# Patient Record
Sex: Female | Born: 1975 | Race: Black or African American | Hispanic: No | Smoking: Never smoker
Health system: Southern US, Community
[De-identification: ages and names within clinical notes are randomized; demographics above are authoritative.]

## PROBLEM LIST (undated history)

## (undated) DIAGNOSIS — F32A Depression, unspecified: Secondary | ICD-10-CM

## (undated) DIAGNOSIS — G43909 Migraine, unspecified, not intractable, without status migrainosus: Secondary | ICD-10-CM

## (undated) DIAGNOSIS — I639 Cerebral infarction, unspecified: Secondary | ICD-10-CM

## (undated) DIAGNOSIS — M722 Plantar fascial fibromatosis: Secondary | ICD-10-CM

## (undated) DIAGNOSIS — O009 Unspecified ectopic pregnancy without intrauterine pregnancy: Secondary | ICD-10-CM

## (undated) DIAGNOSIS — F419 Anxiety disorder, unspecified: Secondary | ICD-10-CM

## (undated) DIAGNOSIS — F4310 Post-traumatic stress disorder, unspecified: Secondary | ICD-10-CM

## (undated) DIAGNOSIS — F329 Major depressive disorder, single episode, unspecified: Secondary | ICD-10-CM

## (undated) DIAGNOSIS — G08 Intracranial and intraspinal phlebitis and thrombophlebitis: Secondary | ICD-10-CM

## (undated) DIAGNOSIS — M1990 Unspecified osteoarthritis, unspecified site: Secondary | ICD-10-CM

## (undated) HISTORY — DX: Cerebral infarction, unspecified: I63.9

---

## 1999-12-16 HISTORY — PX: ECTOPIC PREGNANCY SURGERY: SHX613

## 2000-10-15 HISTORY — PX: ECTOPIC PREGNANCY SURGERY: SHX613

## 2002-12-15 HISTORY — PX: ECTOPIC PREGNANCY SURGERY: SHX613

## 2004-01-16 HISTORY — PX: ECTOPIC PREGNANCY SURGERY: SHX613

## 2013-12-15 HISTORY — PX: COLONOSCOPY: SHX174

## 2014-03-27 ENCOUNTER — Encounter (HOSPITAL_COMMUNITY): Payer: Self-pay | Admitting: Emergency Medicine

## 2014-03-27 ENCOUNTER — Emergency Department (HOSPITAL_COMMUNITY)
Admission: EM | Admit: 2014-03-27 | Discharge: 2014-03-28 | Disposition: A | Discharge status: Other Acute Inpatient Hospital | Attending: Emergency Medicine | Admitting: Emergency Medicine

## 2014-03-27 DIAGNOSIS — R45851 Suicidal ideations: Secondary | ICD-10-CM

## 2014-03-27 DIAGNOSIS — F419 Anxiety disorder, unspecified: Secondary | ICD-10-CM

## 2014-03-27 DIAGNOSIS — F329 Major depressive disorder, single episode, unspecified: Secondary | ICD-10-CM

## 2014-03-27 DIAGNOSIS — F411 Generalized anxiety disorder: Secondary | ICD-10-CM | POA: Insufficient documentation

## 2014-03-27 DIAGNOSIS — Z3202 Encounter for pregnancy test, result negative: Secondary | ICD-10-CM | POA: Insufficient documentation

## 2014-03-27 DIAGNOSIS — Z8679 Personal history of other diseases of the circulatory system: Secondary | ICD-10-CM | POA: Insufficient documentation

## 2014-03-27 DIAGNOSIS — F32A Depression, unspecified: Secondary | ICD-10-CM

## 2014-03-27 DIAGNOSIS — A5903 Trichomonal cystitis and urethritis: Secondary | ICD-10-CM | POA: Insufficient documentation

## 2014-03-27 DIAGNOSIS — Z8739 Personal history of other diseases of the musculoskeletal system and connective tissue: Secondary | ICD-10-CM | POA: Insufficient documentation

## 2014-03-27 DIAGNOSIS — F8089 Other developmental disorders of speech and language: Secondary | ICD-10-CM | POA: Insufficient documentation

## 2014-03-27 DIAGNOSIS — F3289 Other specified depressive episodes: Secondary | ICD-10-CM | POA: Insufficient documentation

## 2014-03-27 HISTORY — DX: Major depressive disorder, single episode, unspecified: F32.9

## 2014-03-27 HISTORY — DX: Unspecified osteoarthritis, unspecified site: M19.90

## 2014-03-27 HISTORY — DX: Depression, unspecified: F32.A

## 2014-03-27 HISTORY — DX: Anxiety disorder, unspecified: F41.9

## 2014-03-27 HISTORY — DX: Unspecified ectopic pregnancy without intrauterine pregnancy: O00.90

## 2014-03-27 HISTORY — DX: Migraine, unspecified, not intractable, without status migrainosus: G43.909

## 2014-03-27 HISTORY — DX: Post-traumatic stress disorder, unspecified: F43.10

## 2014-03-27 MED ORDER — ONDANSETRON HCL 4 MG PO TABS: 4.0000 mg | ORAL_TABLET | Freq: Three times a day (TID) | ORAL | Status: DC | PRN

## 2014-03-27 MED ORDER — ALUM & MAG HYDROXIDE-SIMETH 200-200-20 MG/5ML PO SUSP: 30.0000 mL | ORAL | Status: DC | PRN

## 2014-03-27 MED ORDER — LORAZEPAM 1 MG PO TABS
1.0000 mg | ORAL_TABLET | Freq: Once | ORAL | Status: AC
  Administered 2014-03-28: 1 mg via ORAL
  Filled 2014-03-27: qty 1

## 2014-03-27 MED ORDER — ZOLPIDEM TARTRATE 5 MG PO TABS: 5.0000 mg | ORAL_TABLET | Freq: Every evening | ORAL | Status: DC | PRN

## 2014-03-27 MED ORDER — IBUPROFEN 200 MG PO TABS: 600.0000 mg | ORAL_TABLET | Freq: Three times a day (TID) | ORAL | Status: DC | PRN

## 2014-03-27 MED ORDER — NICOTINE 21 MG/24HR TD PT24: 21.0000 mg | MEDICATED_PATCH | Freq: Every day | TRANSDERMAL | Status: DC

## 2014-03-27 NOTE — ED Notes (Signed)
Patient states that she lost her licensee earlier today and then paniced - she repeatedly states that she is done and she is tired. The patient report SI denies any HI

## 2014-03-27 NOTE — ED Provider Notes (Signed)
CSN: [id number]     Arrival date & time 03/27/14  2258 History   First MD Initiated Contact with Patient 03/27/14 2305     This chart was scribed for non-physician practitioner, Antony MaduraKelly Enrico Eaddy, PA-C  working with Shon Batonourtney F Horton, MD by Arlan OrganAshley Leger, ED Scribe. This patient was seen in room WTR4/WLPT4 and the patient's care was started at 11:29 PM.   Chief Complaint  Patient presents with  . Panic Attack   HPI  HPI Comments: Jasmine [name] is a 38 y.o. Female with a PMHx of PTSD, depression, and mental issues who presents to the Emergency Department complaining of a panic attack that started a few hours prior to arrival.  She states this episode was brought on by a situation that occurred earlier today after getting her licensed taken away from her. Pt states she is "tired" and feels "confused". She currently lives with her Mother. Mother states she retired from [employer] about 3 weeks ago and was diagnosed with severe depression while active. Pt is not legally separated from her husband, but Mother states they are no longer together. She has been having some difficulty finding a job and looking for a new home. Pt states she stopped taking her medication in February. States her husband told her she was on an "emotional roller coaster". She has not been seen recently by a mental health provider; [place] follow up was in [place].[facility] but denies any HI at this time. Denies any recent alcohol or drug use. She admits she attempted suicide several years ago by consuming a large amount of prescribed medication. No other pertinent past medical history. No other concerns this visit.   Past Medical History  Diagnosis Date  . Ectopic pregnancy   . Migraine   . Osteoarthritis   . Anxiety   . PTSD (post-traumatic stress disorder)   . Depression    History reviewed. No pertinent past surgical history. No family history on file. History  Substance Use Topics  . Smoking status: Never Smoker   .  Smokeless tobacco: Not on file  . Alcohol Use: No   OB History   Grav Para Term Preterm Abortions TAB SAB Ect Mult Living                  Review of Systems  Constitutional: Negative for fever and chills.  HENT: Negative for congestion.   Eyes: Negative for redness.  Respiratory: Negative for cough.   Skin: Negative for rash.  Psychiatric/Behavioral: Positive for suicidal ideas. The patient is nervous/anxious.      Allergies  Review of patient's allergies indicates no known allergies.  Home Medications   Current Outpatient Rx  Name  Route  Sig  Dispense  Refill  . ibuprofen (ADVIL,MOTRIN) 200 MG tablet   Oral   Take 400 mg by mouth every 6 (six) hours as needed (pain).           Triage Vitals: BP 126/99  Pulse 88  Temp(Src) 98 F (36.7 C) (Oral)  Resp 18    Physical Exam  Nursing note and vitals reviewed. Constitutional: She is oriented to person, place, and time. She appears well-developed and well-nourished. No distress.  HENT:  Head: Normocephalic and atraumatic.  Eyes: Conjunctivae and EOM are normal. No scleral icterus.  Neck: Normal range of motion.  Cardiovascular: Normal rate, regular rhythm and normal heart sounds.   Pulmonary/Chest: Effort normal and breath sounds normal. No respiratory distress. She has no wheezes. She  has no rales.  Abdominal: Soft. She exhibits no distension. There is no tenderness. There is no rebound.  Musculoskeletal: Normal range of motion.  Neurological: She is alert and oriented to person, place, and time.  Skin: Skin is warm and dry. No rash noted. She is not diaphoretic. No erythema. No pallor.  Psychiatric: Her speech is delayed. She is withdrawn. She exhibits a depressed mood. She expresses suicidal ideation. She expresses no homicidal ideation. She expresses no suicidal plans and no homicidal plans.  Patient quiet, reserved, and tearful.    ED Course  Procedures (including critical care time)  COORDINATION OF  CARE: 11:37 PM-Discussed treatment plan with pt at bedside and pt agreed to plan.     Labs Review Labs Reviewed  COMPREHENSIVE METABOLIC PANEL - Abnormal; Notable for the following:    Potassium 3.4 (*)    GFR calc non Af Amer 90 (*)    All other components within normal limits  URINALYSIS, ROUTINE W REFLEX MICROSCOPIC - Abnormal; Notable for the following:    APPearance CLOUDY (*)    Hgb urine dipstick LARGE (*)    Leukocytes, UA MODERATE (*)    All other components within normal limits  SALICYLATE LEVEL - Abnormal; Notable for the following:    Salicylate Lvl <2.0 (*)    All other components within normal limits  URINE MICROSCOPIC-ADD ON - Abnormal; Notable for the following:    Squamous Epithelial / LPF FEW (*)    Bacteria, UA FEW (*)    All other components within normal limits  CBC WITH DIFFERENTIAL  PREGNANCY, URINE  ETHANOL  URINE RAPID DRUG SCREEN (HOSP PERFORMED)  ACETAMINOPHEN LEVEL   Imaging Review No results found.   EKG Interpretation None      MDM   Final diagnoses:  Depression  Suicidal ideations  Anxiety  Trichomonal urethritis    38 year old female presents for anxiety, depression, and suicidal ideations. Patient tearful and withdrawn on initial presentation. Workup today significant for trichomonal urethritis. Patient treated for this in the ED. She is otherwise medically cleared. Patient has been accepted at behavioral health for inpatient psychiatric care by Dr. Elsie SaasJonnalagadda. EMTALA completed and patient stable for transfer.  I personally performed the services described in this documentation, which was scribed in my presence. The recorded information has been reviewed and is accurate.    Antony MaduraKelly Sareena Odeh, PA-C 03/28/14 [phone]

## 2014-03-28 ENCOUNTER — Encounter (HOSPITAL_COMMUNITY): Payer: Self-pay | Admitting: *Deleted

## 2014-03-28 ENCOUNTER — Inpatient Hospital Stay (HOSPITAL_COMMUNITY)
Admission: EM | Admit: 2014-03-28 | Discharge: 2014-04-03 | DRG: 885 | Disposition: A | Discharge status: Home / Self Care | Source: Intra-hospital | Attending: Psychiatry | Admitting: Psychiatry

## 2014-03-28 DIAGNOSIS — G4700 Insomnia, unspecified: Secondary | ICD-10-CM | POA: Diagnosis present

## 2014-03-28 DIAGNOSIS — F411 Generalized anxiety disorder: Secondary | ICD-10-CM | POA: Diagnosis present

## 2014-03-28 DIAGNOSIS — F332 Major depressive disorder, recurrent severe without psychotic features: Principal | ICD-10-CM

## 2014-03-28 DIAGNOSIS — F329 Major depressive disorder, single episode, unspecified: Secondary | ICD-10-CM

## 2014-03-28 DIAGNOSIS — F4310 Post-traumatic stress disorder, unspecified: Secondary | ICD-10-CM

## 2014-03-28 DIAGNOSIS — F410 Panic disorder [episodic paroxysmal anxiety] without agoraphobia: Secondary | ICD-10-CM | POA: Diagnosis present

## 2014-03-28 DIAGNOSIS — G4710 Hypersomnia, unspecified: Secondary | ICD-10-CM | POA: Diagnosis present

## 2014-03-28 DIAGNOSIS — R45851 Suicidal ideations: Secondary | ICD-10-CM

## 2014-03-28 DIAGNOSIS — M1990 Unspecified osteoarthritis, unspecified site: Secondary | ICD-10-CM | POA: Diagnosis present

## 2014-03-28 LAB — URINALYSIS, ROUTINE W REFLEX MICROSCOPIC
Bilirubin Urine: NEGATIVE
Glucose, UA: NEGATIVE mg/dL
Ketones, ur: NEGATIVE mg/dL
NITRITE: NEGATIVE
PROTEIN: NEGATIVE mg/dL
Specific Gravity, Urine: 1.015 (ref 1.005–1.030)
Urobilinogen, UA: 0.2 mg/dL (ref 0.0–1.0)
pH: 6 (ref 5.0–8.0)

## 2014-03-28 LAB — COMPREHENSIVE METABOLIC PANEL
ALT: 18 U/L (ref 0–35)
AST: 18 U/L (ref 0–37)
Albumin: 4.1 g/dL (ref 3.5–5.2)
Alkaline Phosphatase: 70 U/L (ref 39–117)
BILIRUBIN TOTAL: 0.3 mg/dL (ref 0.3–1.2)
BUN: 12 mg/dL (ref 6–23)
CHLORIDE: 101 meq/L (ref 96–112)
CO2: 22 mEq/L (ref 19–32)
Calcium: 9.7 mg/dL (ref 8.4–10.5)
Creatinine, Ser: 0.81 mg/dL (ref 0.50–1.10)
GFR calc non Af Amer: 90 mL/min — ABNORMAL LOW (ref >90)
GLUCOSE: 95 mg/dL (ref 70–99)
POTASSIUM: 3.4 meq/L — AB (ref 3.7–5.3)
Sodium: 138 mEq/L (ref 137–147)
Total Protein: 8.2 g/dL (ref 6.0–8.3)

## 2014-03-28 LAB — CBC WITH DIFFERENTIAL/PLATELET
BASOS PCT: 0 % (ref 0–1)
Basophils Absolute: 0 10*3/uL (ref 0.0–0.1)
Eosinophils Absolute: 0.2 10*3/uL (ref 0.0–0.7)
Eosinophils Relative: 3 % (ref 0–5)
HCT: 39.3 % (ref 36.0–46.0)
HEMOGLOBIN: 13.6 g/dL (ref 12.0–15.0)
LYMPHS ABS: 3.5 10*3/uL (ref 0.7–4.0)
LYMPHS PCT: 45 % (ref 12–46)
MCH: 30.6 pg (ref 26.0–34.0)
MCHC: 34.6 g/dL (ref 30.0–36.0)
MCV: 88.5 fL (ref 78.0–100.0)
MONOS PCT: 4 % (ref 3–12)
Monocytes Absolute: 0.3 10*3/uL (ref 0.1–1.0)
NEUTROS ABS: 3.7 10*3/uL (ref 1.7–7.7)
Neutrophils Relative %: 48 % (ref 43–77)
Platelets: 321 10*3/uL (ref 150–400)
RBC: 4.44 MIL/uL (ref 3.87–5.11)
RDW: 13.5 % (ref 11.5–15.5)
WBC: 7.7 10*3/uL (ref 4.0–10.5)

## 2014-03-28 LAB — URINE MICROSCOPIC-ADD ON

## 2014-03-28 LAB — SALICYLATE LEVEL

## 2014-03-28 LAB — PREGNANCY, URINE: Preg Test, Ur: NEGATIVE

## 2014-03-28 LAB — RAPID URINE DRUG SCREEN, HOSP PERFORMED
Amphetamines: NOT DETECTED
BARBITURATES: NOT DETECTED
Benzodiazepines: NOT DETECTED
COCAINE: NOT DETECTED
Opiates: NOT DETECTED
TETRAHYDROCANNABINOL: NOT DETECTED

## 2014-03-28 LAB — ACETAMINOPHEN LEVEL

## 2014-03-28 LAB — ETHANOL: Alcohol, Ethyl (B): <11 mg/dL (ref 0–11)

## 2014-03-28 MED ORDER — METRONIDAZOLE 500 MG PO TABS
2000.0000 mg | ORAL_TABLET | Freq: Once | ORAL | Status: AC
  Administered 2014-03-28: 2000 mg via ORAL
  Filled 2014-03-28: qty 4

## 2014-03-28 MED ORDER — ALUM & MAG HYDROXIDE-SIMETH 200-200-20 MG/5ML PO SUSP: 30.0000 mL | ORAL | Status: DC | PRN

## 2014-03-28 MED ORDER — CELECOXIB 100 MG PO CAPS
200.0000 mg | ORAL_CAPSULE | Freq: Every day | ORAL | Status: DC
  Administered 2014-03-28 – 2014-04-03 (×7): 200 mg via ORAL
  Filled 2014-03-28: qty 1
  Filled 2014-03-28: qty 28
  Filled 2014-03-28 (×2): qty 1
  Filled 2014-03-28: qty 2
  Filled 2014-03-28 (×5): qty 1

## 2014-03-28 MED ORDER — MAGNESIUM HYDROXIDE 400 MG/5ML PO SUSP: 30.0000 mL | Freq: Every day | ORAL | Status: DC | PRN

## 2014-03-28 MED ORDER — TRAZODONE HCL 50 MG PO TABS
50.0000 mg | ORAL_TABLET | Freq: Every evening | ORAL | Status: DC | PRN
  Administered 2014-03-28 – 2014-03-31 (×5): 50 mg via ORAL
  Filled 2014-03-28 (×17): qty 1

## 2014-03-28 MED ORDER — ESCITALOPRAM OXALATE 10 MG PO TABS
10.0000 mg | ORAL_TABLET | Freq: Every day | ORAL | Status: DC
  Administered 2014-03-28 – 2014-04-03 (×7): 10 mg via ORAL
  Filled 2014-03-28 (×8): qty 1
  Filled 2014-03-28: qty 14
  Filled 2014-03-28 (×2): qty 1

## 2014-03-28 MED ORDER — HYDROXYZINE HCL 25 MG PO TABS
25.0000 mg | ORAL_TABLET | Freq: Four times a day (QID) | ORAL | Status: DC | PRN
  Administered 2014-04-01 – 2014-04-02 (×2): 25 mg via ORAL
  Filled 2014-03-28 (×2): qty 1

## 2014-03-28 MED ORDER — ACETAMINOPHEN 325 MG PO TABS: 650.0000 mg | ORAL_TABLET | Freq: Four times a day (QID) | ORAL | Status: DC | PRN

## 2014-03-28 NOTE — Progress Notes (Signed)
The focus of this group is to educate the patient on the purpose and policies of crisis stabilization and provide a format to answer questions about their admission.  The group details unit policies and expectations of patients while admitted. Patient did not attend. 

## 2014-03-28 NOTE — Tx Team (Signed)
Initial Interdisciplinary Treatment Plan  PATIENT STRENGTHS: (choose at least two) Average or above average intelligence Capable of independent living General fund of knowledge Supportive family/friends  PATIENT STRESSORS: Financial difficulties Marital or family conflict Medication change or noncompliance Occupational concerns   PROBLEM LIST: Problem List/Patient Goals Date to be addressed Date deferred Reason deferred Estimated date of resolution  Depression-separation from husband x 1 month      Feeling overwhelmed, not able to find a job-"I'm on an emotional roller coaster"      Risk for self harm                                           DISCHARGE CRITERIA:  Improved stabilization in mood, thinking, and/or behavior Motivation to continue treatment in a less acute level of care Need for constant or close observation no longer present Reduction of life-threatening or endangering symptoms to within safe limits Verbal commitment to aftercare and medication compliance  PRELIMINARY DISCHARGE PLAN: Attend aftercare/continuing care group Attend PHP/IOP Outpatient therapy Return to previous living arrangement  PATIENT/FAMIILY INVOLVEMENT: This treatment plan has been presented to and reviewed with the patient, Jasmine [name], and/or family member.  The patient and family have been given the opportunity to ask questions and make suggestions.  [name] [name] 03/28/2014, 6:00 AM

## 2014-03-28 NOTE — [facility] Suicide Risk Assessment (Signed)
   Nursing information obtained from:  Patient;Review of record Demographic factors:  Low socioeconomic status;Unemployed (separated from husband) Current Mental Status:  Self-harm thoughts Loss Factors:  Decrease in vocational status;Loss of significant relationship;Financial problems / change in socioeconomic status Historical Factors:  Prior suicide attempts Risk Reduction Factors:  Living with another person, especially a relative;Positive social support Total Time spent with patient: 45 minutes  CLINICAL FACTORS:   Severe Anxiety and/or Agitation Panic Attacks Depression:   Anhedonia Hopelessness Impulsivity Insomnia Recent sense of peace/wellbeing Severe Unstable or Poor Therapeutic Relationship Previous Psychiatric Diagnoses and Treatments Medical Diagnoses and Treatments/Surgeries  Psychiatric Specialty Exam: Physical Exam  ROS  Blood pressure 107/71, pulse 82, temperature 98.2 F (36.8 C), temperature source Oral, resp. rate 18, height 5\' 1"  (1.549 m), weight 79.606 kg (175 lb 8 oz), last menstrual period 03/23/2014.Body mass index is 33.18 kg/(m^2).  General Appearance: Disheveled and Guarded  Eye Contact::  Minimal  Speech:  Slow  Volume:  Decreased  Mood:  Anxious, Depressed, Hopeless and Worthless  Affect:  Depressed and Restricted  Thought Process:  Intact  Orientation:  Full (Time, Place, and Person)  Thought Content:  Rumination  Suicidal Thoughts:  Yes.  without intent/plan  Homicidal Thoughts:  No  Memory:  Immediate;   Good  Judgement:  Impaired  Insight:  Lacking  Psychomotor Activity:  Psychomotor Retardation  Concentration:  Fair  Recall:  Fair  Fund of Knowledge:Fair  Language: Good  Akathisia:  NA  Handed:  Right  AIMS (if indicated):     Assets:  Communication Skills Desire for Improvement Financial Resources/Insurance Housing Leisure Time Physical Health Resilience Social Support Talents/Skills Transportation  Sleep:       Musculoskeletal: Strength & Muscle Tone: within normal limits Gait & Station: normal Patient leans: N/A  COGNITIVE FEATURES THAT CONTRIBUTE TO RISK:  Closed-mindedness Loss of executive function Polarized thinking    SUICIDE RISK:   Moderate:  Frequent suicidal ideation with limited intensity, and duration, some specificity in terms of plans, no associated intent, good self-control, limited dysphoria/symptomatology, some risk factors present, and identifiable protective factors, including available and accessible social support.  PLAN OF CARE: Admit for crisis stabilization, safety monitoring and medication management of depression and posttraumatic stress disorder.   I certify that inpatient services furnished can reasonably be expected to improve the patient's condition.  Jasmine [name] 03/28/2014, 1:22 PM

## 2014-03-28 NOTE — ED Notes (Signed)
Mother: Velma SwazilandJordan    [phone] Sister Debbe OdeaLatisha stacker [phone] Password: Maurice Marchallahboy

## 2014-03-28 NOTE — Clinical Social Work Note (Signed)
Writer met with patient's sister in lobby to gather collateral information and to provide SPE.  Patient husband was waiting in lobby but sister advised CSW was not given consent to talk with husband. 

## 2014-03-28 NOTE — ED Notes (Signed)
Report called to RN Erskine SquibbJane, [facility].  Pending [facility] transport.
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
## 2014-03-28 NOTE — Progress Notes (Signed)
Recreation Therapy Notes  Animal-Assisted Activity/Therapy (AAA/T) Program Checklist/Progress Notes Patient Eligibility Criteria Checklist & Daily Group note for Rec Tx Intervention  Date: 04.14.2015 Time: 2:45am Location: 500 Hall Dayroom    AAA/T Program Assumption of Risk Form signed by Patient/ or Parent Legal Guardian yes  Patient is free of allergies or sever asthma yes  Patient reports no fear of animals yes  Patient reports no history of cruelty to animals yes   Patient understands his/her participation is voluntary yes  Behavioral Response: Did not attend.   Dao Memmott L Audriana Aldama, LRT/CTRS  Dash Cardarelli L Hadlei Stitt 03/28/2014 4:55 PM 

## 2014-03-28 NOTE — [facility] Group Notes (Signed)
[facility] LCSW Group Therapy      Feelings About Diagnosis 1:15 - 2:30 PM         03/28/2014  3:34 PM    Type of Therapy:  Group Therapy  Participation Level: did not attend group.  Wynn BankerHodnett, Taneshia Lorence Hairston 03/28/2014  3:34 PM

## 2014-03-28 NOTE — Progress Notes (Signed)
D. Pt visible and in room for much of the the afternoon, did have visit from family members this evening. Pt appears depressed and sad. Pt did receive medication without incident and pt did not complain of any pain this evening. A. Support and encouragement provided, medication education provided. R. Pt verbalized understanding, safety maintained.

## 2014-03-28 NOTE — [facility] Suicide Risk Assessment (Signed)
[facility] INPATIENT:  Family/Significant Other Suicide Prevention Education  Suicide Prevention Education:  Education Completed; Jasmine [name], Sister, [facility] Lobby of [facility] [phone] has been identified by the patient as the family member/significant other with whom the patient will be residing, and identified as the person(s) who will aid the patient in the event of a mental health crisis (suicidal ideations/suicide attempt).  With written consent from the patient, the family member/significant other has been provided the following suicide prevention education, prior to the and/or following the discharge of the patient.  The suicide prevention education provided includes the following:  Suicide risk factors  Suicide prevention and interventions  National Suicide Hotline telephone number  [facility] assessment telephone number  [facility] Emergency Assistance 911  [facility] and/or Residential Mobile Crisis Unit telephone number  Request made of family/significant other to:  Remove weapons (e.g., guns, rifles, knives), all items previously/currently identified as safety concern.  Sister advised patient does not have access to weapons.   Remove drugs/medications (over-the-counter, prescriptions, illicit drugs), all items previously/currently identified as a safety concern.  The family member/significant other verbalizes understanding of the suicide prevention education information provided.  The family member/significant other agrees to remove the items of safety concern listed above.  Jasmine [name] 03/28/2014, 1:16 PM

## 2014-03-28 NOTE — Progress Notes (Signed)
Vol admit to the 500 hall after presenting to the ED with depression and expressing SI to overdose.  Pt says she has attempted suicide in the past by overdose when she was stationed in [place] while in the [employer].  Pt says she has been feeling tired and confused.  Pt says she has been separated from her husband about a month.  Pt is living with her mother.  Pt retired from the [employer] about 3 weeks ago after serving in [employer] for 20 yrs.  Pt says she has been unable to find a job.  She feels like she is on an emotional roller coaster.  Her depression and frustration had been building until she could find her license and she "just lost it".  Pt says she still feels suicidal, but can contract for safety to come to staff if she has thoughts to hurt herself.  Pt was cooperative with the admission and paperwork was signed.  Pt was quiet/withdrawn,but answered questions appropriately.  Pt denies substance abuse issues.  No major medical issues.  Pt reports a hx of three ectopic pregnancies in the early 2000s.  Pt was oriented to the unit.  Safety checks q15 minutes initiated.

## 2014-03-28 NOTE — ED Notes (Signed)
[facility] transport requested. 
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
## 2014-03-29 LAB — LIPID PANEL
CHOL/HDL RATIO: 2.8 ratio
Cholesterol: 160 mg/dL (ref 0–200)
HDL: 57 mg/dL (ref >39)
LDL CALC: 93 mg/dL (ref 0–99)
TRIGLYCERIDES: 51 mg/dL (ref <150)
VLDL: 10 mg/dL (ref 0–40)

## 2014-03-29 LAB — T4, FREE: Free T4: 1.14 ng/dL (ref 0.80–1.80)

## 2014-03-29 LAB — TSH: TSH: 2.05 u[IU]/mL (ref 0.350–4.500)

## 2014-03-29 NOTE — ED Provider Notes (Signed)
Medical screening examination/treatment/procedure(s) were performed by non-physician practitioner and as supervising physician I was immediately available for consultation/collaboration.   EKG Interpretation None        Courtney F Horton, MD 03/29/14 0442 
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
## 2014-03-29 NOTE — Progress Notes (Signed)
D: Patient denies SI/HI and auditory and visual hallucinations. The patient has a depressed mood and affect. The patient forwards little on the unit but is attending groups and interacting with peers appropriately.  A: Patient given emotional support from RN. Patient encouraged to come to staff with concerns and/or questions. Patient's medication routine continued. Patient's orders and plan of care reviewed.  R: Patient remains appropriate and cooperative. Will continue to monitor patient q15 minutes for safety.

## 2014-03-29 NOTE — Progress Notes (Signed)
Adult Psychoeducational Group Note  Date:  03/28/2014 Time:  11:40 PM  Group Topic/Focus:  Wrap-Up Group:   The focus of this group is to help patients review their daily goal of treatment and discuss progress on daily workbooks.  Participation Level:  Active  Participation Quality:  Appropriate  Affect:  Appropriate  Cognitive:  Appropriate  Insight: Appropriate  Engagement in Group:  Engaged  Modes of Intervention:  Discussion  Additional Comments:  Pt was present for wrap up group. Patient stated that she had not learned anything yet, she had just gotten here. She said that she saw her family today- her mother and step father. She said that they are positive supports for her.  Avanish Cerullo A Charlestine Rookstool 03/28/2014, 11:40 PM

## 2014-03-29 NOTE — [facility] Group Notes (Signed)
[facility] LCSW Group Therapy  Emotional Regulation 1:15 - 2: 30 PM        03/29/2014     Type of Therapy:  Group Therapy  Participation Level:  Appropriate  Participation Quality:  Appropriate  Affect:  Appropriate  Cognitive:  Attentive Appropriate  Insight:  Developing/Improving Engaged  Engagement in Therapy:  Developing/Improving Engaged  Modes of Intervention:  Discussion Exploration Problem-Solving Supportive  Summary of Progress/Problems:  Group topic was emotional regulations.  Patient participated in the discussion and was able to identify an emotion that needed to regulated.  She shared she has to learn how to control frustration due to setting high expectation from others and them not meeting that expectation.  Patient was able to identify approprite coping skills.  Wynn BankerHodnett, Leyana Whidden Hairston 03/29/2014

## 2014-03-29 NOTE — Progress Notes (Signed)
[facility] MD Progress Note  03/29/2014 1:43 PM Jasmine [name]  MRN:  [id number] Subjective:  Met with the patient to discuss her plan of care and response to treatment. The patient is up and active on the unit and attending groups. She reports she is better, but is not "where she needs to be in terms of symptom reduction."  Objective: Errica is alert and oriented on the unit, iterracting with her peers appropriately. She is making better eye contact, is less guarded, her voice is a little stronger, and she does actually smile today. She appears much more at ease in her surroundings and is asking about going home. 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 rate 16, height _0  (1.549 m), weight 79.606 kg (175 lb 8 oz), last menstrual period 03/23/2014.Body mass index is 33.18 kg/(m^2).  General Appearance: Fairly Groomed  Engineer, water::  Fair  Speech:  Clear and Coherent  Volume:  Normal  Mood:  Depressed  Affect:  Congruent  Thought Process:  Goal Directed  Orientation:  Full (Time, Place, and Person)  Thought Content:  WDL  Suicidal Thoughts:  Yes.  without intent/plan  Homicidal Thoughts:  No  Memory:  NA  Judgement:  Poor  Insight:  Present  Psychomotor Activity:  Decreased  Concentration:  Poor  Recall:  Poor  Fund of Knowledge:Fair  Language: Good  Akathisia:  No  Handed:  Right  AIMS (if indicated):     Assets:   Communication Skills Desire for Improvement Financial Resources/Insurance Physical Health Resilience Social Support  Sleep:      Musculoskeletal: Strength & Muscle Tone: within normal limits Gait & Station: normal Patient leans: N/A  Current Medications: Current Facility-Administered Medications  Medication Dose Route Frequency Provider Last Rate Last Dose  . acetaminophen (TYLENOL) tablet 650 mg  650 mg Oral Q6H PRN Laverle Hobby, PA-C      . alum & mag hydroxide-simeth (MAALOX/MYLANTA) 200-200-20 MG/5ML suspension 30 mL  30 mL Oral Q4H PRN Laverle Hobby, PA-C      . celecoxib (CELEBREX) capsule 200 mg  200 mg Oral Daily Nena Polio, PA-C   200 mg at 03/29/14 0831  . escitalopram (LEXAPRO) tablet 10 mg  10 mg Oral Daily Nena Polio, PA-C   10 mg at 03/29/14 5465  . hydrOXYzine (ATARAX/VISTARIL) tablet 25 mg  25 mg Oral Q6H PRN Laverle Hobby, PA-C      . magnesium hydroxide (MILK OF MAGNESIA) suspension 30 mL  30 mL Oral Daily PRN Laverle Hobby, PA-C      . traZODone (DESYREL) tablet 50 mg  50 mg Oral QHS,MR X 1 Laverle Hobby, PA-C   50 mg at 03/28/14 2129    Lab Results:  Results for orders placed during the hospital encounter of 03/28/14 (from the past 48 hour(s))  TSH     Status: None   Collection Time    03/29/14  6:35 AM      Result Value  Ref Range   TSH 2.050  0.350 - 4.500 uIU/mL   Comment: Please note change in reference range.     Performed at [facility]  LIPID PANEL     Status: None   Collection Time    03/29/14  6:35 AM      Result Value Ref Range   Cholesterol 160  0 - 200 mg/dL   Triglycerides 51  <150 mg/dL   HDL 57  >39 mg/dL   Total CHOL/HDL Ratio 2.8     VLDL 10  0 - 40 mg/dL   LDL Cholesterol 93  0 - 99 mg/dL   Comment:            Total Cholesterol/HDL:CHD Risk     Coronary Heart Disease Risk Table                         Men   Women      1/2 Average Risk   3.4   3.3      Average Risk       5.0   4.4      2 X Average Risk    9.6   7.1      3 X Average Risk  23.4   11.0                Use the calculated Patient Ratio     above and the CHD Risk Table     to determine the patient's CHD Risk.                ATP III CLASSIFICATION (LDL):      <100     mg/dL   Optimal      100-129  mg/dL   Near or Above                        Optimal      130-159  mg/dL   Borderline      160-189  mg/dL   High      >190     mg/dL   Very High     Performed at [facility]    Physical Findings: AIMS: Facial and Oral Movements Muscles of Facial Expression: None, normal Lips and Perioral Area: None, normal Jaw: None, normal Tongue: None, normal,Extremity Movements Upper (arms, wrists, hands, fingers): None, normal Lower (legs, knees, ankles, toes): None, normal, Trunk Movements Neck, shoulders, hips: None, normal, Overall Severity Severity of abnormal movements (highest score from questions above): None, normal Incapacitation due to abnormal movements: None, normal Patient's awareness of abnormal movements (rate only patient's report): No Awareness, Dental Status Current problems with teeth and/or dentures?: No Does patient usually wear dentures?: No  CIWA:    COWS:     Treatment Plan Summary: Daily contact with patient to assess and evaluate symptoms and progress in treatment Medication management  Plan:1. Continue crisis management and stabilization. 2. Medication management to reduce current symptoms to base line and improve the patient's overall level of functioning. 3. Treat health problems as indicated. 4. Develop treatment plan to decrease risk of relapse upon discharge and to reduce the need for readmission. 5. Psycho-social education regarding relapse prevention and self care. 6. Health care follow up as needed for medical problems. 7. Restart home medications where appropriate.   Medical Decision Making Problem Points:  Established problem, stable/improving (1) Data Points:  Review or order medicine  tests (1)  I certify that inpatient services furnished can reasonably be expected to improve the patient's condition.   Nena Polio 03/29/2014, 1:43 PM  Reviewed the information documented and agree with the treatment plan.  Parke Simmers Annalicia Renfrew 03/30/2014 3:41 PM

## 2014-03-29 NOTE — Progress Notes (Signed)
Adult Psychoeducational Group Note  Date:  03/29/2014 Time:  10:14 PM  Group Topic/Focus:  Wrap-Up Group:   The focus of this group is to help patients review their daily goal of treatment and discuss progress on daily workbooks.  Participation Level:  Minimal  Participation Quality:  Appropriate  Affect:  Appropriate  Cognitive:  Alert  Insight: Appropriate  Engagement in Group:  Lacking  Modes of Intervention:  Discussion  Additional Comments:  Pt stated that she had a better day. She has been able to process some things and talk with family. Her goal for tomorrow is to just take life one day at a time and enjoy the process.  Flonnie HailstoneShalonda R Afifa Truax 03/29/2014, 10:14 PM

## 2014-03-29 NOTE — Progress Notes (Signed)
Adult Psychoeducational Group Note  Date:  03/29/2014 Time:  10:00am Group Topic/Focus:  Personal Choices and Values:   The focus of this group is to help patients assess and explore the importance of values in their lives, how their values affect their decisions, how they express their values and what opposes their expression.  Participation Level:  Active  Participation Quality:  Appropriate and Attentive  Affect:  Appropriate  Cognitive:  Alert and Appropriate  Insight: Appropriate  Engagement in Group:  Engaged  Modes of Intervention:  Discussion and Education  Additional Comments:  Pt attended and participated in group. Discussion was on personal development. Pt was asked what did personal development to you? Pt stated it means cope with life and setting boundaries.  Pryor Curiaanya D Garner 03/29/2014, 2:11 PM

## 2014-03-30 NOTE — [facility] Group Notes (Signed)
[facility] LCSW Group Therapy  [facility] 1:15 - 2:30 PM  03/30/2014   Type of Therapy:  Group Therapy  Participation Level: Active  Participation Quality:  Attentive  Affect:  Appropriate  Cognitive:  Appropriate  Insight:  Developing/Improving and Engaged  Engagement in Therapy:  Developing/Improving Engaged  Modes of Intervention:  Discussion, Education, Exploration, Problem-Solving, Rapport Building, Support   Summary of Progress/Problems:  Patient listened attentively to speaker from [employer]. She asked questions about speaker's story and shared she is interested in participating with [facility].  Wynn BankerHodnett, Nat Lowenthal Hairston 03/30/2014 3:45 PM

## 2014-03-30 NOTE — Progress Notes (Signed)
D: Patient's affect and mood is depressed. She reported on the self inventory sheet that she's sleeping well, appetite is improving, energy level is low and ability to pay attention is good. Patient rates depression "6" and feelings of hopelessness "0". She's actively participating in groups throughout the day. Patient is compliant with medications.  A: Support and encouragement provided to patient. Administered scheduled medications per ordering MD. Monitor Q15 minute checks for safety.  R: Patient receptive. Denies SI/HI and auditory/visual hallucinations. Patient remains safe on the unit.

## 2014-03-30 NOTE — Progress Notes (Signed)
Recreation Therapy Notes  Animal-Assisted Activity/Therapy (AAA/T) Program Checklist/Progress Notes Patient Eligibility Criteria Checklist & Daily Group note for Rec Tx Intervention  Date: 04.16.2015 Time: 2:45pm Location: [address] Dayroom   AAA/T Program Assumption of Risk Form signed by Patient/ or Parent Legal Guardian yes  Patient is free of allergies or sever asthma yes  Patient reports no fear of animals yes  Patient reports no history of cruelty to animals yes   Patient understands his/her participation is voluntary yes  Patient washes hands before animal contact yes  Patient washes hands after animal contact yes  Behavioral Response: Appropriate   Education: Hand Washing, Appropriate Animal Interaction   Education Outcome: Acknowledges understanding  Clinical Observations/Feedback: Patient interacted appropriately with therapeutic dog team, peers and LRT.   Jayan Raymundo L Tay Whitwell, LRT/CTRS  Linard Daft L Yasamin Karel 03/30/2014 4:13 PM 

## 2014-03-30 NOTE — Progress Notes (Signed)
The patient attended group and was appropriate.  

## 2014-03-30 NOTE — Progress Notes (Signed)
Adult Psychoeducational Group Note  Date:  03/30/2014 Time:  9:00 AM  Group Topic/Focus:  Morning Wellness Group  Participation Level:  Active  Participation Quality:  Appropriate and Attentive  Affect:  Appropriate  Cognitive:  Alert and Oriented  Insight: Good  Engagement in Group:  Engaged  Modes of Intervention:  Discussion  Additional Comments: Patient participated in the warm-up exercises.  Melanee SpryRonecia E Byrd 03/30/2014, 11:46 AM

## 2014-03-30 NOTE — Progress Notes (Signed)
Adult Psychoeducational Group Note  Date:  03/30/2014 Time:  10:00am Group Topic/Focus:  Making Healthy Choices:   The focus of this group is to help patients identify negative/unhealthy choices they were using prior to admission and identify positive/healthier coping strategies to replace them upon discharge.  Participation Level:  Active  Participation Quality:  Appropriate and Attentive  Affect:  Appropriate  Cognitive:  Alert and Appropriate  Insight: Appropriate  Engagement in Group:  Engaged  Modes of Intervention:  Discussion and Education  Additional Comments:   Pt. Attended and participated in group. Group discussion was on Leisure and lifestyle changes. The question was asked what is one change you could make to improve your lifestyle? Pt stated to rethink things and come up with a new strategy.   Pryor Curiaanya D Garner 03/30/2014, 11:53 AM

## 2014-03-30 NOTE — Progress Notes (Signed)
D - Pt alert and oriented x 4, active on the unit interacting with peers and staff appropriately. Pt denies SI/HI or any auditory or visual hallucinations. Pt attending groups and actively participating.  A - Therapeutic conversation and emotional support offered. Encouraged pt to speak with staff about any questions or concerns. Medications given as ordered.  R - Pt safety maintained with Q 15 minute checks. Pt remains safe on the unit.

## 2014-03-30 NOTE — Progress Notes (Signed)
Patient at the window at the beginning of this shift. She denied SI/HI and denied Hallucinations. Patient's mood and affect seemed appropriate. She reported that she is feeling better, attending group and denied any problem.  Writer encouraged and supported patient. Patient receptive to encouragement and support. Q 15 minute check continues as ordered to maintain safety.

## 2014-03-30 NOTE — Progress Notes (Signed)
Patient ID: Jasmine [name], female   DOB: [birth year], 38 y.o.   MRN: [id number] [facility]  03/30/2014 1:51 PM Jasmine [name]  MRN:  [id number]  Subjective:  Patient was seen today face-to-face. Patient was admitted with diagnosis of major depressive disorder. Patient has been compliant with medication management and also unit activities including therapeutic groups. Patient reported she has no adverse effect of the medication. Patient reported she has a disturbance of sleep and appetite. Patient slowly improving her appetite and her sleep was better with medication. Patient minimizes her anxiety but continued to have a depression rated 3-4/10 today. Patient interacting with staff and her peers appropriately. She is making better eye contact, is less guarded, her voice is a little stronger, and has a bright affect. She appears much more at ease in her surroundings and is asking about going home.  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 rate 16, height 5\' 1"  (1.549 m), weight 79.606 kg (175 lb 8 oz), last menstrual period 03/23/2014.Body mass index is 33.18 kg/(m^2).  General Appearance: Fairly Groomed  Patent attorneyye Contact::  Fair  Speech:  Clear and Coherent  Volume:  Normal  Mood:  Depressed  Affect:  Congruent  Thought Process:  Goal Directed  Orientation:  Full (Time, Place, and Person)  Thought Content:  WDL   Suicidal Thoughts:  Yes.  without intent/plan  Homicidal Thoughts:  No  Memory:  NA  Judgement:  Poor  Insight:  Present  Psychomotor Activity:  Decreased  Concentration:  Poor  Recall:  Poor  Fund of Knowledge:Fair  Language: Good  Akathisia:  No  Handed:  Right  AIMS (if indicated):     Assets:  Communication Skills Desire for Improvement Financial Resources/Insurance Physical Health Resilience Social Support  Sleep:  Number of Hours: 6.75   Musculoskeletal: Strength & Muscle Tone: within normal limits Gait & Station: normal Patient leans: N/A  Current Medications: Current Facility-Administered Medications  Medication Dose Route Frequency Provider Last Rate Last Dose  . acetaminophen (TYLENOL) tablet 650 mg  650 mg Oral Q6H PRN Kerry HoughSpencer E Simon, PA-C      . alum & mag hydroxide-simeth (MAALOX/MYLANTA) 200-200-20 MG/5ML suspension 30 mL  30 mL Oral Q4H PRN Kerry HoughSpencer E Simon, PA-C      . celecoxib (CELEBREX) capsule 200 mg  200 mg Oral Daily Verne SpurrNeil Mashburn, PA-C   200 mg at 03/30/14 [id number]  . escitalopram (LEXAPRO) tablet 10 mg  10 mg Oral Daily Verne SpurrNeil Mashburn, PA-C   10 mg at 03/30/14 [id number]  . hydrOXYzine (ATARAX/VISTARIL) tablet 25 mg  25 mg Oral Q6H PRN Kerry HoughSpencer E Simon, PA-C      . magnesium hydroxide (MILK OF MAGNESIA) suspension 30 mL  30 mL Oral Daily PRN Kerry HoughSpencer E Simon, PA-C      . traZODone (DESYREL) tablet 50 mg  50 mg Oral QHS,MR X 1 Kerry HoughSpencer E Simon, PA-C   50 mg at 03/29/14 2119    Lab Results:  Results for orders placed during the hospital encounter of 03/28/14 (from the past 48 hour(s))  TSH     Status: None   Collection Time    03/29/14  6:35 AM      Result Value Ref Range   TSH 2.050  0.350 - 4.500 uIU/mL   Comment: Please note change in reference range.     [facility]  T4, FREE     Status: None   Collection Time    03/29/14  6:35 AM      Result Value Ref Range   Free T4 1.14  0.80 - 1.80 ng/dL   Comment: [facility]formed at [facility]  LIPID PANEL     Status: None   Collection Time    03/29/14  6:35 AM      Result Value Ref Range   Cholesterol 160  0 - 200 mg/dL   Triglycerides 51  <161<150 mg/dL   HDL 57  >09>39 mg/dL   Total CHOL/HDL Ratio 2.8     VLDL 10  0 - 40 mg/dL   LDL Cholesterol 93  0 - 99 mg/dL   Comment:            Total Cholesterol/HDL:CHD Risk     Coronary Heart Disease Risk Table                         Men   Women      1/2 Average Risk   3.4   3.3      Average Risk       5.0   4.4      2 X Average Risk   9.6   7.1      3 X Average Risk  23.4   11.0                Use the calculated Patient Ratio     above and the CHD Risk Table     to determine the patient's CHD Risk.                ATP III CLASSIFICATION (LDL):      <100     mg/dL   Optimal      [id number]  mg/dL   Near or Above                        Optimal      130-159  mg/dL   Borderline      [id number]  mg/dL   High      >478>190     mg/dL   Very High     [facility]    Physical Findings: AIMS: Facial and Oral Movements Muscles of Facial Expression: None, normal Lips and Perioral Area: None, normal Jaw: None, normal Tongue: None, normal,Extremity Movements Upper (arms, wrists, hands, fingers): None, normal Lower (legs, knees, ankles, toes): None, normal, Trunk Movements Neck, shoulders, hips: None, normal, Overall Severity Severity of abnormal movements (highest score from questions above): None, normal Incapacitation due to abnormal movements: None, normal Patient's awareness of abnormal movements (rate only patient's report): No Awareness, Dental Status Current problems with teeth and/or dentures?: No Does patient usually wear dentures?: No  CIWA:    COWS:     Treatment Plan Summary: Daily contact with patient to assess and evaluate symptoms and progress in treatment Medication management  Plan:1. Continue crisis management and stabilization. 2. Medication  management to reduce current symptoms to base  line and improve the patient's overall level of functioning. Patient will continue her current medication - Lexapro 10 mg daily and trazodone 50 mg at bedtime which can be repeated as needed for insomnia 3. Treat health problems as indicated. 4. Develop treatment plan to decrease risk of relapse upon discharge and to reduce the need for readmission. 5. Psycho-social education regarding relapse prevention and self care. 6. Health care follow up as needed for medical problems. 7. Disposition plans are in progress case will be discussed in treatment team tomorrow morning.   Medical Decision Making Problem Points:  Established problem, stable/improving (1) Data Points:  Review or order medicine tests (1)  I certify that inpatient services furnished can reasonably be expected to improve the patient's condition.   Randal Buba Harol Shabazz 03/30/2014, 1:51 PM

## 2014-03-31 MED ORDER — BUPROPION HCL 100 MG PO TABS
100.0000 mg | ORAL_TABLET | ORAL | Status: DC
  Administered 2014-04-01 – 2014-04-03 (×3): 100 mg via ORAL
  Filled 2014-03-31 (×4): qty 1
  Filled 2014-03-31: qty 14
  Filled 2014-03-31: qty 1

## 2014-03-31 NOTE — [facility] Group Notes (Signed)
[facility] LCSW Group Therapy  Feelings Around Relapse 1:15 -2:30        03/31/2014   Type of Therapy:  Group Therapy  Participation Level:  Appropriate  Participation Quality:  Appropriate  Affect:  Appropriate  Cognitive:  Attentive Appropriate  Insight:  Developing/Improving  Engagement in Therapy: Developing/Improving  Modes of Intervention:  Discussion Exploration Problem-Solving Supportive  Summary of Progress/Problems:  The topic for today was feelings around relapse.    Patient processed feelings toward relapse and was able to relate to peers. Patient shared she does not bath and stays in bed all days.  She shared on day third of not bathing, she would force herself into the bathroom to clean herself up.  Patient identified coping skills that can be used to prevent a relapse.   Wynn BankerHodnett, Esias Mory Hairston 03/31/2014

## 2014-03-31 NOTE — Progress Notes (Signed)
Patient ID: Jasmine [name], female   DOB: [birth year], 38 y.o.   MR[facility]: [id number] [facility]  03/31/2014 1:28 PM Jasmine [name]  MRN:  [id number]  Subjective:  Patient notes that she is feeling better, has bathed today, and is able to joke with other patients.  She denies SI/HI. She rates her depression as a 2/10 and her anxiety is a 0/10. She reports no side effects to the medications.  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 rate 18, height 5\' 1"  (1.549 m), weight 79.606 kg (175 lb 8 oz), last menstrual period 03/23/2014.Body mass index is 33.18 kg/(m^2).  General Appearance: Fairly Groomed  Patent attorneyye Contact::  Fair  Speech:  Clear and Coherent  Volume:  Normal  Mood:  Depressed  Affect:  Congruent  Thought Process:  Goal Directed  Orientation:  Full (Time, Place, and Person)  Thought Content:  WDL  Suicidal Thoughts:  Yes.  without intent/plan  Homicidal Thoughts:  No  Memory:  NA  Judgement:  Poor  Insight:  Present  Psychomotor Activity:  Decreased  Concentration:  Poor  Recall:  Poor  Fund of Knowledge:Fair  Language: Good  Akathisia:  No  Handed:  Right  AIMS (if indicated):     Assets:  Communication Skills Desire for Improvement Financial Resources/Insurance Physical Health Resilience Social Support  Sleep:  Number of Hours: 6.75   Musculoskeletal: Strength & Muscle Tone: within  normal limits Gait & Station: normal Patient leans: N/A  Current Medications: Current Facility-Administered Medications  Medication Dose Route Frequency Provider Last Rate Last Dose  . acetaminophen (TYLENOL) tablet 650 mg  650 mg Oral Q6H PRN Kerry HoughSpencer E Simon, PA-C      . alum & mag hydroxide-simeth (MAALOX/MYLANTA) 200-200-20 MG/5ML suspension 30 mL  30 mL Oral Q4H PRN Kerry HoughSpencer E Simon, PA-C      . celecoxib (CELEBREX) capsule 200 mg  200 mg Oral Daily Verne SpurrNeil Mashburn, PA-C   200 mg at 03/31/14 0818  . escitalopram (LEXAPRO) tablet 10 mg  10 mg Oral Daily Verne SpurrNeil Mashburn, PA-C   10 mg at 03/31/14 [id number]  . hydrOXYzine (ATARAX/VISTARIL) tablet 25 mg  25 mg Oral Q6H PRN Kerry HoughSpencer E Simon, PA-C      . magnesium hydroxide (MILK OF MAGNESIA) suspension 30 mL  30 mL Oral Daily PRN Kerry HoughSpencer E Simon, PA-C      . traZODone (DESYREL) tablet 50 mg  50 mg Oral QHS,MR X 1 Kerry HoughSpencer E Simon, PA-C   50 mg at 03/30/14 2126    Lab Results:  No results found for this or any previous visit (from the past 48 hour(s)).  Physical Findings: AIMS: Facial and Oral Movements Muscles of Facial Expression: None, normal Lips and Perioral Area: None, normal Jaw: None, normal Tongue: None, normal,Extremity Movements Upper (arms, wrists, hands, fingers): None, normal Lower (legs, knees, ankles, toes): None, normal, Trunk Movements Neck, shoulders, hips: None, normal, Overall Severity Severity of abnormal movements (highest score from questions  above): None, normal Incapacitation due to abnormal movements: None, normal Patient's awareness of abnormal movements (rate only patient's report): No Awareness, Dental Status Current problems with teeth and/or dentures?: No Does patient usually wear dentures?: No  CIWA:  CIWA-Ar Total: 1 COWS:  COWS Total Score: 3  Treatment Plan Summary: Daily contact with patient to assess and evaluate symptoms and progress in treatment Medication management  Plan: 1. Continue crisis  management and stabilization. 2. Medication management to reduce current symptoms to base line and improve the patient's overall level of functioning. Patient will continue her current medication - Lexapro 10 mg daily and trazodone 50 mg at bedtime which can be repeated as needed for insomnia 3. Treat health problems as indicated. 4. Develop treatment plan to decrease risk of relapse upon discharge and to reduce the need for readmission. 5. Psycho-social education regarding relapse prevention and self care. 6. Health care follow up as needed for medical problems. 7. Disposition plans are in progress case will be discussed in treatment team tomorrow morning. 8. Patient wants to avoid the Abilify. She is interested in trying Welbutrin .  Will add wellbutrin 100mg  po each AM to start in the AM.  Medical Decision Making Problem Points:  Established problem, stable/improving (1) Data Points:  Review or order medicine tests (1)  I certify that inpatient services furnished can reasonably be expected to improve the patient's condition.   Verne Spurreil Mashburn 03/31/2014, 1:28 PM  Reviewed the information documented and agree with the treatment plan.  Randal BubaJanardhaha R Myesha Stillion 03/31/2014 4:43 PM

## 2014-03-31 NOTE — Progress Notes (Signed)
D:  Patient's self inventory sheet, patient sleeps well, improving appetite, normal energy level, good attention span.  Rated depression 2, denied hopeless and anxiety.  Denied withdrawals.  Denied SI.  Denied physical problems.  Worst pain #1.  Plans to communicate her feelings more and increase pleasurable activities.  Plans to live with her mother after discharge.  No problems with medications after discharge. A:  Medications administered per MD orders.  Emotional support and encouragement given patient. R:  Denied SI and HI, contracts for safety.  Denied A/V hallucinations.  Emotional support and encouragement given patient.

## 2014-03-31 NOTE — Progress Notes (Signed)
[facility] Group Notes:  (Nursing/MHT/Case Management/Adjunct)  Date:  03/31/2014  Time:  9:49 PM  Type of Therapy:  Group Therapy  Participation Level:  Active  Participation Quality:  Appropriate  Affect:  Appropriate  Cognitive:  Appropriate  Insight:  Appropriate  Engagement in Group:  Engaged  Modes of Intervention:  Socialization and Support  Summary of Progress/Problems: Pt. Participated in group discussion.  Pt. Stated being overwhelmed and unable to focus as early warning signs of relapse.  Sondra ComeRyan J Vidhi Delellis 03/31/2014, 9:49 PM

## 2014-04-01 ENCOUNTER — Encounter (HOSPITAL_COMMUNITY): Payer: Self-pay | Admitting: Registered Nurse

## 2014-04-01 NOTE — Progress Notes (Signed)
D Hanako is OOB UAL on the unit, tolerated well. She is bright. SHe makes good eye contact. SHe smiles pleasantly when this nurse greets her first thing this morning and administers her morning medication.    A She is engaged in her recovery as evidenced by her attendance and participation in her groups . She demonstrates insight into her problems  When she shares her feelings with this Clinical research associatewriter, ie " i know I have a choice now". She completes her morning self inventory and on it she writes  She denies SI, she rates her depression and hopelessness "2/0", respectively and says her DC plan is to ' go for long walks".   R Safety is in place and poc cont.

## 2014-04-01 NOTE — Progress Notes (Signed)
The focus of this group is to help patients review their daily goal of treatment and discuss progress on daily workbooks. Pt attended the evening group session and responded to all discussion prompts from the Writer. Pt shared that today was a good day on the unit, the highlight of which was a visit from her family, whom she considers enormously supportive of her getting treatment. Pt reported having no additional needs from Nursing Staff this evening. Pt's affect was bright.

## 2014-04-01 NOTE — Progress Notes (Signed)
Psychoeducational Group Note  Date: 04/01/2014 Time:  1015  Group Topic/Focus:  Identifying Needs:   The focus of this group is to help patients identify their personal needs that have been historically problematic and identify healthy behaviors to address their needs.  Participation Level:  Active  Participation Quality:  Attentive  Affect:  Flat  Cognitive:  Oriented  Insight:  Improving  Engagement in Group:  Engaged  Additional Comments:  Pt attended the group and partisipated  Eleno Weimar A 

## 2014-04-01 NOTE — Progress Notes (Signed)
NP note reviewed. Agree with above assessment and plan.  Tiondra Fang P Irianna Gilday, MD 

## 2014-04-01 NOTE — Progress Notes (Signed)
Adult Psychoeducational Group Note  Date:  04/01/2014 Time:  5:19 PM  Group Topic/Focus:  Healthy Communication:   The focus of this group is to discuss communication, barriers to communication, as well as healthy ways to communicate with others.  Participation Level:  Minimal  Participation Quality:  Attentive  Affect:  Appropriate  Cognitive:  Oriented  Insight: Appropriate  Engagement in Group:  Developing/Improving  Modes of Intervention:  Discussion, Socialization and Support  Elijio MilesBrittany N Kynadee Dam 04/01/2014, 5:19 PM

## 2014-04-01 NOTE — [facility] Group Notes (Signed)
[facility] LCSW Group Therapy  04/01/2014 4:18 PM  Type of Therapy:  Group Therapy  Participation Level:  None  Participation Quality:  Attentive  Affect:  Depressed  Cognitive:  Alert and Oriented  Insight:  UTA  Engagement in Therapy:  None  Modes of Intervention:  Discussion, Exploration, Problem-solving, Rapport Building, Socialization and Support  Summary of Progress/Problems: The main focus of today's process group was to identify the patient's current support system and decide on other supports that can be put in place. An emphasis was placed on using counselor, doctor, therapy groups, 12-step groups, and problem-specific support groups to expand supports, as well as doing something different than has been done before.   Jasmine [name] provided no therapeutic contribution to today's group discussion. She was observed to be attentive to her peers as she actively listened to the discussion and occasionally nodded towards statements in which she agreed with. Patient left group to speak to NP and then returned without any desire towards discussing her perspective in regard to her own current support system.  Jasmine KhanGregory C Pickett Jr. 04/01/2014, 4:18 PM

## 2014-04-01 NOTE — Progress Notes (Signed)
Patient ID: Jasmine [name], female   DOB: 02-Jan-1976, 38 y.o.   MRN: [id number] D: Patient stated she is doing well and plans for discharge is to workout and take care of herself. Pt reports decreased anxiety and depressive symptoms. Pt rate depression as 2 and anxiety as 0 on a 0-10 scale. Pt denies SI/HI/AVH and pain. Pt attended evening wrap up group and engaged in discussion.  Cooperative with assessment. No acute distressed noted at this time.   A: Met with pt 1:1. Medications administered as prescribed. Writer encouraged pt to discuss feelings. Pt encouraged to come to staff with any questions or concerns.   R: Patient is safe on the unit. She is complaint with medications and denies any adverse reaction. Continue current POC.

## 2014-04-01 NOTE — Progress Notes (Addendum)
Patient ID: Jasmine [name] [place], female   DOB: 12-Oct-1976, 38 y.o.   [id number] [facility]  04/01/2014 12:57 PM Jasmine [name]  MRN:  [id number]  Subjective:  Patient states that she is here int the hospital because of depression, anxiety, suicidal thoughts, and panic attack.  Patient states that she is feeling better.  Patient denies suicidal/homicidal ideation, psychosis, and paranoia.  Patient states that being in the hospital has helped a lot.   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 Participating in group sessions  Psychiatric Specialty Exam: Physical Exam  Constitutional: She is oriented to person, place, and time.  HENT:  Head: Normocephalic.  Neck: Normal range of motion.  Respiratory: Effort normal.  Musculoskeletal: Normal range of motion.  Neurological: She is alert and oriented to person, place, and time.  Skin: Skin is warm and dry.    Review of Systems  Gastrointestinal: Negative for nausea, vomiting, abdominal pain and diarrhea.  Musculoskeletal: Negative for back pain, joint pain, myalgias and neck pain.  Neurological: Negative for headaches.  Psychiatric/Behavioral: Positive for depression. Negative for hallucinations and substance abuse. Suicidal ideas: Patient is denying suicidal ideation at this time. The patient is nervous/anxious. The patient does not have insomnia.     Blood pressure 119/86, pulse 77, temperature 98 [name] (36.7 C), temperature source Oral, resp. rate 18,  height 5\' 1"  (1.549 m), weight 79.606 kg (175 lb 8 oz), last menstrual period 03/23/2014.Body mass index is 33.18 kg/(m^2).  General Appearance: Fairly Groomed  Patent attorneyye Contact::  Fair  Speech:  Clear and Coherent  Volume:  Normal  Mood:  Depressed  Affect:  Congruent  Thought Process:  Goal Directed  Orientation:  Full (Time, Place, and Person)  Thought Content:  WDL  Suicidal Thoughts:  No  Patient denies at this time  Homicidal Thoughts:  No  Memory:  NA  Judgement:  Poor  Insight:  Present  Psychomotor Activity:  Decreased  Concentration:  Poor  Recall:  Poor  Fund of Knowledge:Fair  Language: Good  Akathisia:  No  Handed:  Right  AIMS (if indicated):     Assets:  Communication Skills Desire for Improvement Financial Resources/Insurance Physical Health Resilience Social Support  Sleep:  Number of Hours: 6.75   Musculoskeletal: Strength & Muscle Tone: within normal limits Gait & Station: normal Patient leans: N/A  Current Medications: Current Facility-Administered Medications  Medication Dose Route Frequency Provider Last Rate Last Dose  . acetaminophen (TYLENOL) tablet 650 mg  650 mg Oral Q6H PRN Kerry HoughSpencer E Simon, PA-C      . alum & mag hydroxide-simeth (MAALOX/MYLANTA) 200-200-20 MG/5ML suspension 30 mL  30 mL Oral Q4H PRN Kerry HoughSpencer E Simon, PA-C      . celecoxib (CELEBREX) capsule 200 mg  200 mg Oral Daily Verne SpurrNeil Mashburn, PA-C   200 mg at 03/31/14 0818  . escitalopram (LEXAPRO) tablet 10 mg  10 mg Oral Daily Verne SpurrNeil Mashburn, PA-C   10 mg at 03/31/14 0818  . hydrOXYzine (ATARAX/VISTARIL) tablet 25 mg  25 mg Oral Q6H PRN Kerry HoughSpencer E Simon,  PA-C      . magnesium hydroxide (MILK OF MAGNESIA) suspension 30 mL  30 mL Oral Daily PRN Kerry HoughSpencer E Simon, PA-C      . traZODone (DESYREL) tablet 50 mg  50 mg Oral QHS,MR X 1 Kerry HoughSpencer E Simon, PA-C   50 mg at 03/30/14 2126    Lab Results:  No results found for this or any previous visit (from the past 48 hour(s)).  Physical  Findings: AIMS: Facial and Oral Movements Muscles of Facial Expression: None, normal Lips and Perioral Area: None, normal Jaw: None, normal Tongue: None, normal,Extremity Movements Upper (arms, wrists, hands, fingers): None, normal Lower (legs, knees, ankles, toes): None, normal, Trunk Movements Neck, shoulders, hips: None, normal, Overall Severity Severity of abnormal movements (highest score from questions above): None, normal Incapacitation due to abnormal movements: None, normal Patient's awareness of abnormal movements (rate only patient's report): No Awareness, Dental Status Current problems with teeth and/or dentures?: No Does patient usually wear dentures?: No  CIWA:  CIWA-Ar Total: 1 COWS:  COWS Total Score: 3  Treatment Plan Summary: Daily contact with patient to assess and evaluate symptoms and progress in treatment Medication management  Plan:  Continue with current plan 1. Continue crisis management and stabilization. 2. Medication management to reduce current symptoms to base line and improve the patient's overall level of functioning. Patient will continue her current medication - Lexapro 10 mg daily and trazodone 50 mg at bedtime which can be repeated as needed for insomnia 3. Treat health problems as indicated. 4. Develop treatment plan to decrease risk of relapse upon discharge and to reduce the need for readmission. 5. Psycho-social education regarding relapse prevention and self care. 6. Health care follow up as needed for medical problems. 7. Disposition plans are in progress case will be discussed in treatment team tomorrow morning. 8. Patient wants to avoid the Abilify. She is interested in trying Welbutrin .  Will add wellbutrin 100mg  po each AM to start in the AM.  Medical Decision Making Problem Points:  Established problem, stable/improving (1), Review of last therapy session (1) and Review of psycho-social stressors (1) Data Points:  Review or order  clinical lab tests (1) Review or order medicine tests (1) Review of medication regiment & side effects (2)  I certify that inpatient services furnished can reasonably be expected to improve the patient's condition.   Neita Landrigan, FNP-BC 04/01/2014, 12:57 PM

## 2014-04-01 NOTE — Progress Notes (Signed)
D   Pt is pleasant and appropriate   She said her trazadone did not help her sleep last night but did make her feel like she was dragging all day   She attends and participates in groups   A   Verbal support given   Discussed medications and offered her vistaril instead of trazadone  Administered same and will monitor for effectiveness    Q 15 min checks R   Pt safe at present

## 2014-04-01 NOTE — Progress Notes (Signed)
Report received from P.Nutall RN. Writer entered patients room and observed her lying in bed asleep with eyes closed and respirations even and unlabored. No distress noted. Safety maintained with 15 min checks.

## 2014-04-01 NOTE — Progress Notes (Signed)
.  Psychoeducational Group Note    Date: 04/01/2014 Time: 0930  Goal Setting Purpose of Group: To be able to set a goal that is measurable and that can be accomplished in one day Participation Level:  Active  Participation Quality:  Appropriate  Affect:  Appropriate  Cognitive:  Oriented  Insight:  Improving  Engagement in Group:  Engaged  Additional Comments:  Participating and engaged in the group.  Jasmine [name] A  

## 2014-04-02 DIAGNOSIS — F332 Major depressive disorder, recurrent severe without psychotic features: Secondary | ICD-10-CM

## 2014-04-02 NOTE — Progress Notes (Signed)
Patient ID: Jasmine [name], female   DOB: August 07, 1976, 38 y.o.   MR[facility]: [id number] [facility]  04/02/2014 4:10 PM Jasmine [name]  MRN:  [id number]  Subjective:  Patient states she is doing well, but feels like she needs to eat breakfast with the Welbutrin as it makes her feel a little giddy. She rates her depression is a 1/10 and her anxiety is a 0/10. She hopes to discharge tomorrow.  She denies SI/HI or AVH. 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 Participating in group sessions  Psychiatric Specialty Exam: Physical Exam  Constitutional: She is oriented to person, place, and time.  HENT:  Head: Normocephalic.  Neck: Normal range of motion.  Respiratory: Effort normal.  Musculoskeletal: Normal range of motion.  Neurological: She is alert and oriented to person, place, and time.  Skin: Skin is warm and dry.    Review of Systems  Gastrointestinal: Negative for nausea, vomiting, abdominal pain and diarrhea.  Musculoskeletal: Negative for back pain, joint pain, myalgias and neck pain.  Neurological: Negative for headaches.  Psychiatric/Behavioral: Positive for depression. Negative for hallucinations and substance abuse. Suicidal ideas: Patient is denying suicidal ideation at this time. The patient is nervous/anxious. The patient does not have insomnia.     Blood pressure 125/84, pulse 85, temperature 98.3 F (36.8 C), temperature source Oral, resp. rate 17, height 5\' 1"  (1.549 m), weight 79.606 kg (175  lb 8 oz), last menstrual period 03/23/2014.Body mass index is 33.18 kg/(m^2).  General Appearance: Fairly Groomed  Patent attorneyye Contact::  Fair  Speech:  Clear and Coherent  Volume:  Normal  Mood:  Depressed  Affect:  Congruent  Thought Process:  Goal Directed  Orientation:  Full (Time, Place, and Person)  Thought Content:  WDL  Suicidal Thoughts:  No  Patient denies at this time  Homicidal Thoughts:  No  Memory:  NA  Judgement:  Poor  Insight:  Present  Psychomotor Activity:  Decreased  Concentration:  Poor  Recall:  Poor  Fund of Knowledge:Fair  Language: Good  Akathisia:  No  Handed:  Right  AIMS (if indicated):     Assets:  Communication Skills Desire for Improvement Financial Resources/Insurance Physical Health Resilience Social Support  Sleep:  Number of Hours: 6.75   Musculoskeletal: Strength & Muscle Tone: within normal limits Gait & Station: normal Patient leans: N/A  Current Medications: Current Facility-Administered Medications  Medication Dose Route Frequency Provider Last Rate Last Dose  . acetaminophen (TYLENOL) tablet 650 mg  650 mg Oral Q6H PRN Kerry HoughSpencer E Simon, PA-C      . alum & mag hydroxide-simeth (MAALOX/MYLANTA) 200-200-20 MG/5ML suspension 30 mL  30 mL Oral Q4H PRN Kerry HoughSpencer E Simon, PA-C      . celecoxib (CELEBREX) capsule 200 mg  200 mg Oral Daily Verne SpurrNeil Jerra Huckeby, PA-C   200 mg at 03/31/14 0818  . escitalopram (LEXAPRO) tablet 10 mg  10 mg Oral Daily Verne SpurrNeil Kvion Shapley, PA-C   10 mg at 03/31/14 0818  . hydrOXYzine (ATARAX/VISTARIL) tablet 25 mg  25 mg Oral Q6H PRN Mena GoesSpencer E  Simon, PA-C      . magnesium hydroxide (MILK OF MAGNESIA) suspension 30 mL  30 mL Oral Daily PRN Kerry HoughSpencer E Simon, PA-C      . traZODone (DESYREL) tablet 50 mg  50 mg Oral QHS,MR X 1 Kerry HoughSpencer E Simon, PA-C   50 mg at 03/30/14 2126    Lab Results:  No results found for this or any previous visit (from the past 48 hour(s)).  Physical Findings: AIMS: Facial and Oral Movements Muscles of Facial  Expression: None, normal Lips and Perioral Area: None, normal Jaw: None, normal Tongue: None, normal,Extremity Movements Upper (arms, wrists, hands, fingers): None, normal Lower (legs, knees, ankles, toes): None, normal, Trunk Movements Neck, shoulders, hips: None, normal, Overall Severity Severity of abnormal movements (highest score from questions above): None, normal Incapacitation due to abnormal movements: None, normal Patient's awareness of abnormal movements (rate only patient's report): No Awareness, Dental Status Current problems with teeth and/or dentures?: No Does patient usually wear dentures?: No  CIWA:  CIWA-Ar Total: 1 COWS:  COWS Total Score: 3  Treatment Plan Summary: Daily contact with patient to assess and evaluate symptoms and progress in treatment Medication management  Plan:  Continue with current plan 1. Continue crisis management and stabilization. 2. Medication management to reduce current symptoms to base line and improve the patient's overall level of functioning. Patient will continue her current medication - Lexapro 10 mg daily and trazodone 50 mg at bedtime which can be repeated as needed for insomnia 3. Treat health problems as indicated. 4. Develop treatment plan to decrease risk of relapse upon discharge and to reduce the need for readmission. 5. Psycho-social education regarding relapse prevention and self care. 6. Health care follow up as needed for medical problems. 7. Disposition plans are in progress case will be discussed in treatment team tomorrow morning. 8. Continue wellbutrin 100mg  po each AM to start in the AM.  Medical Decision Making Problem Points:  Established problem, stable/improving (1), Review of last therapy session (1) and Review of psycho-social stressors (1) Data Points:  Review or order clinical lab tests (1) Review or order medicine tests (1) Review of medication regiment & side effects (2)  I certify that inpatient  services furnished can reasonably be expected to improve the patient's condition.  Rona RavensNeil T. Hoke Baer RPAC 4:13 PM 04/02/2014

## 2014-04-02 NOTE — Progress Notes (Signed)
Above note was reviewed. Concur with above assessment and plan.  Rox Mcgriff P Trevonne Nyland, MD 

## 2014-04-02 NOTE — Progress Notes (Signed)
Psychoeducational Group Note  Date: 04/02/2014 Time:  0930   Group Topic/Focus:  Gratefulness:  The focus of this group is to help patients identify what two things they are most grateful for in their lives. What helps ground them and to center them on their work to their recovery.  Participation Level:  Active  Participation Quality:  Appropriate  Affect:  Appropriate  Cognitive:  Oriented  Insight:  Improving  Engagement in Group:  Engaged  Additional Comments:  Pt. participated in the group.  Derald Lorge A   

## 2014-04-02 NOTE — Progress Notes (Signed)
[name] is seen  Out in the milieu today...she is pleasant and smiles  and is seen interacting with her peers in the dayroom.  She is engaged in her recovery as evidenced by the act that she attends all of her groups, she asks appripraite questions regarding her work in the Sunday Patient Workbook and she shares future life changes ( she plans to make after DC) with this nurse.   A  She is  Avidly trying to develop and put into use newer and healthier coping  Skills .  She completes her morning self inventory and on it she writes  She denies SI with in the previous 24 hrs, she rates her depression and hopelessness "1/0", respectively and she states her DC plan is  " to practice telling myself I'm not a bad person if I don't make my diet , or I'm not a lazy prson".   R Safety is in place and poc cont.

## 2014-04-02 NOTE — Progress Notes (Signed)
Psychoeducational Group Note  Date:  04/02/2014 Time:  1015  Group Topic/Focus:  Making Healthy Choices:   The focus of this group is to help patients identify negative/unhealthy choices they were using prior to admission and identify positive/healthier coping strategies to replace them upon discharge.  Participation Level:  Active  Participation Quality:  Appropriate  Affect:  Appropriate  Cognitive:  Oriented  Insight:  Improving  Engagement in Group:  Engaged  Additional Comments:  Attended the group and partisapated  Pearlie Nies A 04/02/2014  

## 2014-04-02 NOTE — [facility] Group Notes (Signed)
[facility] LCSW Group Therapy Note  04/02/2014 / 1:15 PM  Type of Therapy and Topic:  Group Therapy: Avoiding Self-Sabotaging and Enabling Behaviors  Participation Level:  Minimal   Mood: Quiet  Description of Group:     Learn how to identify obstacles, self-sabotaging and enabling behaviors, what are they, why do we do them and what needs do these behaviors meet? Discuss unhealthy relationships and how to have positive healthy boundaries with those that sabotage and enable. Explore aspects of self-sabotage and enabling in yourself and how to limit these self-destructive behaviors in everyday life.  Therapeutic Goals: 1. Patient will identify one obstacle that relates to self-sabotage and enabling behaviors 2. Patient will identify one personal self-sabotaging or enabling behavior they did prior to admission 3. Patient able to establish a plan to change the above identified behavior they did prior to admission:  4. Patient will demonstrate ability to communicate their needs through discussion and/or role plays.   Summary of Patient Progress: The main focus of today's process group was to discuss what "self-sabotage" means and use Motivational Interviewing to discuss what benefits, negative or positive, were involved in a self-identified self-sabotaging behavior. We then talked about reasons the patient may want to change the behavior and her current desire to change. Patient shared during warm up that she is looking forward to a new start this year. Patient shared that she is willing to make one significant change to promote self care upon discharge and that change is to avoid isolation at least every other day.   Therapeutic Modalities:   Cognitive Behavioral Therapy Person-Centered Therapy Motivational Interviewing   Jasmine Bernatherine C Harrill, LCSW

## 2014-04-03 DIAGNOSIS — F339 Major depressive disorder, recurrent, unspecified: Secondary | ICD-10-CM

## 2014-04-03 MED ORDER — BUPROPION HCL 100 MG PO TABS: 100.0000 mg | ORAL_TABLET | ORAL | Status: DC

## 2014-04-03 MED ORDER — CELECOXIB 200 MG PO CAPS: 200.0000 mg | ORAL_CAPSULE | Freq: Every day | ORAL | Status: DC

## 2014-04-03 MED ORDER — TRAZODONE HCL 50 MG PO TABS
50.0000 mg | ORAL_TABLET | Freq: Every evening | ORAL | Status: DC | PRN
  Filled 2014-04-03: qty 14

## 2014-04-03 MED ORDER — ESCITALOPRAM OXALATE 10 MG PO TABS: 10.0000 mg | ORAL_TABLET | Freq: Every day | ORAL | Status: DC

## 2014-04-03 MED ORDER — TRAZODONE HCL 50 MG PO TABS: ORAL_TABLET | ORAL | Status: DC

## 2014-04-03 NOTE — Progress Notes (Signed)
[facility] Adult Case Management Discharge Plan :  [facility] Yes,  returning home At discharge, do you have transportation home?:Yes,  family will pick pt up Do you have the ability to pay for your medications:Yes,  provided pt with prescriptions and pt verbalizes ability to afford meds.   Release of information consent forms completed and in the chart;  Patient's signature needed at discharge.  Patient to Follow up at: Follow-up Information   [facility] On 04/11/2014. (Appointment scheduled at 10:30 am on this date with Shaaron Adlerlaudia McCoy for therapy.  They will than schedule you for medication management.  )    Contact information:   [address] [place], [place]   [address]  [phone]      Patient denies SI/HI:   Yes,  denies SI/HI    Safety Planning and Suicide Prevention discussed:  Yes,  discussed with pt and pt's sister.  See suicide prevention education note.   Margeart Allender N Horton 04/03/2014, 11:20 AM

## 2014-04-03 NOTE — [facility] Group Notes (Signed)
[facility]   04/03/2014 8:45 AM  Participation Quality:  Alert, Appropriate and Oriented  Mood/Affect:  Flat and Depressed  Depression Rating:  0  Anxiety Rating:  0  Thoughts of Suicide:  Pt denies SI/HI  Will you contract for safety?   Yes  Current AVH:  Pt denies  Plan for Discharge/Comments:  Pt attended discharge planning group and actively participated in group.  CSW provided pt with today's workbook.  Pt reports feeling ready to d/c today.  [place] and [facility] and therapy.  No further needs voiced by pt at this time.     Transportation Means: Pt reports access to transportation - family will pick pt up  Supports: No supports mentioned at this time  Jasmine IvanChelsea Horton, LCSW 04/03/2014 10:00 AM

## 2014-04-03 NOTE — Progress Notes (Signed)
[facility] Group Notes:  (Nursing/MHT/Case Management/Adjunct)  Date:  04/03/2014  Time:  12:07 AM  Type of Therapy:  Group Therapy  Participation Level:  Active  Participation Quality:  Appropriate  Affect:  Appropriate  Cognitive:  Appropriate  Insight:  Appropriate  Engagement in Group:  Engaged  Modes of Intervention:  Socialization and Support  Summary of Progress/Problems: Pt. Was able to identify a conflict and work towards resolving her conflict.  Sondra ComeRyan J Shaleena Crusoe 04/03/2014, 12:07 AM

## 2014-04-03 NOTE — Progress Notes (Signed)
Patient ID: Jasmine [name], female   DOB: 1976/04/26, 38 y.o.   MRN: [id number] PER STATE REGULATIONS 482.30  THIS CHART WAS REVIEWED FOR MEDICAL NECESSITY WITH RESPECT TO THE PATIENT'S ADMISSION/ DURATION OF STAY.  NEXT REVIEW DATE: 04/05/2014  Willa RoughJENNIFER JONES [name], RN, BSN CASE MANAGER

## 2014-04-03 NOTE — Progress Notes (Signed)
Patient ID: Jasmine [name], female   DOB: 02/25/1976, 38 y.o.   MRN: [id number]  Pt. Denies SI/HI and A/V hallucinations. Patient rates her depression 1/10 and her hopelessness at 0/10 for the day. Belongings at the bedside were returned to patient at time of discharge. No items in a locker Patient denies any pain or discomfort. Discharge instructions and medications were reviewed with patient. Patient verbalized understanding of both medications and discharge instructions. Q15 minute safety checks maintained until discharge. No distress noted upon discharge.

## 2014-04-03 NOTE — Discharge Summary (Signed)
Physician Discharge Summary Note  Patient:  Jasmine [name] is an 38 y.o., female MRN:  [id number] DOB:  25-Apr-1976 Patient phone:  [phone] (home)  Patient address:   [address] [place] [address],  Total Time spent with patient: 30 minutes  Date of Admission:  03/28/2014 Date of Discharge: 04/03/2014  Reason for Admission:  MDD  Discharge Diagnoses: Active Problems:   MDD (major depressive disorder)   Psychiatric Specialty Exam:  Please see D/C SRA Physical Exam  ROS  Blood pressure 119/79, pulse 96, temperature 98 F (36.7 C), temperature source Oral, resp. rate 16, height 5\' 1"  (1.549 m), weight 79.606 kg (175 lb 8 oz), last menstrual period 03/23/2014.Body mass index is 33.18 kg/(m^2).  General Appearance  Eye Contact::    Speech:    Volume:    Mood:    Affect:    Thought Process:    Orientation:    Thought Content:    Suicidal Thoughts:    Homicidal Thoughts:    Memory:    Judgement:    Insight:    Psychomotor Activity:    Concentration:    Recall:    Fund of Knowledge:  Language:   Akathisia:    Handed:    AIMS (if indicated):     Assets:    Sleep:  Number of Hours: 6.75    Past Psychiatric History: Diagnosis:  Hospitalizations:  1 previously in [place]  Outpatient Care:   none  Substance Abuse Care:  Self-Mutilation:  Suicidal Attempts:  Violent Behaviors:   Musculoskeletal: Strength & Muscle Tone:  Gait & Station:  Patient leans:   DSM5:  Schizophrenia Disorders:   Obsessive-Compulsive Disorders:   Trauma-Stressor Disorders:   Substance/Addictive Disorders:   Depressive Disorders:    Axis Diagnosis:  Discharge Diagnoses:  AXIS I: Major Depression recurrent  AXIS II: No diagnosis  AXIS III:  Past Medical History   Diagnosis  Date   .  Ectopic pregnancy    .  Migraine    .  Osteoarthritis    .  Anxiety    .  PTSD (post-traumatic stress disorder)    .  Depression     AXIS IV: other psychosocial or environmental  problems  AXIS V: 61-70 mild symptoms    Level of Care:  OP  Hospital Course:  Jasmine [name] is a 38 year old AAF who presented to the [facility] with family reporting depression with suicidal ideation and a plan to overdose on medication she had been stock piling.        She was given medical clearance and [facility] and [facility].  Upon arrival at the unit Jasmine [name] was evaluated by a psychiatrist and her symptoms were identified. She preferred not to take Abilify as it had made her gain weight in the past. She was started on Lexapro for her depression and Ativan for anxiety.         Brandyn was asked to participate in unit programming for supportive therapeutic milieu. She did so and was active and 100% attentive.         She responded well to medication and agreed that a trial of Welbutrin would be welcomed for the additive benefit of treatment giving her more energy and being weight neutral.           Welbutrin 100mg  was started each morning with the Lexapro. She tolerated the medication well and had no side effects.  Her transformation was impressive as she had a  significant change in her affect and appeared more appropriate and less blunted and flat. She made good eye contact, and smiled easily. Her speech was more spontaneous and more audible than upon arrival.           By the day of discharge Jasmine [name] was in much improved condition and denied SI/HI or AVH. She was pleased with the out come of this admission and grateful for the time staff had spent with her.     [name] and given medical [facility]. UDS was negative and her labs were unremarkable. Upon   Consults:  None  Significant Diagnostic Studies:  None  Discharge Vitals:   Blood pressure 119/79, pulse 96, temperature 98 F (36.7 C), temperature source Oral, resp. rate 16, height 5\' 1"  (1.549 m), weight 79.606 kg (175 lb 8 oz), last menstrual period 03/23/2014. Body mass index is 33.18  kg/(m^2). Lab Results:   No results found for this or any previous visit (from the past 72 hour(s)).  Physical Findings: AIMS: Facial and Oral Movements Muscles of Facial Expression: None, normal Lips and Perioral Area: None, normal Jaw: None, normal Tongue: None, normal,Extremity Movements Upper (arms, wrists, hands, fingers): None, normal Lower (legs, knees, ankles, toes): None, normal, Trunk Movements Neck, shoulders, hips: None, normal, Overall Severity Severity of abnormal movements (highest score from questions above): None, normal Incapacitation due to abnormal movements: None, normal Patient's awareness of abnormal movements (rate only patient's report): No Awareness, Dental Status Current problems with teeth and/or dentures?: No Does patient usually wear dentures?: No  CIWA:  CIWA-Ar Total: 1 COWS:  COWS Total Score: 3  Psychiatric Specialty Exam: See Psychiatric Specialty Exam and Suicide Risk Assessment completed by Attending Physician prior to discharge.  Discharge destination:  Home  Is patient on multiple antipsychotic therapies at discharge:  No   Has Patient had three or more failed trials of antipsychotic monotherapy by history:  No  Recommended Plan for Multiple Antipsychotic Therapies: NA  Discharge Orders   Future Orders Complete By Expires   Diet - low sodium heart healthy  As directed    Discharge instructions  As directed    Increase activity slowly  As directed        Medication List    STOP taking these medications       ibuprofen 200 MG tablet  Commonly known as:  ADVIL,MOTRIN      TAKE these medications     Indication   buPROPion 100 MG tablet  Commonly known as:  WELLBUTRIN  Take 1 tablet (100 mg total) by mouth every morning.   Indication:  Major Depressive Disorder     celecoxib 200 MG capsule  Commonly known as:  CELEBREX  Take 1 capsule (200 mg total) by mouth daily.   Indication:  Joint Damage causing Pain and Loss of Function      escitalopram 10 MG tablet  Commonly known as:  LEXAPRO  Take 1 tablet (10 mg total) by mouth daily.   Indication:  Depression     traZODone 50 MG tablet  Commonly known as:  DESYREL  Take one tablet at bedtime if needed for insomnia.   Indication:  Trouble Sleeping           Follow-up Information   [facility]   Contact information:   [address] [place], [place]   [address]  [phone]      Follow-up recommendations:   Activities: Resume activity as tolerated. Diet: Heart  healthy low sodium diet Tests: Follow up testing will be determined by your out patient provider.  Comments:    Total Discharge Time:  Less than 30 minutes.  Signed: Verne Spurr 04/03/2014, 9:06 AM Personally evaluated the patient, and agree with assessment and plan Madie Reno A. Dub Mikes, M.D.

## 2014-04-03 NOTE — Progress Notes (Signed)
Writer has observed patient up in the dayroom talking with select peers and interacting appropriately. Patient attended group and participated this evening.She requested print out on celebrex and wellbutrin which Clinical research associatewriter printed for her to read over. She reports that her day has been good and the celebrex works well for her arthritis in her knees. She is hoping to discharge on tomorrow. She denies si/hi/a/v hallucinations. Safety maintained on unit with 15 min checks.

## 2014-04-03 NOTE — [facility] Suicide Risk Assessment (Signed)
Suicide Risk Assessment  Discharge Assessment     Demographic Factors:  NA  Total Time spent with patient: 45 minutes  Psychiatric Specialty Exam:     Blood pressure 119/79, pulse 96, temperature 98 F (36.7 C), temperature source Oral, resp. rate 16, height 5\' 1"  (1.549 m), weight 79.606 kg (175 lb 8 oz), last menstrual period 03/23/2014.Body mass index is 33.18 kg/(m^2).  General Appearance: Fairly Groomed  Patent attorneyye Contact::  Fair  Speech:  Clear and Coherent  Volume:  Decreased  Mood:  "better"  Affect:  Appropriate  Thought Process:  Coherent and Goal Directed  Orientation:  Full (Time, Place, and Person)  Thought Content:  coping plans, priorities how to address them  Suicidal Thoughts:  No  Homicidal Thoughts:  No  Memory:  Immediate;   Fair Recent;   Fair Remote;   Fair  Judgement:  Fair  Insight:  Present  Psychomotor Activity:  Normal  Concentration:  Fair  Recall:  FiservFair  Fund of Knowledge:NA  Language: Fair  Akathisia:  No  Handed:    AIMS (if indicated):     Assets:  Desire for Improvement Housing Social Support Transportation  Sleep:  Number of Hours: 6.75    Musculoskeletal: Strength & Muscle Tone: within normal limits Gait & Station: normal Patient leans: N/A   Mental Status Per Nursing Assessment::   On Admission:  Self-harm thoughts  Current Mental Status by Physician: In full contact with reality. Expresses marked improvement in her mood, new insights in term of better ways of managing her depression   Loss Factors: retired after 20 years in the [employer]  Historical Factors: NA  Risk Reduction Factors:   Sense of responsibility to family, Living with another person, especially a relative and Positive social support  Continued Clinical Symptoms:  Depression:   Insomnia  Cognitive Features That Contribute To Risk: None identified   Suicide Risk:  Minimal: No identifiable suicidal ideation.  Patients presenting with no risk factors  but with morbid ruminations; may be classified as minimal risk based on the severity of the depressive symptoms  Discharge Diagnoses:   AXIS I:  Major Depression recurrent AXIS II:  No diagnosis AXIS III:   Past Medical History  Diagnosis Date  . Ectopic pregnancy   . Migraine   . Osteoarthritis   . Anxiety   . PTSD (post-traumatic stress disorder)   . Depression    AXIS IV:  other psychosocial or environmental problems AXIS V:  61-70 mild symptoms  Plan Of Care/Follow-up recommendations:  Activity:  as tolerated Diet:  regular [facility] Is patient on multiple antipsychotic therapies at discharge:  No   Has Patient had three or more failed trials of antipsychotic monotherapy by history:  No  Recommended Plan for Multiple Antipsychotic Therapies: NA    Jasmine FeeIrving A Nafeesa [name] 04/03/2014, 12:43 PM

## 2014-04-03 NOTE — Tx Team (Signed)
Interdisciplinary Treatment Plan Update (Adult)  Date: 04/03/2014  Time Reviewed:  9:45 AM  Progress in Treatment: Attending groups: Yes Participating in groups:  Yes Taking medication as prescribed:  Yes Tolerating medication:  Yes Family/Significant othe contact made: Yes, with pt's sister Patient understands diagnosis:  Yes Discussing patient identified problems/goals with staff:  Yes Medical problems stabilized or resolved:  Yes Denies suicidal/homicidal ideation: Yes Issues/concerns per patient self-inventory:  Yes Other:  New problem(s) identified: N/A  Discharge Plan or Barriers: Pt will [facility] at [facility] for therapy and medication management.    Reason for Continuation of Hospitalization: Stable to d/c today  Comments: N/A  Estimated length of stay: D/C today  For review of initial/current patient goals, please see plan of care.  Attendees: Patient:  Jasmine [name]  04/03/2014 10:24 AM   Family:     Physician:  04/03/2014 10:24 AM   Nursing:   Quintella ReichertBeverly Knight, RN 04/03/2014 10:24 AM   Clinical Social Worker:  Reyes Ivanhelsea Horton, LCSW 04/03/2014 10:24 AM   Other: Verne SpurrNeil Mashburn, PA 04/03/2014 10:24 AM   Other:  Sherrye PayorValerie Noch, care coordination 04/03/2014 10:24 AM   Other:  Marzetta Boardhrista Dopson, RN 04/03/2014 10:24 AM   Other:  Onnie BoerJennifer Clark, case manager 04/03/2014 10:25 AM   Other: Anthony SarAlvin Oung, pharmacy resident 04/03/2014 10:25 AM   Other:    Other:    Other:    Other:      Scribe for Treatment Team:   Carmina MillerHorton, Miciah Shealy Nicole, 04/03/2014 , 10:24 AM

## 2014-04-06 NOTE — Progress Notes (Signed)
Patient Discharge Instructions:  After Visit Summary (AVS):   Faxed to:  04/06/14 Discharge Summary Note:   Faxed to:  04/06/14 Psychiatric Admission Assessment Note:   Faxed to:  04/06/14 Suicide Risk Assessment - Discharge Assessment:   Faxed to:  04/06/14 Faxed/Sent to the Next Level Care provider:  04/06/14 Faxed to [facility] @ [phone]  Jerelene ReddenSheena E [place], 04/06/2014, 4:12 PM

## 2014-09-23 ENCOUNTER — Emergency Department (HOSPITAL_COMMUNITY)
Admission: EM | Admit: 2014-09-23 | Discharge: 2014-09-24 | Disposition: A | Discharge status: Home / Self Care | Attending: Emergency Medicine | Admitting: Emergency Medicine

## 2014-09-23 ENCOUNTER — Emergency Department (HOSPITAL_COMMUNITY)

## 2014-09-23 ENCOUNTER — Encounter (HOSPITAL_COMMUNITY): Payer: Self-pay | Admitting: Emergency Medicine

## 2014-09-23 DIAGNOSIS — M1990 Unspecified osteoarthritis, unspecified site: Secondary | ICD-10-CM | POA: Diagnosis not present

## 2014-09-23 DIAGNOSIS — E860 Dehydration: Secondary | ICD-10-CM | POA: Insufficient documentation

## 2014-09-23 DIAGNOSIS — Z3202 Encounter for pregnancy test, result negative: Secondary | ICD-10-CM | POA: Diagnosis not present

## 2014-09-23 DIAGNOSIS — Y939 Activity, unspecified: Secondary | ICD-10-CM | POA: Diagnosis not present

## 2014-09-23 DIAGNOSIS — S0990XA Unspecified injury of head, initial encounter: Secondary | ICD-10-CM | POA: Insufficient documentation

## 2014-09-23 DIAGNOSIS — W1830XA Fall on same level, unspecified, initial encounter: Secondary | ICD-10-CM | POA: Insufficient documentation

## 2014-09-23 DIAGNOSIS — Y9289 Other specified places as the place of occurrence of the external cause: Secondary | ICD-10-CM | POA: Diagnosis not present

## 2014-09-23 DIAGNOSIS — R400 Somnolence: Secondary | ICD-10-CM | POA: Diagnosis present

## 2014-09-23 DIAGNOSIS — Y990 Civilian activity done for income or pay: Secondary | ICD-10-CM | POA: Diagnosis not present

## 2014-09-23 DIAGNOSIS — F419 Anxiety disorder, unspecified: Secondary | ICD-10-CM | POA: Diagnosis not present

## 2014-09-23 DIAGNOSIS — G43909 Migraine, unspecified, not intractable, without status migrainosus: Secondary | ICD-10-CM | POA: Insufficient documentation

## 2014-09-23 DIAGNOSIS — F329 Major depressive disorder, single episode, unspecified: Secondary | ICD-10-CM | POA: Diagnosis not present

## 2014-09-23 DIAGNOSIS — R55 Syncope and collapse: Secondary | ICD-10-CM | POA: Diagnosis not present

## 2014-09-23 LAB — URINALYSIS, ROUTINE W REFLEX MICROSCOPIC
Bilirubin Urine: NEGATIVE
Glucose, UA: NEGATIVE mg/dL
Hgb urine dipstick: NEGATIVE
KETONES UR: 40 mg/dL — AB
LEUKOCYTES UA: NEGATIVE
NITRITE: NEGATIVE
PH: 7 (ref 5.0–8.0)
Protein, ur: NEGATIVE mg/dL
Specific Gravity, Urine: 1.016 (ref 1.005–1.030)
Urobilinogen, UA: 1 mg/dL (ref 0.0–1.0)

## 2014-09-23 LAB — CBC WITH DIFFERENTIAL/PLATELET
BASOS ABS: 0 10*3/uL (ref 0.0–0.1)
BASOS PCT: 0 % (ref 0–1)
EOS PCT: 6 % — AB (ref 0–5)
Eosinophils Absolute: 0.4 10*3/uL (ref 0.0–0.7)
HCT: 36.1 % (ref 36.0–46.0)
Hemoglobin: 12.2 g/dL (ref 12.0–15.0)
LYMPHS PCT: 46 % (ref 12–46)
Lymphs Abs: 2.7 10*3/uL (ref 0.7–4.0)
MCH: 29.8 pg (ref 26.0–34.0)
MCHC: 33.8 g/dL (ref 30.0–36.0)
MCV: 88 fL (ref 78.0–100.0)
MONO ABS: 0.4 10*3/uL (ref 0.1–1.0)
Monocytes Relative: 6 % (ref 3–12)
Neutro Abs: 2.5 10*3/uL (ref 1.7–7.7)
Neutrophils Relative %: 42 % — ABNORMAL LOW (ref 43–77)
PLATELETS: 297 10*3/uL (ref 150–400)
RBC: 4.1 MIL/uL (ref 3.87–5.11)
RDW: 12.8 % (ref 11.5–15.5)
WBC: 5.9 10*3/uL (ref 4.0–10.5)

## 2014-09-23 LAB — COMPREHENSIVE METABOLIC PANEL
ALT: 16 U/L (ref 0–35)
AST: 19 U/L (ref 0–37)
Albumin: 4 g/dL (ref 3.5–5.2)
Alkaline Phosphatase: 63 U/L (ref 39–117)
Anion gap: 16 — ABNORMAL HIGH (ref 5–15)
BUN: 14 mg/dL (ref 6–23)
CO2: 20 mEq/L (ref 19–32)
Calcium: 9.1 mg/dL (ref 8.4–10.5)
Chloride: 102 mEq/L (ref 96–112)
Creatinine, Ser: 0.84 mg/dL (ref 0.50–1.10)
GFR calc Af Amer: >90 mL/min (ref >90)
GFR calc non Af Amer: 86 mL/min — ABNORMAL LOW (ref >90)
Glucose, Bld: 81 mg/dL (ref 70–99)
Potassium: 3.7 mEq/L (ref 3.7–5.3)
Sodium: 138 mEq/L (ref 137–147)
Total Bilirubin: 0.4 mg/dL (ref 0.3–1.2)
Total Protein: 7.6 g/dL (ref 6.0–8.3)

## 2014-09-23 LAB — PREGNANCY, URINE: PREG TEST UR: NEGATIVE

## 2014-09-23 LAB — TROPONIN I: Troponin I: <0.3 ng/mL (ref <0.30)

## 2014-09-23 IMAGING — CT CT HEAD W/O CM
2 of 5 series · 12 of 47 positions shown, 15 images · non-contrast
Comparison: None.

CLINICAL DATA: Syncopal episode at work, landed on face, severe
headache and facial sensitivity, with numbness. Dizziness.

EXAM:
CT HEAD WITHOUT CONTRAST
CT CERVICAL SPINE WITHOUT CONTRAST
TECHNIQUE: Multidetector CT imaging of the head and cervical spine was
performed following the standard protocol without intravenous
contrast. Multiplanar CT image reconstructions of the cervical spine
were also generated.

[Series 7: coronals · coronal · 0.19mm/px · 3 of 35 slices shown]
[im 12/35  brain]
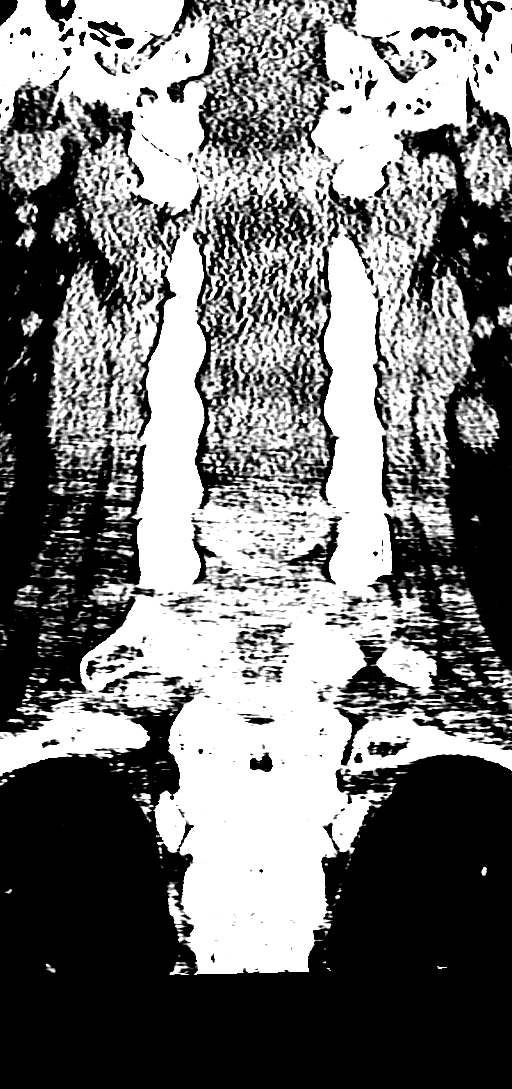
[im 16/35  brain]
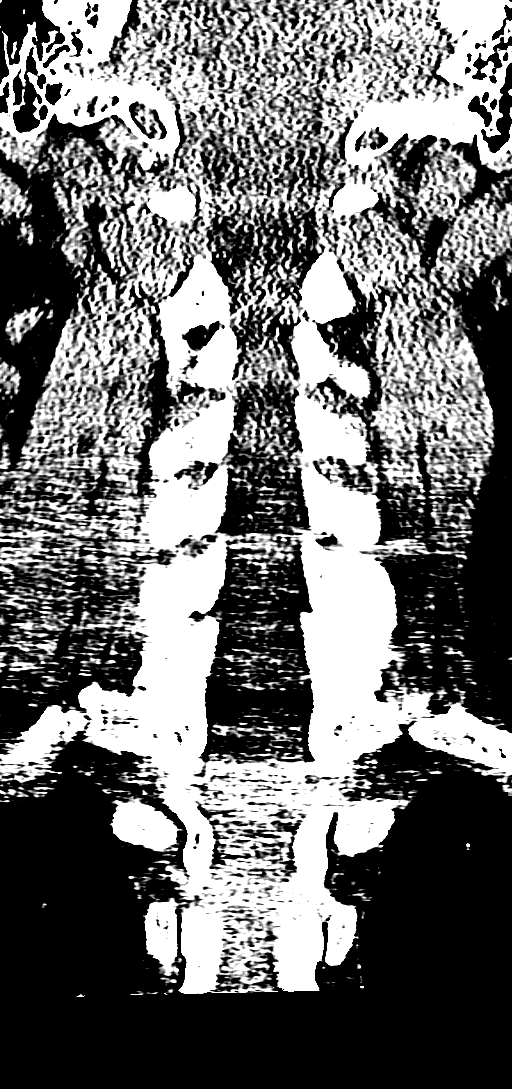
[im 19/35  brain]
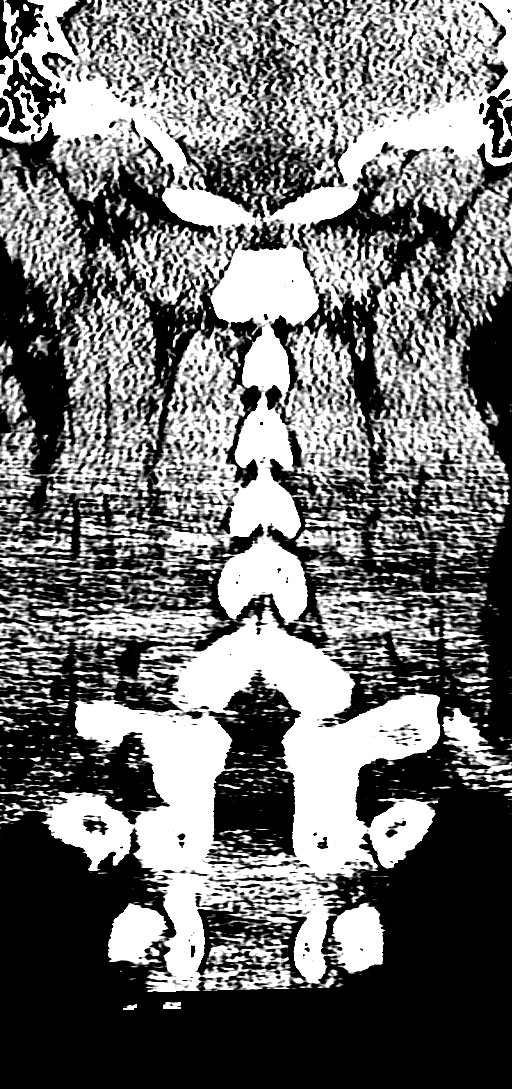

[Series 8: orthogonals · axial · 0.21mm/px · z∈[-239,-107]mm · 9 of 88 slices shown, 12 images]
[im 8/88  brain]
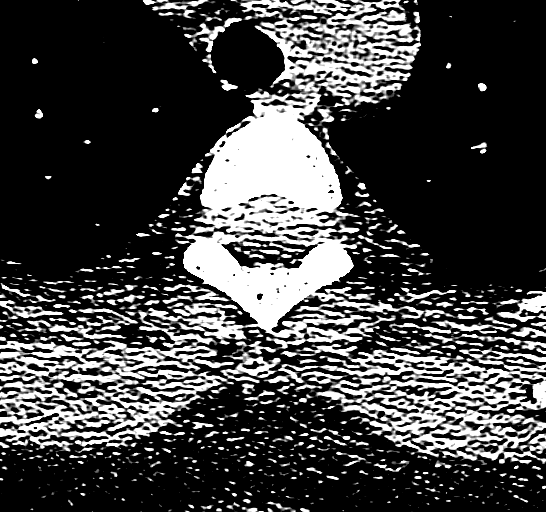
[im 8/88  bone]
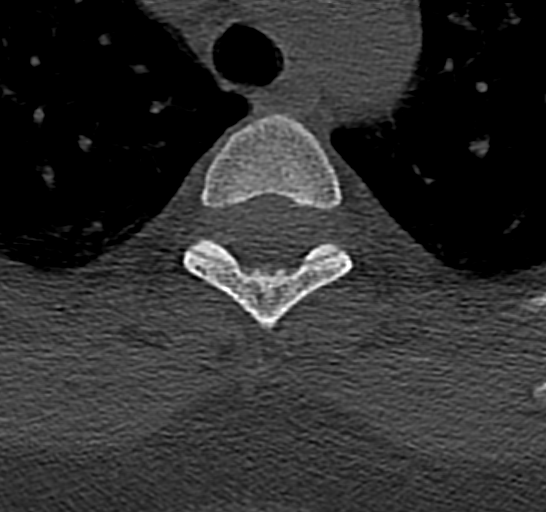
[im 15/88  brain]
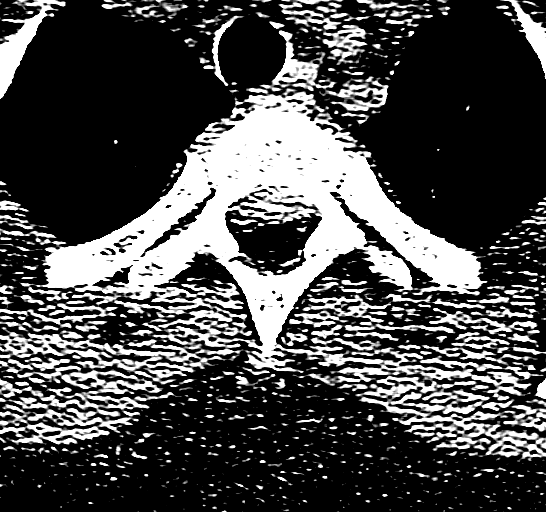
[im 30/88  brain]
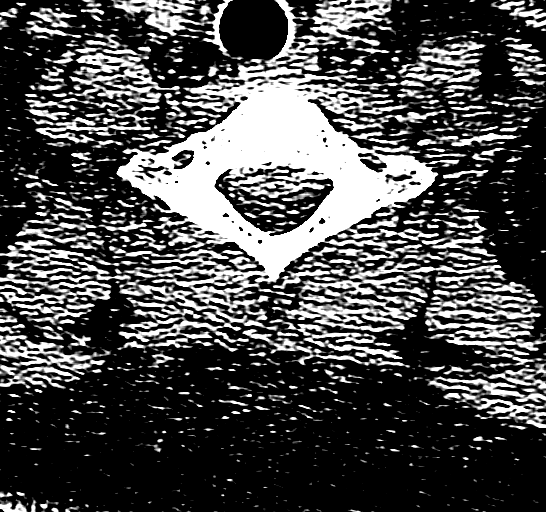
[im 37/88  brain]
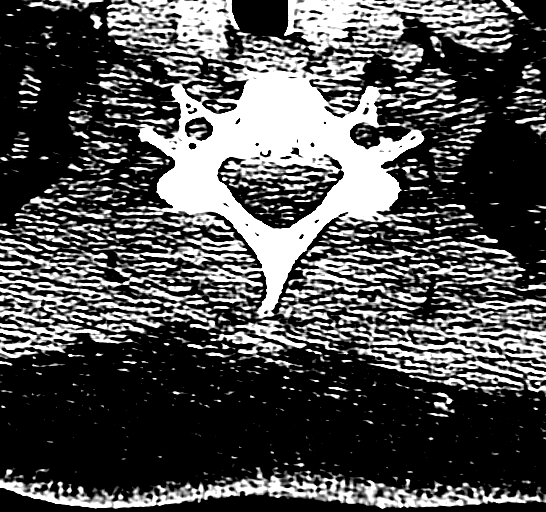
[im 44/88  brain]
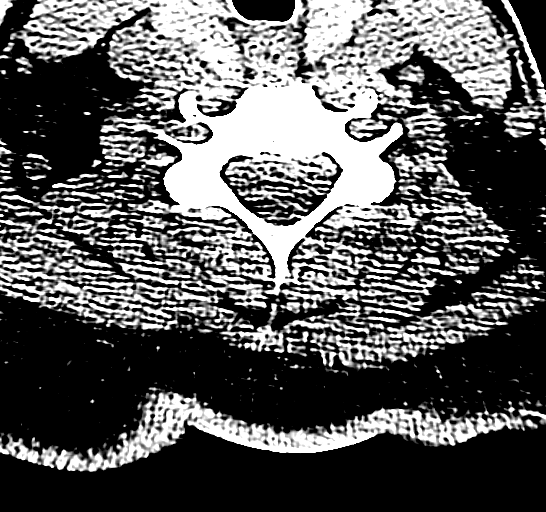
[im 44/88  bone]
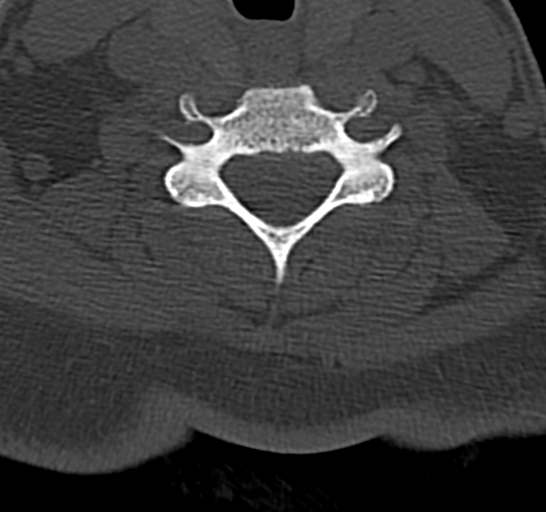
[im 51/88  brain]
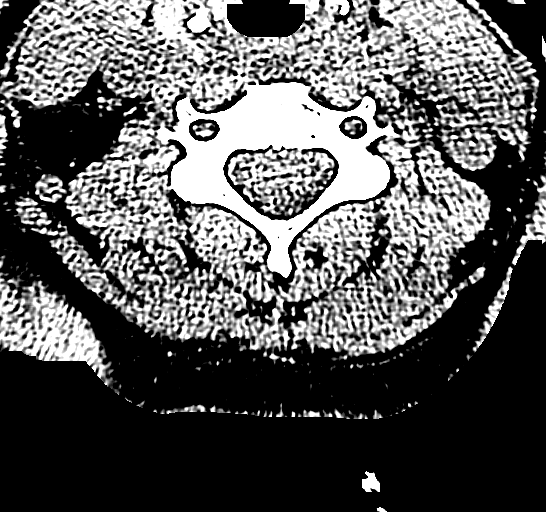
[im 59/88  brain]
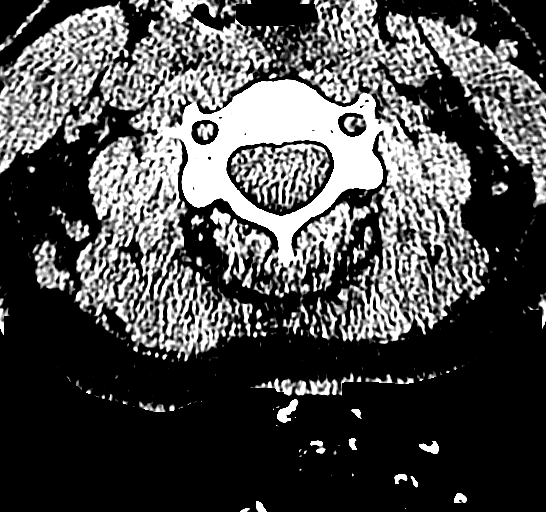
[im 73/88  brain]
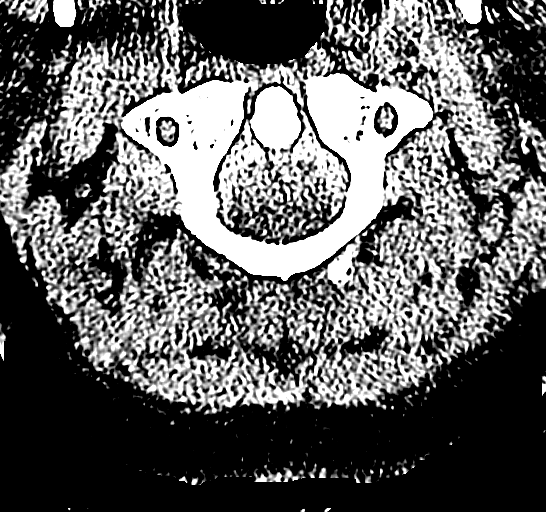
[im 80/88  brain]
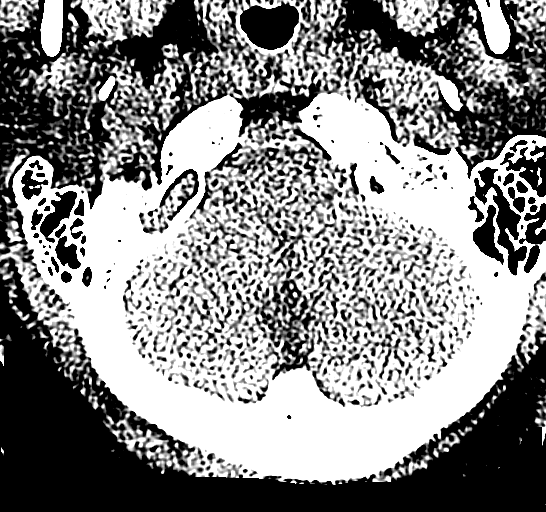
[im 80/88  bone]
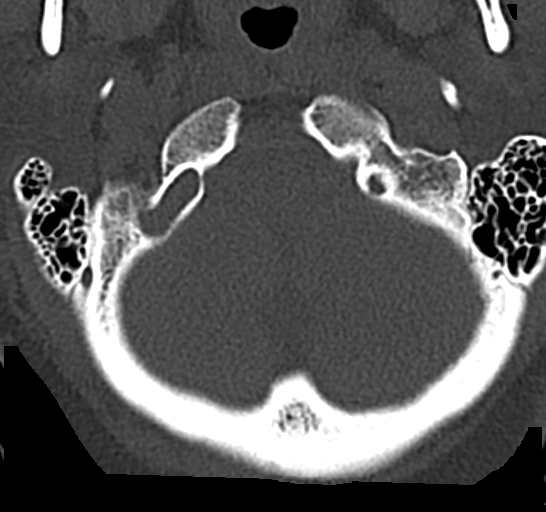

[12 of 47 positions shown; findings below may reference images not displayed]

FINDINGS: CT HEAD FINDINGS

The ventricles and sulci are normal. No intraparenchymal hemorrhage,
mass effect nor midline shift. No acute large vascular territory
infarcts.

No abnormal extra-axial fluid collections. Basal cisterns are
patent.

No skull fracture. The included ocular globes and orbital contents
are non-suspicious. The mastoid aircells and included paranasal
sinuses are well-aerated.

CT CERVICAL SPINE FINDINGS

Cervical vertebral bodies and posterior elements are intact and
aligned with straightened cervical lordosis. Moderate C5-6 disc
degeneration with endplate spurring, mild at C3-4, C4-5. No
destructive bony lesions. C1-2 articulation maintained. Prominent
jugulodigastric lymph nodes, including 10 mm short axis LEFT level 3
lymph node may be reactive.
IMPRESSION: CT HEAD: No acute intracranial process. Normal noncontrast CT of the
head.

CT CERVICAL SPINE: Straightened cervical lordosis without acute
fracture or malalignment.

  By: PETIT-HOMME

## 2014-09-23 MED ORDER — HYDROCODONE-ACETAMINOPHEN 5-325 MG PO TABS: 2.0000 | ORAL_TABLET | ORAL | Status: DC | PRN

## 2014-09-23 MED ORDER — ACETAMINOPHEN 500 MG PO TABS
1000.0000 mg | ORAL_TABLET | Freq: Once | ORAL | Status: AC
  Administered 2014-09-23: 1000 mg via ORAL
  Filled 2014-09-23: qty 2

## 2014-09-23 MED ORDER — NAPROXEN 500 MG PO TABS: 500.0000 mg | ORAL_TABLET | Freq: Two times a day (BID) | ORAL | Status: DC

## 2014-09-23 NOTE — ED Notes (Signed)
Pt. Was at work in a coffee shop. Pt. Had a syncopal episode, fell face first on the concrete ground. Pt. Had LOC. Upon EMS arrival pt. Was still lethargic. C collar applied. When pt. Regained consciousness, pt. C/o splitting headache, dizziness, light sensitivity and nausea.

## 2014-09-23 NOTE — ED Notes (Signed)
Jasmine [name], sister- [phone] Jasmine [name], mother- [phone]

## 2014-09-23 NOTE — ED Notes (Signed)
Pt family called this RN to bedside, stated patient was not responding- pt responded to sternal rub and became alert, answering questions approprietly, VSS, c/o headache but states dizziness has improved, pupils equil and reactive, moving all extremities. Will call CT for update on timeframe.

## 2014-09-23 NOTE — ED Provider Notes (Signed)
CSN: [id number]     Arrival date & time 09/23/14  2025 History   First MD Initiated Contact with Patient 09/23/14 2031     Chief Complaint  Patient presents with  . Loss of Consciousness     (Consider location/radiation/quality/duration/timing/severity/associated sxs/prior Treatment) HPI Comments: The patient is a 38 year old female, history of ectopic pregnancy, history of migraines and a history of posttraumatic stress disorder and anxiety. She presents to the hospital after she had a syncopal episode at work. She reports seeing black spots and subsequently looking up from her back to see people around her. She does not remember passing out, she denies prodromal palpitations chest pain or shortness of breath and has had no other symptoms recently including lack of appetite, nausea, vomiting, diarrhea rectal bleeding, abdominal tenderness, chest pain, cough, shortness of breath. This happened just prior to arrival, reportedly the patient fell face first into the ground onto cement surface and according to the paramedic she had a decreased level of consciousness when they arrived several minutes after this occurred. In the ambulance she developed severe headache and acute onset of vertigo. There was no vomiting. The patient was immobilized with a cervical collar and a backboard prior to arrival  Patient is a 38 y.o. female presenting with syncope. The history is provided by the patient and the EMS personnel.  Loss of Consciousness   Past Medical History  Diagnosis Date  . Ectopic pregnancy   . Migraine   . Osteoarthritis   . Anxiety   . PTSD (post-traumatic stress disorder)   . Depression    History reviewed. No pertinent past surgical history. History reviewed. No pertinent family history. History  Substance Use Topics  . Smoking status: Never Smoker   . Smokeless tobacco: Never Used  . Alcohol Use: No   OB History   Grav Para Term Preterm Abortions TAB SAB Ect Mult Living            Review of Systems  Cardiovascular: Positive for syncope.  All other systems reviewed and are negative.     Allergies  Review of patient's allergies indicates no known allergies.  Home Medications   Prior to Admission medications   Medication Sig Start Date End Date Taking? Authorizing Provider  buPROPion (WELLBUTRIN) 100 MG tablet Take 100 mg by mouth daily.   Yes Historical Provider, MD  celecoxib (CELEBREX) 200 MG capsule Take 200 mg by mouth daily.   Yes Historical Provider, MD  escitalopram (LEXAPRO) 10 MG tablet Take 10 mg by mouth daily.   Yes Historical Provider, MD  traZODone (DESYREL) 50 MG tablet Take 50 mg by mouth at bedtime.   Yes Historical Provider, MD  HYDROcodone-acetaminophen (NORCO/VICODIN) 5-325 MG per tablet Take 2 tablets by mouth every 4 (four) hours as needed. 09/23/14   Vida RollerBrian D Vontrell Pullman, MD  naproxen (NAPROSYN) 500 MG tablet Take 1 tablet (500 mg total) by mouth 2 (two) times daily with a meal. 09/23/14   Vida RollerBrian D Abdirizak Richison, MD   BP 112/78  Pulse 77  Temp(Src) 98.1 F (36.7 C) (Oral)  Resp 15  SpO2 98%  LMP 09/02/2014 Physical Exam  Nursing note and vitals reviewed. Constitutional: She appears well-developed and well-nourished. No distress.  HENT:  Head: Normocephalic and atraumatic.  Mouth/Throat: Oropharynx is clear and moist. No oropharyngeal exudate.  No malocclusion, no hemotympanum, no raccoon eyes, no battle sign, there is tenderness over the forehead and bilateral maxillary bones, no signs of hematomas or contusions or lacerations  Eyes: Conjunctivae and  EOM are normal. Pupils are equal, round, and reactive to light. Right eye exhibits no discharge. Left eye exhibits no discharge. No scleral icterus.  Photophobia present, normal pupillary exam extraocular movements and conjunctiva  Neck: Normal range of motion. Neck supple. No JVD present. No thyromegaly present.  Cardiovascular: Normal rate, regular rhythm, normal heart sounds and intact  distal pulses.  Exam reveals no gallop and no friction rub.   No murmur heard. Pulmonary/Chest: Effort normal and breath sounds normal. No respiratory distress. She has no wheezes. She has no rales.  Abdominal: Soft. Bowel sounds are normal. She exhibits no distension and no mass. There is no tenderness.  Musculoskeletal: Normal range of motion. She exhibits no edema and no tenderness.  No deformities or tenderness, joints are supple, compartments are soft diffusely  Lymphadenopathy:    She has no cervical adenopathy.  Neurological: She is alert. Coordination normal.  Moves all extremities x4 without any difficulty, normal finger-nose-finger, strength and sensation, cranial nerves III through XII intact  Skin: Skin is warm and dry. No rash noted. No erythema.  Psychiatric: She has a normal mood and affect. Her behavior is normal.    ED Course  Procedures (including critical care time) Labs Review Labs Reviewed  CBC WITH DIFFERENTIAL - Abnormal; Notable for the following:    Neutrophils Relative % 42 (*)    Eosinophils Relative 6 (*)    All other components within normal limits  COMPREHENSIVE METABOLIC PANEL - Abnormal; Notable for the following:    GFR calc non Af Amer 86 (*)    Anion gap 16 (*)    All other components within normal limits  URINALYSIS, ROUTINE W REFLEX MICROSCOPIC - Abnormal; Notable for the following:    Ketones, ur 40 (*)    All other components within normal limits  TROPONIN I  PREGNANCY, URINE    Imaging Review Ct Head Wo Contrast  09/23/2014   CLINICAL DATA:  Syncopal episode at work, landed on face, severe headache and facial sensitivity, with numbness. Dizziness.  EXAM: CT HEAD WITHOUT CONTRAST  CT CERVICAL SPINE WITHOUT CONTRAST  TECHNIQUE: Multidetector CT imaging of the head and cervical spine was performed following the standard protocol without intravenous contrast. Multiplanar CT image reconstructions of the cervical spine were also generated.   COMPARISON:  None.  FINDINGS: CT HEAD FINDINGS  The ventricles and sulci are normal. No intraparenchymal hemorrhage, mass effect nor midline shift. No acute large vascular territory infarcts.  No abnormal extra-axial fluid collections. Basal cisterns are patent.  No skull fracture. The included ocular globes and orbital contents are non-suspicious. The mastoid aircells and included paranasal sinuses are well-aerated.  CT CERVICAL SPINE FINDINGS  Cervical vertebral bodies and posterior elements are intact and aligned with straightened cervical lordosis. Moderate C5-6 disc degeneration with endplate spurring, mild at C3-4, C4-5. No destructive bony lesions. C1-2 articulation maintained. Prominent jugulodigastric lymph nodes, including 10 mm short axis LEFT level 3 lymph node may be reactive.  IMPRESSION: CT HEAD: No acute intracranial process. Normal noncontrast CT of the head.  CT CERVICAL SPINE: Straightened cervical lordosis without acute fracture or malalignment.   Electronically Signed   By: Awilda Metro   On: 09/23/2014 22:10   Ct Cervical Spine Wo Contrast  09/23/2014   CLINICAL DATA:  Syncopal episode at work, landed on face, severe headache and facial sensitivity, with numbness. Dizziness.  EXAM: CT HEAD WITHOUT CONTRAST  CT CERVICAL SPINE WITHOUT CONTRAST  TECHNIQUE: Multidetector CT imaging of the head and  cervical spine was performed following the standard protocol without intravenous contrast. Multiplanar CT image reconstructions of the cervical spine were also generated.  COMPARISON:  None.  FINDINGS: CT HEAD FINDINGS  The ventricles and sulci are normal. No intraparenchymal hemorrhage, mass effect nor midline shift. No acute large vascular territory infarcts.  No abnormal extra-axial fluid collections. Basal cisterns are patent.  No skull fracture. The included ocular globes and orbital contents are non-suspicious. The mastoid aircells and included paranasal sinuses are well-aerated.  CT  CERVICAL SPINE FINDINGS  Cervical vertebral bodies and posterior elements are intact and aligned with straightened cervical lordosis. Moderate C5-6 disc degeneration with endplate spurring, mild at C3-4, C4-5. No destructive bony lesions. C1-2 articulation maintained. Prominent jugulodigastric lymph nodes, including 10 mm short axis LEFT level 3 lymph node may be reactive.  IMPRESSION: CT HEAD: No acute intracranial process. Normal noncontrast CT of the head.  CT CERVICAL SPINE: Straightened cervical lordosis without acute fracture or malalignment.   Electronically Signed   By: Awilda Metroourtnay  Bloomer   On: 09/23/2014 22:10     EKG Interpretation   Date/Time:  Saturday September 23 2014 20:43:56 EDT Ventricular Rate:  69 PR Interval:  161 QRS Duration: 88 QT Interval:  448 QTC Calculation: 480 R Axis:   91 Text Interpretation:  Sinus or ectopic atrial rhythm Borderline right axis  deviation Abnormal ekg No old tracing to compare Confirmed by Lakyn Mantione  MD,  Shemicka Cohrs ([id number]) on 09/23/2014 10:26:33 PM      MDM   Final diagnoses:  Syncope, unspecified syncope type  Head injury, initial encounter  Dehydration    The patient has a normal exam at this time. After we move her to the bed and take her off the backboard she states that she no longer has dizziness but still has a pounding headache. Will workup for 60, she will also need a CT scan of the head given head injury and severe headache.  On repeat exam after imaging (neg) - no findings of concern ambulated, has tolerated oral fluids, has received some pain medications and feels better, she has been informed of her results and is stable for discharge.   Meds given in ED:  Medications  acetaminophen (TYLENOL) tablet 1,000 mg (1,000 mg Oral Given 09/23/14 2235)    New Prescriptions   HYDROCODONE-ACETAMINOPHEN (NORCO/VICODIN) 5-325 MG PER TABLET    Take 2 tablets by mouth every 4 (four) hours as needed.   NAPROXEN (NAPROSYN) 500 MG TABLET     Take 1 tablet (500 mg total) by mouth 2 (two) times daily with a meal.      Vida RollerBrian D Kiing Deakin, MD 09/23/14 [phone]

## 2014-09-23 NOTE — Discharge Instructions (Signed)
Please call your doctor for a followup appointment within 24-48 hours. When you talk to your doctor please let them know that you were seen in the emergency department and have them acquire all of your records so that they can discuss the findings with you and formulate a treatment plan to fully care for your new and ongoing problems. ° °

## 2015-03-09 ENCOUNTER — Emergency Department (INDEPENDENT_AMBULATORY_CARE_PROVIDER_SITE_OTHER)
Admission: EM | Admit: 2015-03-09 | Discharge: 2015-03-09 | Disposition: A | Discharge status: Home / Self Care | Source: Home / Self Care | Attending: Family Medicine | Admitting: Family Medicine

## 2015-03-09 ENCOUNTER — Encounter (HOSPITAL_COMMUNITY): Payer: Self-pay | Admitting: *Deleted

## 2015-03-09 DIAGNOSIS — G43009 Migraine without aura, not intractable, without status migrainosus: Secondary | ICD-10-CM

## 2015-03-09 HISTORY — DX: Plantar fascial fibromatosis: M72.2

## 2015-03-09 MED ORDER — DEXAMETHASONE SODIUM PHOSPHATE 10 MG/ML IJ SOLN
INTRAMUSCULAR | Status: AC
  Filled 2015-03-09: qty 1

## 2015-03-09 MED ORDER — DIPHENHYDRAMINE HCL 50 MG/ML IJ SOLN
25.0000 mg | Freq: Once | INTRAMUSCULAR | Status: AC
  Administered 2015-03-09: 25 mg via INTRAMUSCULAR

## 2015-03-09 MED ORDER — KETOROLAC TROMETHAMINE 60 MG/2ML IM SOLN
INTRAMUSCULAR | Status: AC
  Filled 2015-03-09: qty 2

## 2015-03-09 MED ORDER — ONDANSETRON HCL 8 MG PO TABS
8.0000 mg | ORAL_TABLET | Freq: Once | ORAL | Status: AC
  Administered 2015-03-09: 8 mg via ORAL

## 2015-03-09 MED ORDER — DEXAMETHASONE SODIUM PHOSPHATE 10 MG/ML IJ SOLN
5.0000 mg | Freq: Once | INTRAMUSCULAR | Status: AC
  Administered 2015-03-09: 5 mg via INTRAMUSCULAR

## 2015-03-09 MED ORDER — DIPHENHYDRAMINE HCL 50 MG/ML IJ SOLN
INTRAMUSCULAR | Status: AC
  Filled 2015-03-09: qty 1

## 2015-03-09 MED ORDER — ONDANSETRON HCL 4 MG PO TABS
4.0000 mg | ORAL_TABLET | Freq: Four times a day (QID) | ORAL | Status: DC
Start: 2015-03-09 — End: 2016-03-04

## 2015-03-09 MED ORDER — ONDANSETRON 4 MG PO TBDP
ORAL_TABLET | ORAL | Status: AC
  Filled 2015-03-09: qty 2

## 2015-03-09 MED ORDER — KETOROLAC TROMETHAMINE 60 MG/2ML IM SOLN
60.0000 mg | Freq: Once | INTRAMUSCULAR | Status: AC
  Administered 2015-03-09: 60 mg via INTRAMUSCULAR

## 2015-03-09 NOTE — ED Notes (Signed)
C/o headache onset yesterday.  She has not tried anything for it. C/o N and V x 2 last night and 2 x today.  C/o photophobia.  Seeing black spots with light behind them.  Has had this before with her migraines  She used to take Maxalt but ran out.

## 2015-03-09 NOTE — Discharge Instructions (Signed)

## 2015-03-09 NOTE — ED Provider Notes (Signed)
CSN: [id number]     Arrival date & time 03/09/15  1652 History   First MD Initiated Contact with Patient 03/09/15 1823     Chief Complaint  Patient presents with  . Headache   (Consider location/radiation/quality/duration/timing/severity/associated sxs/prior Treatment) HPI Comments: 39 year old female complaining of a migraine type headache since chest today. She states that the pain initially was on the right hemicranium now in the left. She describes it as a throbbing piercing type pain. Is associated with photophobia, sonophobia, nausea and vomiting, blurring of vision in the right eye. Last evening while watching TV she states that she had a visual field loss of the right eye and the right periphery. This was transient. She complains of having dark spots with bright lights behind the dark spots in her eyes. She is complaining of dizziness and ataxia.   Past Medical History  Diagnosis Date  . Ectopic pregnancy   . Migraine   . Osteoarthritis   . Anxiety   . PTSD (post-traumatic stress disorder)   . Depression   . Plantar fasciitis    Past Surgical History  Procedure Laterality Date  . Ectopic pregnancy surgery  10/2000  . Ectopic pregnancy surgery  01/2004   Family History  Problem Relation Age of Onset  . Hypertension Mother   . Seizures Mother    History  Substance Use Topics  . Smoking status: Never Smoker   . Smokeless tobacco: Never Used  . Alcohol Use: No   OB History    No data available     Review of Systems  Constitutional: Positive for activity change and appetite change. Negative for fever.  HENT: Negative for congestion, ear pain, postnasal drip, rhinorrhea and sore throat.   Respiratory: Negative.  Negative for shortness of breath.   Cardiovascular: Negative for chest pain.  Gastrointestinal: Positive for nausea and vomiting. Negative for abdominal pain.  Genitourinary: Negative.   Skin: Negative.   Neurological: Positive for dizziness and headaches.  Negative for tremors, seizures, syncope, facial asymmetry and speech difficulty.  Psychiatric/Behavioral: Positive for sleep disturbance. Negative for confusion and agitation.    Allergies  Review of patient's allergies indicates no known allergies.  Home Medications   Prior to Admission medications   Medication Sig Start Date End Date Taking? Authorizing Provider  buPROPion (WELLBUTRIN) 100 MG tablet Take 100 mg by mouth daily.   Yes Historical Provider, MD  escitalopram (LEXAPRO) 10 MG tablet Take 10 mg by mouth daily.   Yes Historical Provider, MD  traZODone (DESYREL) 50 MG tablet Take 50 mg by mouth at bedtime.   Yes Historical Provider, MD  celecoxib (CELEBREX) 200 MG capsule Take 200 mg by mouth daily.    Historical Provider, MD  HYDROcodone-acetaminophen (NORCO/VICODIN) 5-325 MG per tablet Take 2 tablets by mouth every 4 (four) hours as needed. 09/23/14   Eber HongBrian Miller, MD  naproxen (NAPROSYN) 500 MG tablet Take 1 tablet (500 mg total) by mouth 2 (two) times daily with a meal. 09/23/14   Eber HongBrian Miller, MD   BP 108/65 mmHg  Pulse 83  Temp(Src) 98 F (36.7 C) (Oral)  Resp 16  SpO2 96%  LMP 03/05/2015 Physical Exam  Constitutional: She is oriented to person, place, and time. She appears well-developed and well-nourished. No distress.  Patient appears uncomfortable. Due to the photophobia and dizziness is difficult to complete a neurologic exam. She prefers not to open her eyes for visualization or testing finger to nose.  HENT:  Head: Normocephalic and atraumatic.  Uvula midline.  Tongue midline. Soft palate rises symmetrically. No intraoral lesions or swelling.  Eyes: Conjunctivae and EOM are normal. Pupils are equal, round, and reactive to light.  Neck: Normal range of motion. Neck supple.  Cardiovascular: Normal rate and normal heart sounds.   Pulmonary/Chest: Effort normal and breath sounds normal.  Musculoskeletal: She exhibits no edema.  Neurological: She is alert and  oriented to person, place, and time. She has normal strength. She displays no tremor. No cranial nerve deficit or sensory deficit. She exhibits normal muscle tone. She displays no seizure activity. Gait abnormal.  Positive Romberg. Unable to perform heel to toe without losing balance Slow in performing finger-to-nose testing She states this is due to her dizziness.  Nursing note and vitals reviewed.   ED Course  Procedures (including critical care time) Labs Review Labs Reviewed - No data to display  Imaging Review No results found.   MDM   1. Migraine without aura and without status migrainosus, not intractable    Home and rest. Follow with your PCP is on page one Patient administered Decadron 5 mg IM, Toradol 60 mg IM, Benadryl 25 mg IM and Zofran 8 mg by mouth. Zofran 4 mg prescription. Per dizziness try meclizine. For worsening new symptoms or problems may need to go to the emergency department. Approximate 40 minutes after the patient received the above medication she states that she felt better. She is able to keep her eyes open without photophobia, nauseous better and she feels better in general. She states the headache is not completely gone significantly improved.   Hayden Rasmussen, NP 03/09/15 [phone]

## 2015-03-14 ENCOUNTER — Other Ambulatory Visit: Payer: Self-pay | Admitting: Family Medicine

## 2015-03-14 DIAGNOSIS — N6315 Unspecified lump in the right breast, overlapping quadrants: Secondary | ICD-10-CM

## 2015-03-14 DIAGNOSIS — N6310 Unspecified lump in the right breast, unspecified quadrant: Principal | ICD-10-CM

## 2015-03-16 ENCOUNTER — Ambulatory Visit
Admission: RE | Admit: 2015-03-16 | Discharge: 2015-03-16 | Disposition: A | Discharge status: Home / Self Care | Source: Ambulatory Visit | Attending: Family Medicine | Admitting: Family Medicine

## 2015-03-16 DIAGNOSIS — N6315 Unspecified lump in the right breast, overlapping quadrants: Secondary | ICD-10-CM

## 2015-03-16 DIAGNOSIS — N6310 Unspecified lump in the right breast, unspecified quadrant: Principal | ICD-10-CM

## 2015-03-16 IMAGING — MG MM DIAG BREAST TOMO BILATERAL
6 of 10 series · 6 of 30 positions shown · non-contrast
Comparison: None.

CLINICAL DATA: Mass felt by the patient in the outer right breast
for the past 3 weeks.

EXAM:
DIGITAL DIAGNOSTIC BILATERAL MAMMOGRAM WITH 3D TOMOSYNTHESIS WITH
CAD
ULTRASOUND RIGHT BREAST

[R TAN]
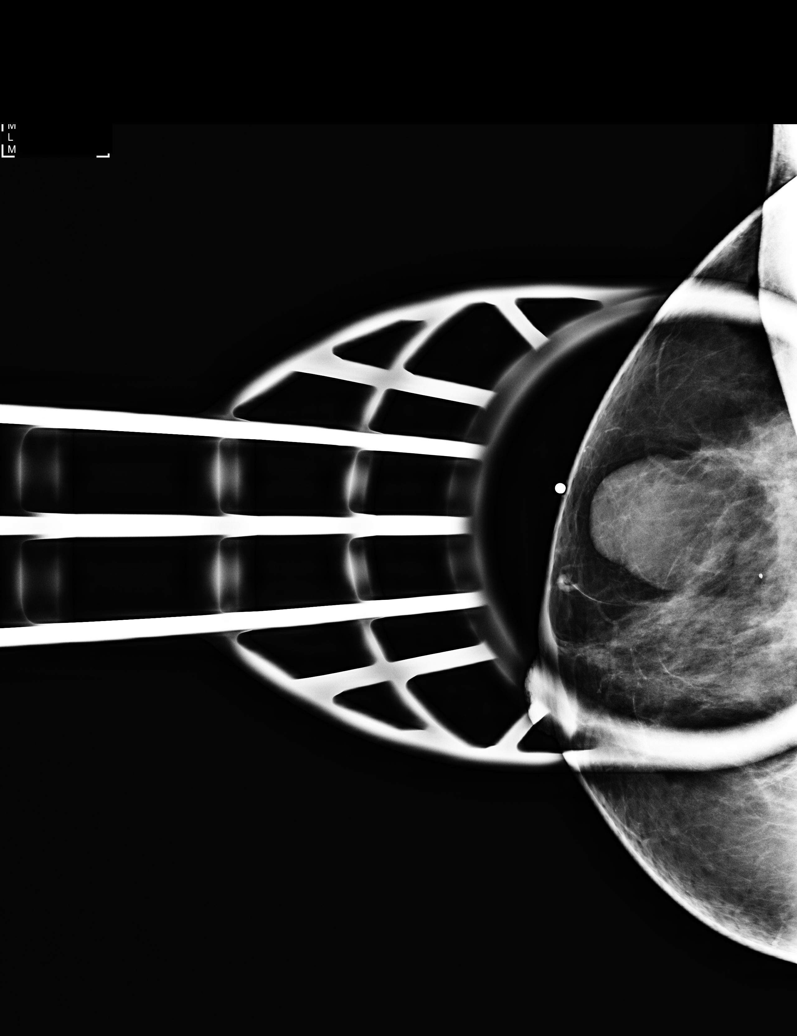

[L CC]
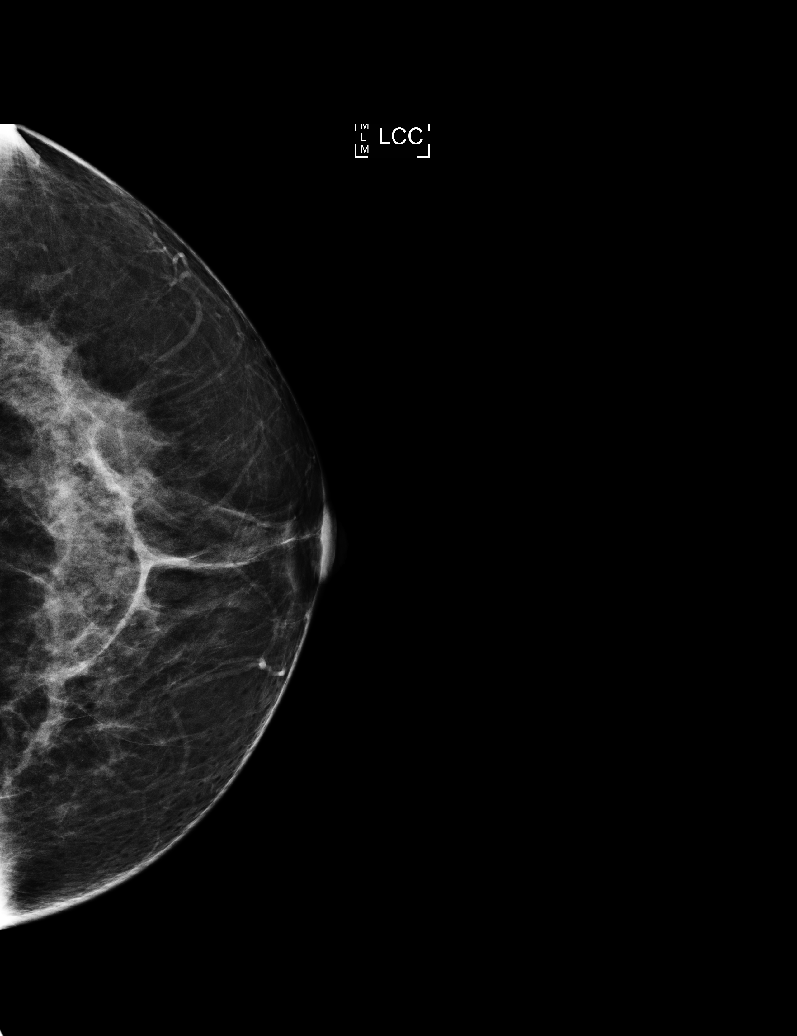

[L MLO]
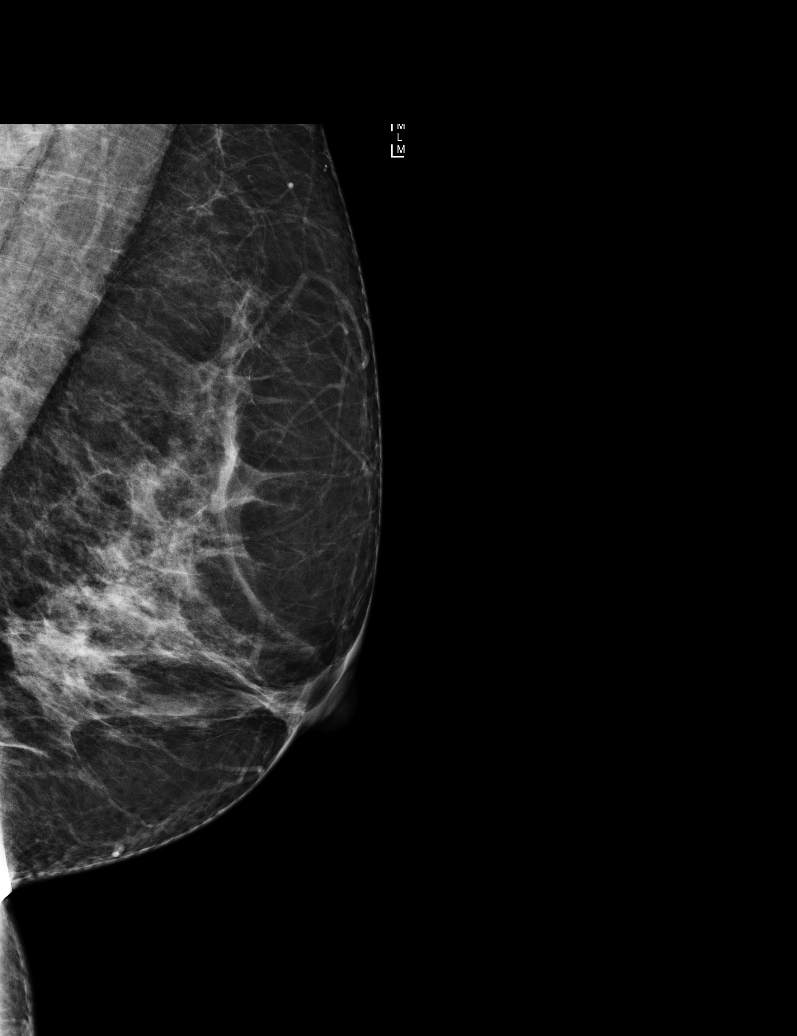

[R MLO]
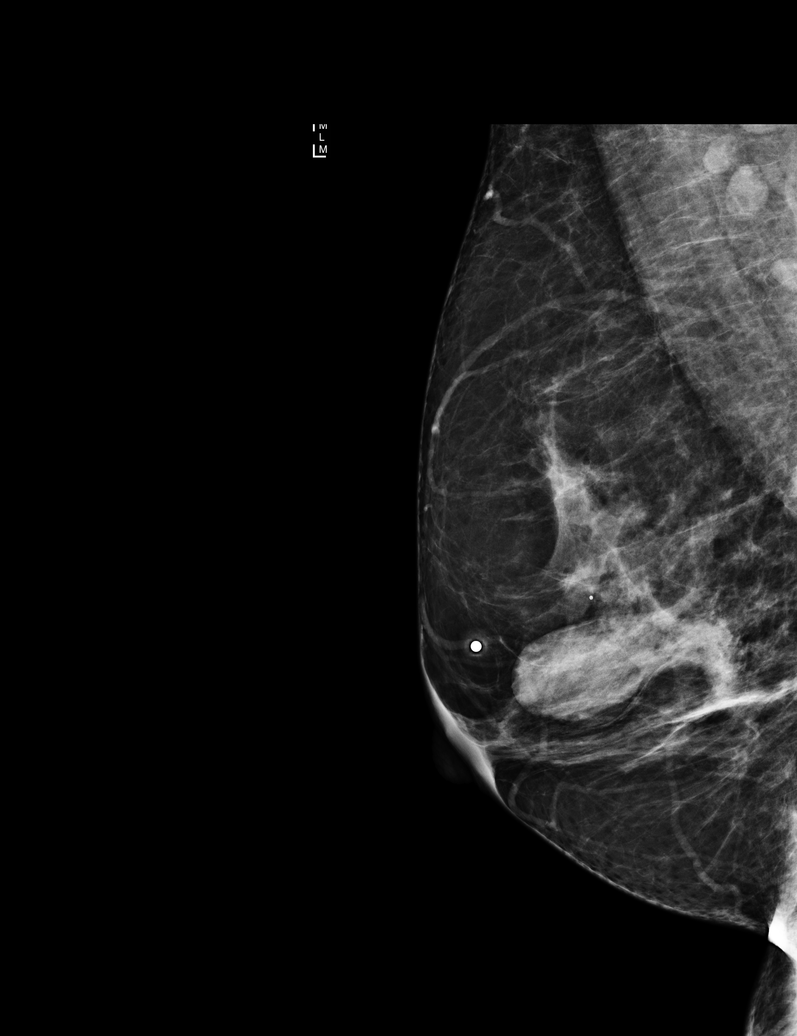

[R CC]
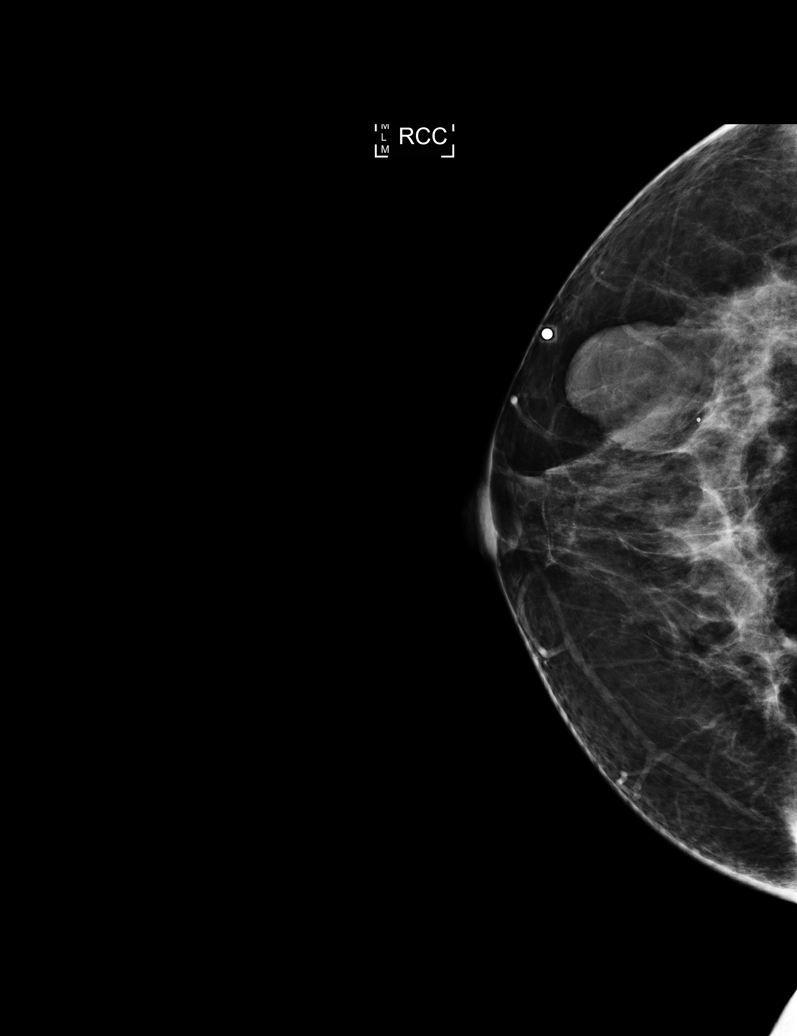

[R MLO tomo · tomo slice 42/83.0]
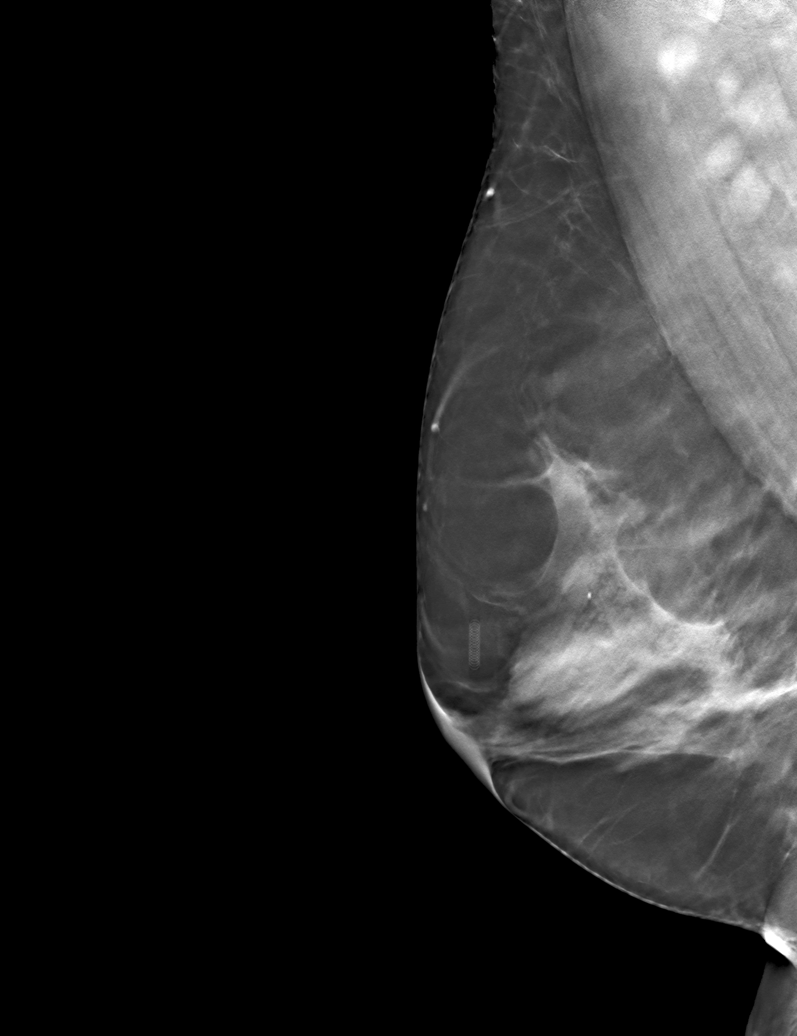

[6 of 30 positions shown; findings below may reference images not displayed]

ACR Breast Density Category c: The breast tissue is heterogeneously
dense, which may obscure small masses.
FINDINGS: There is an oval, circumscribed mass in the 9 o'clock position of
the right breast, corresponding to the mass felt by the patient,
marked with a metallic marker. No findings elsewhere in either
breast suspicious for malignancy.

Mammographic images were processed with CAD.

On physical exam, the patient has an approximately 1.5 cm rounded,
circumscribed, palpable mass in the 9 o'clock position of the right
breast, 1 cm from the nipple.

Targeted ultrasound is performed, showing a 2.5 cm cyst containing a
thin partial internal septation corresponding to the palpable mass
in the 9 o'clock position of the right breast.
IMPRESSION: Right breast cyst.  No evidence of malignancy.

RECOMMENDATION:
Bilateral screening mammogram in 1 year.

I have discussed the findings and recommendations with the patient.
Results were also provided in writing at the conclusion of the
visit. If applicable, a reminder letter will be sent to the patient
regarding the next appointment.

BI-RADS CATEGORY  2: Benign.

## 2015-12-16 HISTORY — PX: SHOULDER ARTHROSCOPY: SHX128

## 2015-12-30 ENCOUNTER — Encounter (HOSPITAL_COMMUNITY): Payer: Self-pay | Admitting: *Deleted

## 2015-12-30 ENCOUNTER — Emergency Department (HOSPITAL_COMMUNITY)
Admission: EM | Admit: 2015-12-30 | Discharge: 2015-12-30 | Disposition: A | Discharge status: Home / Self Care | Attending: Emergency Medicine | Admitting: Emergency Medicine

## 2015-12-30 ENCOUNTER — Emergency Department (HOSPITAL_COMMUNITY)

## 2015-12-30 DIAGNOSIS — R0789 Other chest pain: Secondary | ICD-10-CM

## 2015-12-30 DIAGNOSIS — M25511 Pain in right shoulder: Secondary | ICD-10-CM | POA: Insufficient documentation

## 2015-12-30 DIAGNOSIS — G43909 Migraine, unspecified, not intractable, without status migrainosus: Secondary | ICD-10-CM | POA: Insufficient documentation

## 2015-12-30 DIAGNOSIS — F329 Major depressive disorder, single episode, unspecified: Secondary | ICD-10-CM | POA: Insufficient documentation

## 2015-12-30 DIAGNOSIS — Z79899 Other long term (current) drug therapy: Secondary | ICD-10-CM | POA: Diagnosis not present

## 2015-12-30 DIAGNOSIS — R079 Chest pain, unspecified: Secondary | ICD-10-CM | POA: Diagnosis present

## 2015-12-30 DIAGNOSIS — F419 Anxiety disorder, unspecified: Secondary | ICD-10-CM | POA: Diagnosis not present

## 2015-12-30 LAB — BASIC METABOLIC PANEL
Anion gap: 8 (ref 5–15)
BUN: 11 mg/dL (ref 6–20)
CHLORIDE: 107 mmol/L (ref 101–111)
CO2: 25 mmol/L (ref 22–32)
Calcium: 9 mg/dL (ref 8.9–10.3)
Creatinine, Ser: 0.93 mg/dL (ref 0.44–1.00)
GFR calc Af Amer: >60 mL/min (ref >60)
GFR calc non Af Amer: >60 mL/min (ref >60)
GLUCOSE: 128 mg/dL — AB (ref 65–99)
Potassium: 3.9 mmol/L (ref 3.5–5.1)
Sodium: 140 mmol/L (ref 135–145)

## 2015-12-30 LAB — CBC
HEMATOCRIT: 36.9 % (ref 36.0–46.0)
HEMOGLOBIN: 12.4 g/dL (ref 12.0–15.0)
MCH: 30.7 pg (ref 26.0–34.0)
MCHC: 33.6 g/dL (ref 30.0–36.0)
MCV: 91.3 fL (ref 78.0–100.0)
Platelets: 273 10*3/uL (ref 150–400)
RBC: 4.04 MIL/uL (ref 3.87–5.11)
RDW: 13 % (ref 11.5–15.5)
WBC: 4.8 10*3/uL (ref 4.0–10.5)

## 2015-12-30 LAB — I-STAT TROPONIN, ED: TROPONIN I, POC: 0 ng/mL (ref 0.00–0.08)

## 2015-12-30 IMAGING — CR DG CHEST 2V
2 series · 2 of 2 positions shown · non-contrast
Comparison: None.

CLINICAL DATA: Right-sided chest pain radiating to back since
yesterday.

EXAM:
CHEST  2 VIEW

[chest pa]
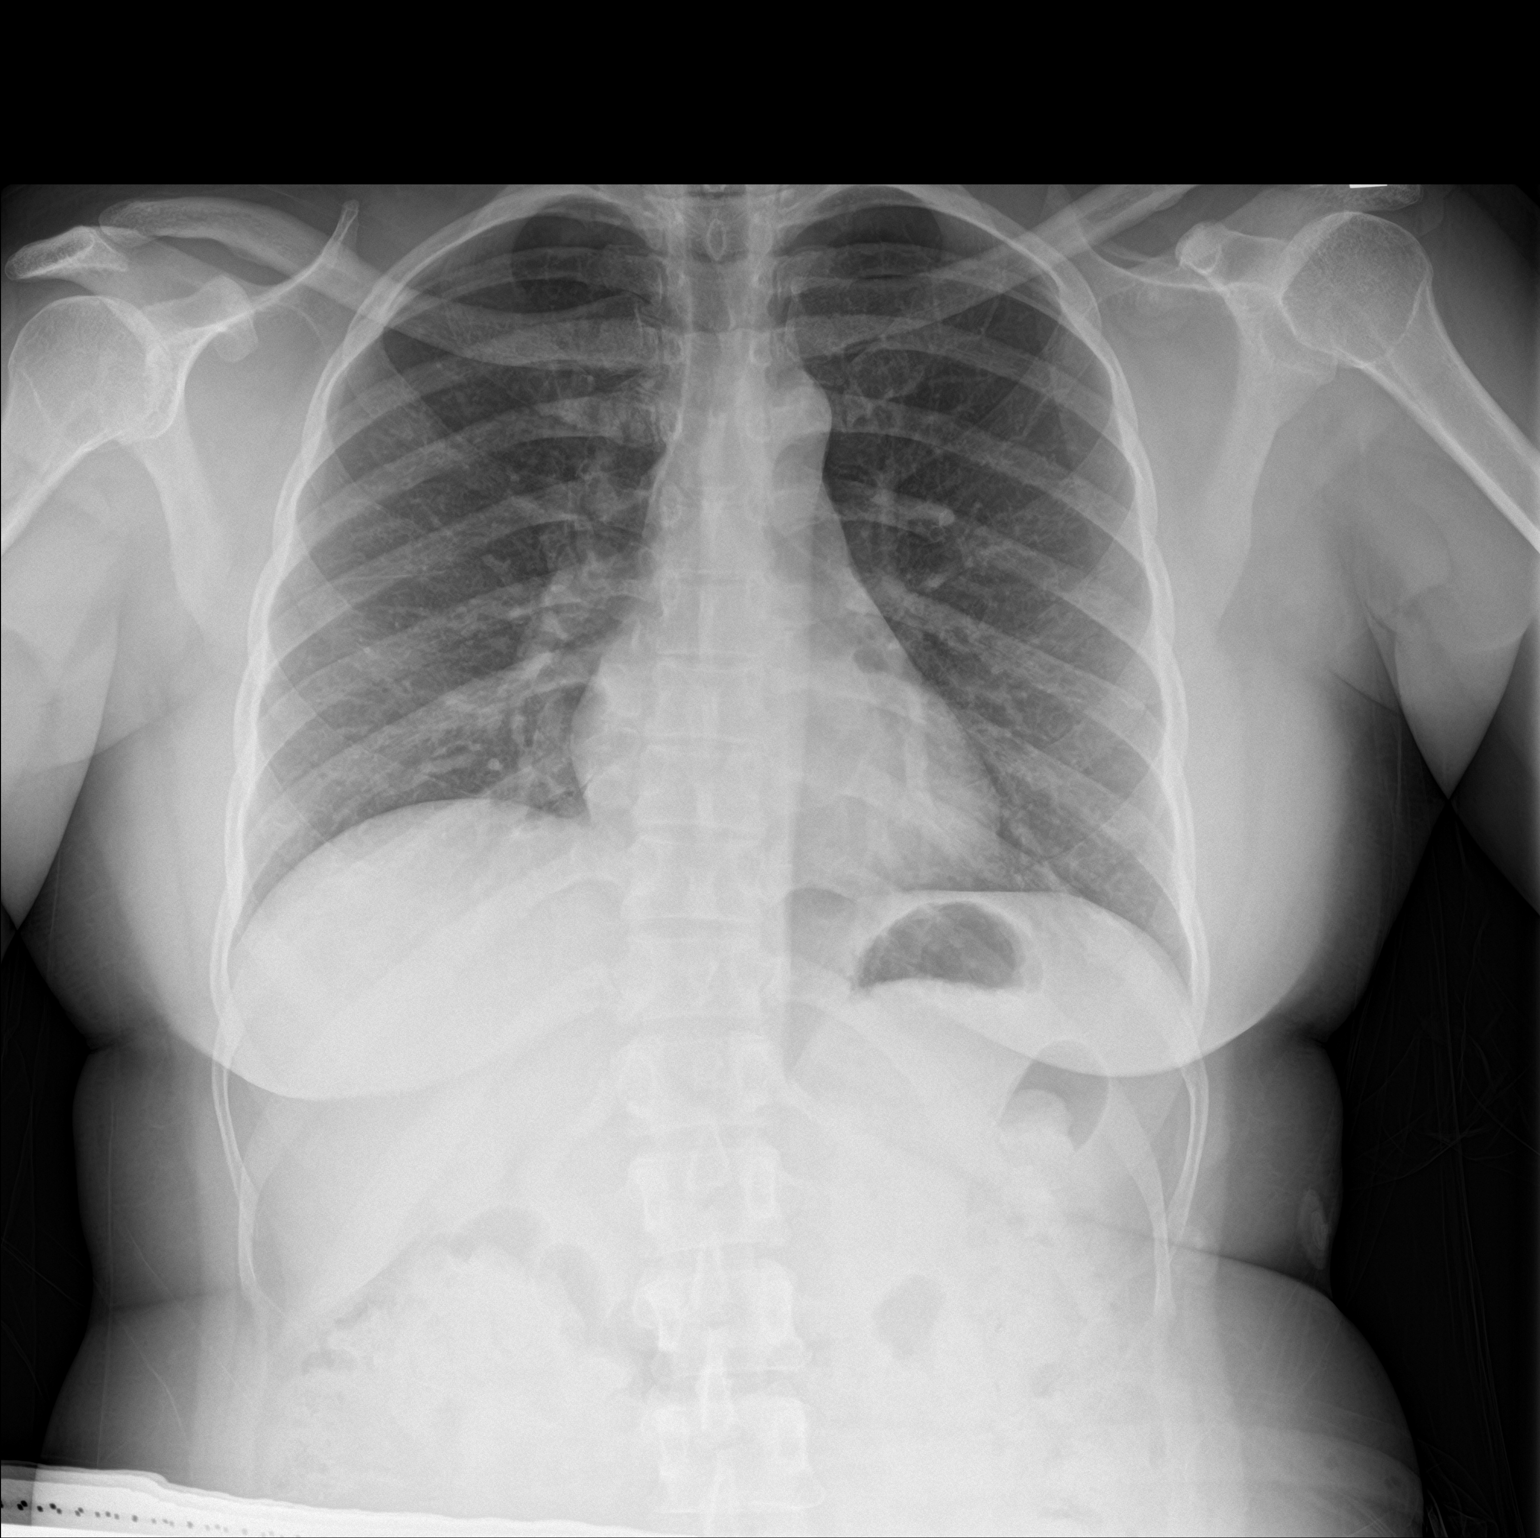

[chest lat]
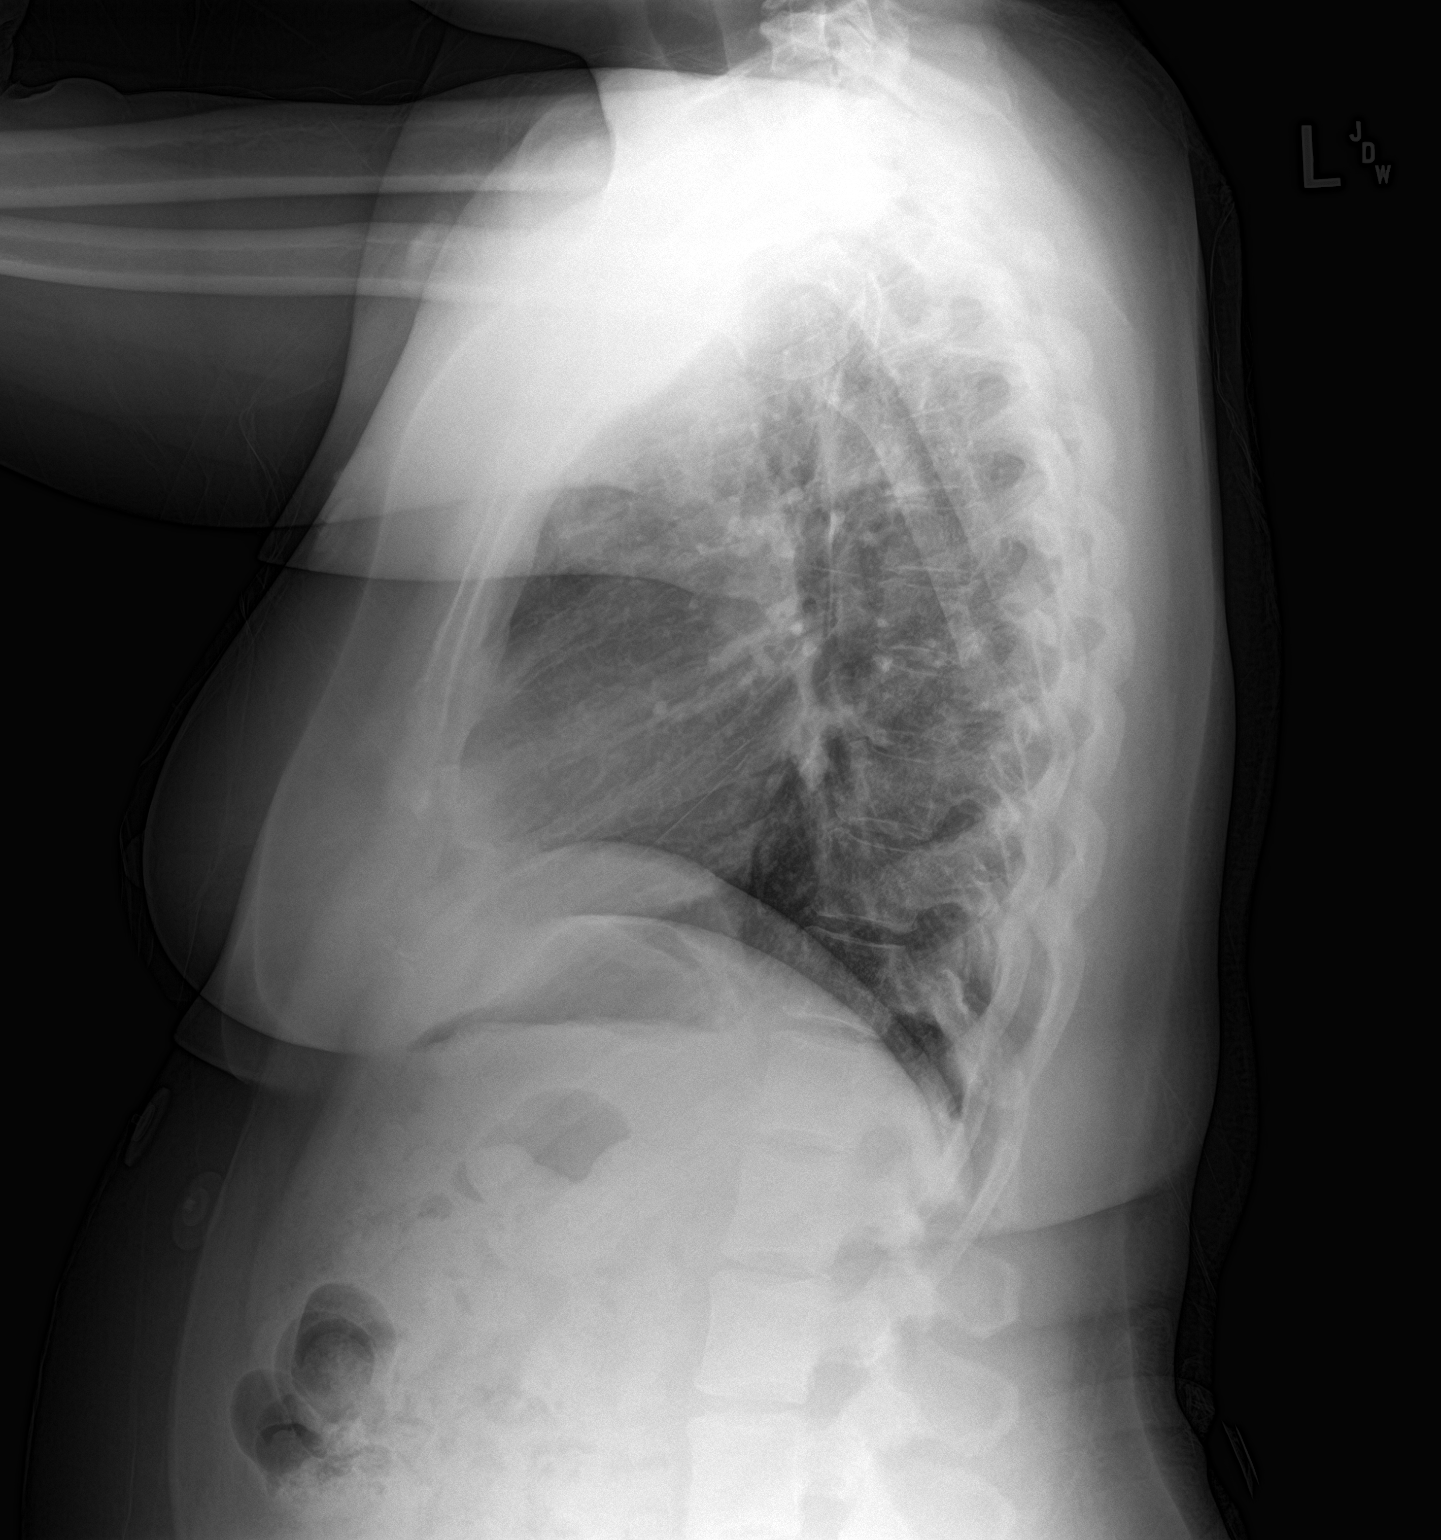

[2 of 2 positions shown; findings below may reference images not displayed]

FINDINGS: Lungs are clear. Cardiomediastinal silhouette is within normal.
Bones and soft tissues are normal.
IMPRESSION: No active cardiopulmonary disease.

## 2015-12-30 NOTE — ED Notes (Signed)
Pt c/o R shoulder pain onset x 3 mths, pt [facility], pt reports swelling on her posterior neck, no swelling upon assessment at this time, pt reports mid CP onset yesterday radiaiting under her R breast, denies SOB, denies n/v/d

## 2015-12-30 NOTE — ED Provider Notes (Signed)
CSN: [id number]     Arrival date & time 12/30/15  1304 History   First MD Initiated Contact with Patient 12/30/15 1515     Chief Complaint  Patient presents with  . Shoulder Pain  . Chest Pain     (Consider location/radiation/quality/duration/timing/severity/associated sxs/prior Treatment) HPI Complains of right shoulder pain onset November 2016. Pain is worse with moving her shoulder. She received cortisone injections twice for the same complaint and has an MRI scheduled tomorrow. She reports right-sided paracervical neck pain with swelling at the right posterior neck onset last week and her mother states there was a "goose egg" at her right posterior neck last night which has since resolved. She also reports right-sided anterior chest pain onset 1 week ago which is constant, not made worse or better by anything. No associate shortness of breath nausea or sweatiness pain is nonexertional. She's been treated with methocarbamol and hydrocodone for the chest pain, she reports methocarbamol causes partial relief hydrocodone does not provide any relief. No other associated symptoms. Last normal menstrual period December 2016  ePast Medical History  Diagnosis Date  . Ectopic pregnancy   . Migraine   . Osteoarthritis   . Anxiety   . PTSD (post-traumatic stress disorder)   . Depression   . Plantar fasciitis    Past Surgical History  Procedure Laterality Date  . Ectopic pregnancy surgery  10/2000  . Ectopic pregnancy surgery  01/2004   Family History  Problem Relation Age of Onset  . Hypertension Mother   . Seizures Mother    Social History  Substance Use Topics  . Smoking status: Never Smoker   . Smokeless tobacco: Never Used  . Alcohol Use: No   OB History    No data available     Review of Systems  Constitutional: Negative.   HENT: Negative.   Respiratory: Negative.   Cardiovascular: Positive for chest pain.  Gastrointestinal: Negative.   Musculoskeletal: Positive for  arthralgias.       Right shoulder pain  Skin: Negative.   Neurological: Negative.   Psychiatric/Behavioral: Negative.   All other systems reviewed and are negative.     Allergies  Review of patient's allergies indicates no known allergies.  Home Medications   Prior to Admission medications   Medication Sig Start Date End Date Taking? Authorizing Provider  buPROPion (WELLBUTRIN) 100 MG tablet Take 100 mg by mouth daily.    Historical Provider, MD  escitalopram (LEXAPRO) 10 MG tablet Take 10 mg by mouth daily.    Historical Provider, MD  ondansetron (ZOFRAN) 4 MG tablet Take 1 tablet (4 mg total) by mouth every 6 (six) hours. 03/09/15   Hayden Rasmussenavid Mabe, NP  traZODone (DESYREL) 50 MG tablet Take 50 mg by mouth at bedtime.    Historical Provider, MD   BP 114/72 mmHg  Pulse 85  Temp(Src) 98.8 F (37.1 C) (Oral)  Resp 18  SpO2 97%  LMP 12/14/2015 Physical Exam  Constitutional: She appears well-developed and well-nourished.  HENT:  Head: Normocephalic and atraumatic.  Eyes: Conjunctivae are normal. Pupils are equal, round, and reactive to light.  Neck: Neck supple. No JVD present. No tracheal deviation present. No thyromegaly present.  No swelling no redness for range of motion no bruit  Cardiovascular: Normal rate, regular rhythm and intact distal pulses.   No murmur heard. Radial pulses 2+ bilaterally  Pulmonary/Chest: Effort normal and breath sounds normal. She exhibits tenderness.  Right anterior chest is tender, reproducing pain exactly. Pain is also reproducible by  forcible abduction of right shoulder.  Abdominal: Soft. Bowel sounds are normal. She exhibits no distension. There is no tenderness.  Musculoskeletal: Normal range of motion. She exhibits no edema or tenderness.  All 4 extremities without redness swelling or tenderness neurovascular intact. She has pain at right shoulder on forcible abduction  Neurological: She is alert. Coordination normal.  Skin: Skin is warm and  dry. No rash noted.  Psychiatric: She has a normal mood and affect.  Nursing note and vitals reviewed.   ED Course  Procedures (including critical care time) Labs Review Labs Reviewed  BASIC METABOLIC PANEL - Abnormal; Notable for the following:    Glucose, Bld 128 (*)    All other components within normal limits  CBC  I-STAT TROPOININ, ED    Imaging Review Dg Chest 2 View  12/30/2015  CLINICAL DATA:  Right-sided chest pain radiating to back since yesterday. EXAM: CHEST  2 VIEW COMPARISON:  None. FINDINGS: Lungs are clear. Cardiomediastinal silhouette is within normal. Bones and soft tissues are normal. IMPRESSION: No active cardiopulmonary disease. Electronically Signed   By: Elberta Fortis M.D.   On: 12/30/2015 14:17   I have personally reviewed and evaluated these images and lab results as part of my medical decision-making.   EKG Interpretation None      chest x-ray viewed by me  Results for orders placed or performed during the hospital encounter of 12/30/15  Basic metabolic panel  Result Value Ref Range   Sodium 140 135 - 145 mmol/L   Potassium 3.9 3.5 - 5.1 mmol/L   Chloride 107 101 - 111 mmol/L   CO2 25 22 - 32 mmol/L   Glucose, Bld 128 (H) 65 - 99 mg/dL   BUN 11 6 - 20 mg/dL   Creatinine, Ser 1.61 0.44 - 1.00 mg/dL   Calcium 9.0 8.9 - 09.6 mg/dL   GFR calc non Af Amer >60 >60 mL/min   GFR calc Af Amer >60 >60 mL/min   Anion gap 8 5 - 15  CBC  Result Value Ref Range   WBC 4.8 4.0 - 10.5 K/uL   RBC 4.04 3.87 - 5.11 MIL/uL   Hemoglobin 12.4 12.0 - 15.0 g/dL   HCT 04.5 40.9 - 81.1 %   MCV 91.3 78.0 - 100.0 fL   MCH 30.7 26.0 - 34.0 pg   MCHC 33.6 30.0 - 36.0 g/dL   RDW 91.4 78.2 - 95.6 %   Platelets 273 150 - 400 K/uL  I-stat troponin, ED (not at [facility], [facility])  Result Value Ref Range   Troponin i, poc 0.00 0.00 - 0.08 ng/mL   Comment 3           Dg Chest 2 View  12/30/2015  CLINICAL DATA:  Right-sided chest pain radiating to back since yesterday. EXAM: CHEST   2 VIEW COMPARISON:  None. FINDINGS: Lungs are clear. Cardiomediastinal silhouette is within normal. Bones and soft tissues are normal. IMPRESSION: No active cardiopulmonary disease. Electronically Signed   By: Elberta Fortis M.D.   On: 12/30/2015 14:17    MDM   exam is consistent with musculoskeletal pain. I don't see any swelling in her neck. Heart score equals 0. Plan she can take methocarbamol or Norco as directed. She is to get an MRI study tomorrow ordered by her orthopedic physician, at the office. She can follow-up with her primary care physician I notified her as to her blood sugar 128, mildly elevated. She reports that she's had a hemoglobin A1c in  recent months which was normal. She's been advised to follow-up with her primary care physician [facility] Diagnoses #1 atypical chest pain  Diagnosis #2 right shoulder pain  #3 neck pain  #4 hyperglycemia  Final diagnoses:  None        Doug Sou, MD 12/30/15 1537

## 2015-12-30 NOTE — Discharge Instructions (Signed)
Nonspecific Chest Pain Blood sugar today was 128. Contact your primary care physician tomorrow to let him know. Also call to tell him that you were seen here today in the emergency department. He may want to see you in the office. Your scheduled appointment for your MRI scan tomorrow. It is often hard to find the cause of chest pain. There is always a chance that your pain could be related to something serious, such as a heart attack or a blood clot in your lungs. Chest pain can also be caused by conditions that are not life-threatening. If you have chest pain, it is very important to follow up with your doctor.  HOME CARE  If you were prescribed an antibiotic medicine, finish it all even if you start to feel better.  Avoid any activities that cause chest pain.  Do not use any tobacco products, including cigarettes, chewing tobacco, or electronic cigarettes. If you need help quitting, ask your doctor.  Do not drink alcohol.  Take medicines only as told by your doctor.  Keep all follow-up visits as told by your doctor. This is important. This includes any further testing if your chest pain does not go away.  Your doctor may tell you to keep your head raised (elevated) while you sleep.  Make lifestyle changes as told by your doctor. These may include:  Getting regular exercise. Ask your doctor to suggest some activities that are safe for you.  Eating a heart-healthy diet. Your doctor or a diet specialist (dietitian) can help you to learn healthy eating options.  Maintaining a healthy weight.  Managing diabetes, if necessary.  Reducing stress. GET HELP IF:  Your chest pain does not go away, even after treatment.  You have a rash with blisters on your chest.  You have a fever. GET HELP RIGHT AWAY IF:  Your chest pain is worse.  You have an increasing cough, or you cough up blood.  You have severe belly (abdominal) pain.  You feel extremely weak.  You pass out  (faint).  You have chills.  You have sudden, unexplained chest discomfort.  You have sudden, unexplained discomfort in your arms, back, neck, or jaw.  You have shortness of breath at any time.  You suddenly start to sweat, or your skin gets clammy.  You feel nauseous.  You vomit.  You suddenly feel light-headed or dizzy.  Your heart begins to beat quickly, or it feels like it is skipping beats. These symptoms may be an emergency. Do not wait to see if the symptoms will go away. Get medical help right away. Call your local emergency services (911 in the [place] Do not drive yourself to the hospital.   This information is not intended to replace advice given to you by your health care provider. Make sure you discuss any questions you have with your health care provider.   Document Released: 05/19/2008 Document Revised: 12/22/2014 Document Reviewed: 07/07/2014 Elsevier Interactive Patient Education [employer]2016 [employer]

## 2016-03-04 ENCOUNTER — Emergency Department (HOSPITAL_COMMUNITY): Payer: No Typology Code available for payment source

## 2016-03-04 ENCOUNTER — Emergency Department (HOSPITAL_COMMUNITY)
Admission: EM | Admit: 2016-03-04 | Discharge: 2016-03-05 | Disposition: A | Discharge status: Other Acute Inpatient Hospital | Payer: No Typology Code available for payment source | Attending: Emergency Medicine | Admitting: Emergency Medicine

## 2016-03-04 ENCOUNTER — Encounter (HOSPITAL_COMMUNITY): Payer: Self-pay

## 2016-03-04 DIAGNOSIS — G43909 Migraine, unspecified, not intractable, without status migrainosus: Secondary | ICD-10-CM | POA: Insufficient documentation

## 2016-03-04 DIAGNOSIS — Y92410 Unspecified street and highway as the place of occurrence of the external cause: Secondary | ICD-10-CM | POA: Insufficient documentation

## 2016-03-04 DIAGNOSIS — Z3202 Encounter for pregnancy test, result negative: Secondary | ICD-10-CM | POA: Diagnosis not present

## 2016-03-04 DIAGNOSIS — Y9389 Activity, other specified: Secondary | ICD-10-CM | POA: Insufficient documentation

## 2016-03-04 DIAGNOSIS — Z79899 Other long term (current) drug therapy: Secondary | ICD-10-CM | POA: Insufficient documentation

## 2016-03-04 DIAGNOSIS — F329 Major depressive disorder, single episode, unspecified: Secondary | ICD-10-CM | POA: Insufficient documentation

## 2016-03-04 DIAGNOSIS — S0990XA Unspecified injury of head, initial encounter: Secondary | ICD-10-CM | POA: Insufficient documentation

## 2016-03-04 DIAGNOSIS — S29001A Unspecified injury of muscle and tendon of front wall of thorax, initial encounter: Secondary | ICD-10-CM | POA: Diagnosis not present

## 2016-03-04 DIAGNOSIS — R45851 Suicidal ideations: Secondary | ICD-10-CM | POA: Diagnosis not present

## 2016-03-04 DIAGNOSIS — S199XXA Unspecified injury of neck, initial encounter: Secondary | ICD-10-CM | POA: Diagnosis present

## 2016-03-04 DIAGNOSIS — F419 Anxiety disorder, unspecified: Secondary | ICD-10-CM | POA: Diagnosis not present

## 2016-03-04 DIAGNOSIS — Y998 Other external cause status: Secondary | ICD-10-CM | POA: Diagnosis not present

## 2016-03-04 LAB — I-STAT BETA HCG BLOOD, ED (MC, WL, AP ONLY): I-stat hCG, quantitative: <5 m[IU]/mL (ref <5)

## 2016-03-04 LAB — COMPREHENSIVE METABOLIC PANEL
ALT: 16 U/L (ref 14–54)
AST: 18 U/L (ref 15–41)
Albumin: 3.7 g/dL (ref 3.5–5.0)
Alkaline Phosphatase: 57 U/L (ref 38–126)
Anion gap: 12 (ref 5–15)
BUN: 10 mg/dL (ref 6–20)
CHLORIDE: 105 mmol/L (ref 101–111)
CO2: 21 mmol/L — ABNORMAL LOW (ref 22–32)
Calcium: 9.4 mg/dL (ref 8.9–10.3)
Creatinine, Ser: 1.01 mg/dL — ABNORMAL HIGH (ref 0.44–1.00)
Glucose, Bld: 102 mg/dL — ABNORMAL HIGH (ref 65–99)
POTASSIUM: 4.4 mmol/L (ref 3.5–5.1)
Sodium: 138 mmol/L (ref 135–145)
Total Bilirubin: 0.5 mg/dL (ref 0.3–1.2)
Total Protein: 7.2 g/dL (ref 6.5–8.1)

## 2016-03-04 LAB — LIPASE, BLOOD: LIPASE: 21 U/L (ref 11–51)

## 2016-03-04 LAB — CBC
HEMATOCRIT: 37.8 % (ref 36.0–46.0)
Hemoglobin: 12.3 g/dL (ref 12.0–15.0)
MCH: 29.4 pg (ref 26.0–34.0)
MCHC: 32.5 g/dL (ref 30.0–36.0)
MCV: 90.4 fL (ref 78.0–100.0)
PLATELETS: 289 10*3/uL (ref 150–400)
RBC: 4.18 MIL/uL (ref 3.87–5.11)
RDW: 12.8 % (ref 11.5–15.5)
WBC: 9.7 10*3/uL (ref 4.0–10.5)

## 2016-03-04 LAB — ETHANOL
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
## 2016-03-04 MED ORDER — FENTANYL CITRATE (PF) 100 MCG/2ML IJ SOLN
50.0000 ug | Freq: Once | INTRAMUSCULAR | Status: AC
  Administered 2016-03-04: 50 ug via INTRAVENOUS
  Filled 2016-03-04: qty 2

## 2016-03-04 MED ORDER — IOHEXOL 300 MG/ML  SOLN
75.0000 mL | Freq: Once | INTRAMUSCULAR | Status: AC | PRN
  Administered 2016-03-04: 75 mL via INTRAVENOUS

## 2016-03-04 NOTE — ED Notes (Signed)
Ortho tech notified of splint need. 
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
## 2016-03-04 NOTE — ED Notes (Signed)
Ortho tech at the bedside.  

## 2016-03-04 NOTE — ED Notes (Signed)
Patient transported to X-ray 

## 2016-03-04 NOTE — ED Notes (Signed)
Staffing called for sitter for SI case, CN notified.

## 2016-03-04 NOTE — Progress Notes (Signed)
Orthopedic Tech Progress Note Patient Details:  Marcelyn Bruinsurora F Rodenbaugh [birth year] [id number]  Ortho Devices Type of Ortho Device: Ace wrap, Post (short leg) splint Ortho Device/Splint Location: RLE Ortho Device/Splint Interventions: Ordered, Application   Jennye MoccasinHughes, Ryana Montecalvo Craig 03/04/2016, 8:55 PM

## 2016-03-05 ENCOUNTER — Inpatient Hospital Stay (HOSPITAL_COMMUNITY)
Admission: AD | Admit: 2016-03-05 | Discharge: 2016-03-10 | DRG: 885 | Disposition: A | Discharge status: Home / Self Care | Source: Intra-hospital | Attending: Psychiatry | Admitting: Psychiatry

## 2016-03-05 ENCOUNTER — Encounter (HOSPITAL_COMMUNITY): Payer: Self-pay

## 2016-03-05 DIAGNOSIS — F331 Major depressive disorder, recurrent, moderate: Principal | ICD-10-CM | POA: Diagnosis present

## 2016-03-05 DIAGNOSIS — F3342 Major depressive disorder, recurrent, in full remission: Secondary | ICD-10-CM | POA: Diagnosis present

## 2016-03-05 DIAGNOSIS — F329 Major depressive disorder, single episode, unspecified: Secondary | ICD-10-CM | POA: Diagnosis present

## 2016-03-05 DIAGNOSIS — S199XXA Unspecified injury of neck, initial encounter: Secondary | ICD-10-CM | POA: Diagnosis not present

## 2016-03-05 LAB — RAPID URINE DRUG SCREEN, HOSP PERFORMED
AMPHETAMINES: NOT DETECTED
BARBITURATES: NOT DETECTED
Benzodiazepines: NOT DETECTED
Cocaine: NOT DETECTED
Opiates: NOT DETECTED
Tetrahydrocannabinol: NOT DETECTED

## 2016-03-05 MED ORDER — ACETAMINOPHEN 325 MG PO TABS
650.0000 mg | ORAL_TABLET | Freq: Four times a day (QID) | ORAL | Status: DC | PRN
  Administered 2016-03-06: 650 mg via ORAL

## 2016-03-05 MED ORDER — IBUPROFEN 600 MG PO TABS
600.0000 mg | ORAL_TABLET | Freq: Four times a day (QID) | ORAL | Status: DC | PRN
  Administered 2016-03-05 – 2016-03-09 (×6): 600 mg via ORAL
  Filled 2016-03-05 (×6): qty 1

## 2016-03-05 MED ORDER — TRAZODONE HCL 50 MG PO TABS
50.0000 mg | ORAL_TABLET | Freq: Every day | ORAL | Status: DC
  Administered 2016-03-05: 50 mg via ORAL
  Filled 2016-03-05: qty 1

## 2016-03-05 MED ORDER — ALUM & MAG HYDROXIDE-SIMETH 200-200-20 MG/5ML PO SUSP: 30.0000 mL | ORAL | Status: DC | PRN

## 2016-03-05 MED ORDER — MAGNESIUM HYDROXIDE 400 MG/5ML PO SUSP: 30.0000 mL | Freq: Every day | ORAL | Status: DC | PRN

## 2016-03-05 MED ORDER — BUPROPION HCL ER (XL) 150 MG PO TB24
300.0000 mg | ORAL_TABLET | Freq: Every day | ORAL | Status: DC
  Administered 2016-03-05: 300 mg via ORAL
  Filled 2016-03-05 (×2): qty 2

## 2016-03-05 MED ORDER — ESCITALOPRAM OXALATE 10 MG PO TABS
20.0000 mg | ORAL_TABLET | Freq: Every day | ORAL | Status: DC
Start: 2016-03-05 — End: 2016-03-05
  Administered 2016-03-05: 20 mg via ORAL
  Filled 2016-03-05: qty 2

## 2016-03-05 MED ORDER — HYDROCODONE-ACETAMINOPHEN 5-325 MG PO TABS
1.0000 | ORAL_TABLET | Freq: Four times a day (QID) | ORAL | Status: DC | PRN
  Administered 2016-03-05 (×2): 1 via ORAL
  Filled 2016-03-05 (×2): qty 1

## 2016-03-05 MED ORDER — PRAZOSIN HCL 1 MG PO CAPS
1.0000 mg | ORAL_CAPSULE | Freq: Every day | ORAL | Status: DC
  Administered 2016-03-05: 1 mg via ORAL
  Filled 2016-03-05 (×4): qty 1

## 2016-03-05 MED ORDER — TRAZODONE HCL 50 MG PO TABS
50.0000 mg | ORAL_TABLET | Freq: Every evening | ORAL | Status: DC | PRN
  Administered 2016-03-05 – 2016-03-09 (×5): 50 mg via ORAL
  Filled 2016-03-05 (×15): qty 1

## 2016-03-05 NOTE — Discharge Instructions (Signed)
Please call orthopedic surgery at the phone number listed in your discharge instructions. Please follow up with them in the next couple of days regarding your right ankle fracture. He should wear your splint at all times until follow-up. Please use crutches and do not place any weight on your right ankle.
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
## 2016-03-05 NOTE — Progress Notes (Signed)
Patient ID: Jasmine [name], female   DOB: [birth year], 40 y.o.   MRN: [id number] D: Client in bed, reports pain, right leg assessed, splint intact, leg elevated. Client able to move toes freely, but experiencing some discomfort as she does so. Client reports pain at "10" of 10. Client reports feeling "guility" about attempted suicide. A; Writer provided emotional support, medications reviewed, administered Ibuprofen 600 mg for pain. Encouraged client to report any concerns to staff. Staff will monitor q715min for safety. R:Client is safe on the unit.

## 2016-03-05 NOTE — ED Notes (Signed)
Pt ambulated in hall with crutches without any problems.

## 2016-03-05 NOTE — ED Notes (Signed)
Patient belongings in one bag placed in bin 21.

## 2016-03-05 NOTE — ED Notes (Signed)
Patient given food tray

## 2016-03-05 NOTE — ED Provider Notes (Signed)
Filed Vitals:   03/05/16 1115 03/05/16 1130  BP: 110/72 106/64  Pulse: 84 79  Temp:    Resp:     Pt awaiting placment.  HAS A LIVER LESION ON CT THAT NEEDS A FOLLOW UP MRI AS AN OUTPATIENT  Rolan BuccoMelanie Mackenna Kamer, MD 03/05/16 1342
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
## 2016-03-05 NOTE — ED Notes (Signed)
TTS completed. 

## 2016-03-05 NOTE — ED Notes (Signed)
Mother's phone number [phone]
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
## 2016-03-06 ENCOUNTER — Encounter (HOSPITAL_COMMUNITY): Payer: Self-pay | Admitting: Psychiatry

## 2016-03-06 DIAGNOSIS — F331 Major depressive disorder, recurrent, moderate: Principal | ICD-10-CM

## 2016-03-06 MED ORDER — BACITRACIN ZINC 500 UNIT/GM EX OINT
TOPICAL_OINTMENT | Freq: Two times a day (BID) | CUTANEOUS | Status: DC
  Administered 2016-03-06 – 2016-03-10 (×7): via TOPICAL
  Filled 2016-03-06: qty 28.35

## 2016-03-06 MED ORDER — LORAZEPAM 0.5 MG PO TABS: 0.5000 mg | ORAL_TABLET | Freq: Four times a day (QID) | ORAL | Status: DC | PRN

## 2016-03-06 MED ORDER — PRAZOSIN HCL 1 MG PO CAPS
1.0000 mg | ORAL_CAPSULE | Freq: Every day | ORAL | Status: DC
  Administered 2016-03-06 – 2016-03-09 (×4): 1 mg via ORAL
  Filled 2016-03-06 (×6): qty 1

## 2016-03-06 MED ORDER — ESCITALOPRAM OXALATE 20 MG PO TABS
20.0000 mg | ORAL_TABLET | Freq: Every day | ORAL | Status: DC
  Administered 2016-03-06 – 2016-03-10 (×5): 20 mg via ORAL
  Filled 2016-03-06 (×3): qty 1
  Filled 2016-03-06: qty 2
  Filled 2016-03-06 (×4): qty 1

## 2016-03-06 MED ORDER — ACETAMINOPHEN 325 MG PO TABS
650.0000 mg | ORAL_TABLET | Freq: Four times a day (QID) | ORAL | Status: DC | PRN
  Administered 2016-03-06: 650 mg via ORAL
  Filled 2016-03-06: qty 2

## 2016-03-06 NOTE — Tx Team (Signed)
Interdisciplinary Treatment Plan Update (Adult) Date: 03/06/2016   Date: 03/06/2016 1:42 PM  Progress in Treatment:  Attending groups: Yes  Participating in groups: Yes  Taking medication as prescribed: Yes  Tolerating medication: Yes  Family/Significant othe contact made: No, CSW assessing for appropriate contact Patient understands diagnosis: Continuing to assess Discussing patient identified problems/goals with staff: Yes  Medical problems stabilized or resolved: Yes  Denies suicidal/homicidal ideation: Yes Patient has not harmed self or Others: Yes   New problem(s) identified: None identified at this time.   Discharge Plan or Barriers: CSW will assess for appropriate discharge plan and relevant barriers.   Additional comments:  Patient and CSW reviewed pt's identified goals and treatment plan. Patient verbalized understanding and agreed to treatment plan. CSW reviewed [facility] "Discharge Process and Patient Involvement" Form. Pt verbalized understanding of information provided and signed form.   Reason for Continuation of Hospitalization:  Anxiety Depression Medication stabilization Suicidal ideation  Estimated length of stay: 3-5 days  Review of initial/current patient goals per problem list:   1.  Goal(s): Patient will participate in aftercare plan  Met:  No  Target date: 3-5 days from date of admission   As evidenced by: Patient will participate within aftercare plan AEB aftercare provider and housing plan at discharge being identified.   03/06/16: CSW will assess for appropriate discharge plan and relevant barriers.   2.  Goal (s): Patient will exhibit decreased depressive symptoms and suicidal ideations.  Met:  No  Target date: 3-5 days from date of admission   As evidenced by: Patient will utilize self rating of depression at 3 or below and demonstrate decreased signs of depression or be deemed stable for discharge by MD.  03/06/16: Pt rates depression at 9/10;  denies SI  3.  Goal(s): Patient will demonstrate decreased signs and symptoms of anxiety.  Met:  No  Target date: 3-5 days from date of admission   As evidenced by: Patient will utilize self rating of anxiety at 3 or below and demonstrated decreased signs of anxiety, or be deemed stable for discharge by MD  03/06/16: Pt rates depression at 8/10  Attendees:  Patient:    Family:    Physician: Dr. Parke Poisson, MD  03/06/2016 1:42 PM  Nursing: Lars Pinks, RN Case manager  03/06/2016 1:42 PM  Clinical Social Worker Peri Maris, [place] 03/06/2016 1:42 PM  Other: Tilden Fossa, LCSWA 03/06/2016 1:42 PM  Clinical: Mayra Neer, RN; Grayland Ormond, RN 03/06/2016 1:42 PM  Other: , RN Charge Nurse 03/06/2016 1:42 PM  Other: Hilda Lias, [place], [place] Work [phone]
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
## 2016-03-06 NOTE — [facility] Group Notes (Signed)
The focus of this group is to educate the patient on the purpose and policies of crisis stabilization and provide a format to answer questions about their admission.  The group details unit policies and expectations of patients while admitted.  Patient attended 0900 nurse education orientation group this morning.  Patient actively participated and had appropriate affect.  Patient is alert.  Patient had appropriate insight and appropriate engagement.  Today patient will work on 3 goals for discharge.  

## 2016-03-06 NOTE — Plan of Care (Signed)
Problem: Consults Goal: Depression Patient Education See Patient Education Module for education specifics.  Outcome: Progressing Nurse discussed depression/coping skills with patient.        
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
## 2016-03-06 NOTE — [facility] Group Notes (Signed)
[facility] 03/06/2016 1:15pm  Type of Therapy: Mental Health Association Presentation  Participation Level: Active  Participation Quality: Attentive  Affect: Appropriate  Cognitive: Oriented  Insight: Developing/Improving  Engagement in Therapy: Engaged  Modes of Intervention: Discussion, Education and Socialization  Summary of Progress/Problems: Mental Health Association (MHA) Speaker came to talk about his personal journey with substance abuse and addiction. The pt processed ways by which to relate to the speaker. MHA speaker provided handouts and educational information pertaining to groups and services offered by [facility]. Pt was engaged in speaker's presentation and was receptive to resources provided.    Chad CordialLauren Carter, LCSWA 03/06/2016 1:26 PM
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
## 2016-03-06 NOTE — Progress Notes (Signed)
Patient stated she did not need ativan for anxiety at this time.  Stated ibuprofen and tylenol alternating are helping her leg/foot/ankle pain at this time.  Respirations even and unlabored.  No signs/symptoms of pain/distress noted on patient's face/body movements.  Safety maintained with 15 minute checks.

## 2016-03-06 NOTE — Progress Notes (Signed)
Adult Psychoeducational Group Note  Date:  03/06/2016 Time:  9:26 PM  Group Topic/Focus:  Wrap-Up Group:   The focus of this group is to help patients review their daily goal of treatment and discuss progress on daily workbooks.  Participation Level:  Active  Participation Quality:  Appropriate  Affect:  Appropriate  Cognitive:  Alert  Insight: Appropriate  Engagement in Group:  Engaged  Modes of Intervention:  Discussion  Additional Comments:  Patient stated having a good day. Patient goal for today was to relax.   Jasmine [name] L Mahkayla Preece 03/06/2016, 9:26 PM

## 2016-03-07 LAB — TSH: TSH: 2.104 u[IU]/mL (ref 0.350–4.500)

## 2016-03-07 NOTE — Progress Notes (Addendum)
[facility] MD Progress Note  03/07/2016 4:14 PM Jasmine [name]  MRN:  [id number] Subjective:  Patient reports she is feeling better, less depressed . Denies medication side effects. Objective : I have discussed case with treatment team and have met with patient. Patient remains depressed, but reports feeling better than she had prior to admission. Although still constricted, she  does present with a fuller range of affect, and smiles at times appropriately. Denies medication side effects. Visible on unit, going to groups, no disruptive or agitated behaviors on unit. She reports concern that she feels her R  toes have been tingling today ( patient had a non displaced fracture of fibula and has cast ). With RN present I examined patient - toes are visible as cast does not cover them- she has preserved sensation, motion and adequate capillary filling on toes . No change in temperature or discoloration noted.  Patient denies any lingering or ongoing suicidal ideations, contracts for safety on unit. States " I am feeling better". Reports she slept better last night, denies nightmares - on Minipress, which she has tolerated well thus far .  Principal Problem: MDD (major depressive disorder), recurrent episode, moderate ([place]) Diagnosis:   Patient Active Problem List   Diagnosis Date Noted  . MDD (major depressive disorder), recurrent episode, moderate ([place]) [F33.1] 03/05/2016  . MDD (major depressive disorder) ([place]) [F32.9] 03/28/2014   Total Time spent with patient: 25 minutes     Past Medical History:  Past Medical History  Diagnosis Date  . Ectopic pregnancy   . Migraine   . Osteoarthritis   . Anxiety   . PTSD (post-traumatic stress disorder)   . Depression   . Plantar fasciitis     Past Surgical History  Procedure Laterality Date  . Ectopic pregnancy surgery  10/2000  . Ectopic pregnancy surgery  01/2004   Family History:  Family History  Problem Relation Age of Onset  . Hypertension  Mother   . Seizures Mother     Social History:  History  Alcohol Use No     History  Drug Use No    Social History   Social History  . Marital Status: Divorced    Spouse Name: N/A  . Number of Children: N/A  . Years of Education: N/A   Social History Main Topics  . Smoking status: Never Smoker   . Smokeless tobacco: Never Used  . Alcohol Use: No  . Drug Use: No  . Sexual Activity: Not Currently    Birth Control/ Protection: None   Other Topics Concern  . None   Social History Narrative   Additional Social History:    Pain Medications: None Reported Prescriptions: Reports compliance Over the Counter: None Reported History of alcohol / drug use?: No history of alcohol / drug abuse Longest period of sobriety (when/how long): Pt denies   Sleep: Good  Appetite:  Good  Current Medications: Current Facility-Administered Medications  Medication Dose Route Frequency Provider Last Rate Last Dose  . acetaminophen (TYLENOL) tablet 650 mg  650 mg Oral Q6H PRN Minda Ditto, RPH   650 mg at 03/06/16 1821  . alum & mag hydroxide-simeth (MAALOX/MYLANTA) 200-200-20 MG/5ML suspension 30 mL  30 mL Oral Q4H PRN Benjamine Mola, FNP      . bacitracin ointment   Topical BID Myer Peer Cobos, MD      . escitalopram (LEXAPRO) tablet 20 mg  20 mg Oral Daily Jenne Campus, MD   20 mg at  03/07/16 0859  . ibuprofen (ADVIL,MOTRIN) tablet 600 mg  600 mg Oral Q6H PRN Laverle Hobby, PA-C   600 mg at 03/06/16 2128  . LORazepam (ATIVAN) tablet 0.5 mg  0.5 mg Oral Q6H PRN Myer Peer Cobos, MD      . magnesium hydroxide (MILK OF MAGNESIA) suspension 30 mL  30 mL Oral Daily PRN Benjamine Mola, FNP      . prazosin (MINIPRESS) capsule 1 mg  1 mg Oral QHS Jenne Campus, MD   1 mg at 03/06/16 2127  . traZODone (DESYREL) tablet 50 mg  50 mg Oral QHS,MR X 1 Laverle Hobby, PA-C   50 mg at 03/06/16 2127    Lab Results:  Results for orders placed or performed during the hospital encounter of  03/05/16 (from the past 48 hour(s))  TSH     Status: None   Collection Time: 03/07/16  6:20 AM  Result Value Ref Range   TSH 2.104 0.350 - 4.500 uIU/mL    Comment: Performed at [facility]    Blood Alcohol level:  Lab Results  Component Value Date   [facility] <5 03/04/2016   ETH <11 03/28/2014    Physical Findings: AIMS: Facial and Oral Movements Muscles of Facial Expression: None, normal Lips and Perioral Area: None, normal Jaw: None, normal Tongue: None, normal,Extremity Movements Upper (arms, wrists, hands, fingers): None, normal Lower (legs, knees, ankles, toes): None, normal, Trunk Movements Neck, shoulders, hips: None, normal, Overall Severity Severity of abnormal movements (highest score from questions above): None, normal Incapacitation due to abnormal movements: None, normal Patient's awareness of abnormal movements (rate only patient's report): No Awareness, Dental Status Current problems with teeth and/or dentures?: No Does patient usually wear dentures?: No  CIWA:  CIWA-Ar Total: 1 COWS:  COWS Total Score: 2  Musculoskeletal: Strength & Muscle Tone: within normal limits- mobilizes on wheel chair , due to fracture - see above  Gait & Station: normal Patient leans: N/A  Psychiatric Specialty Exam: ROS- denies headache, no chest pain, no shortness of breath, as above, reports some tingling sensation on toes. No pain, no fever, no chills   Blood pressure 109/72, pulse 109, temperature 98.5 F (36.9 C), temperature source Oral, resp. rate 18, height 5' 1"  (1.549 m), weight 174 lb (78.926 kg), SpO2 100 %.Body mass index is 32.89 kg/(m^2).  General Appearance: Fairly Groomed  Engineer, water::  Good  Speech:  Normal Rate  Volume:  Normal  Mood:  less depressed  Affect:  constricted, but more reactive   Thought Process:  Linear  Orientation:  Full (Time, Place, and Person)  Thought Content:  denies hallucinations, no delusions expressed   Suicidal  Thoughts:  No- denies any suicidal ideations at this time , no self injurious ideations, contracts for safety on the unit   Homicidal Thoughts:  No  Memory:  recent and remote grossly intact   Judgement:  Other:  improving   Insight:  improving   Psychomotor Activity:  Normal  Concentration:  Good  Recall:  Good  Fund of Knowledge:Good  Language: Good  Akathisia:  Negative  Handed:  Right  AIMS (if indicated):     Assets:  Desire for Improvement Resilience  ADL's:  Intact  Cognition: WNL  Sleep:  Number of Hours: 6.5  Assessment- patient presents less severely depressed and with a more reactive affect. Denies SI at this time. Tolerating medications well . Complains of some tingling on toes of affected leg, but no  abnormalities noted, see above . Treatment Plan Summary: Daily contact with patient to assess and evaluate symptoms and progress in treatment, Medication management, Plan inpatient admission  and medications as below  Continue to encourage group and milieu participation to work on coping skills and symptom reduction Continue Lexapro 20 mgrs QDAY for depression Continue Minipress 1 mgr QHS for PTSD related nightmares  Continue Ativan 0.5 mgrs Q 6 hours PRN for anxiety  Patient to inform RN /MD if any increasing or persisting symptoms affecting her foot/toes.  Neita Garnet, MD 03/07/2016, 4:14 PM

## 2016-03-07 NOTE — [facility] Group Notes (Addendum)
[facility] LCSW Group Therapy 03/07/2016 1:15 PM Type of Therapy: Group Therapy Participation Level: Active  Participation Quality: Attentive, Sharing and Supportive  Affect: Blunted  Cognitive: Alert and Oriented  Insight: Developing/Improving and Engaged  Engagement in Therapy: Developing/Improving and Engaged  Modes of Intervention: Clarification, Confrontation, Discussion, Education, Exploration, Limit-setting, Orientation, Problem-solving, Rapport Building, Dance movement psychotherapisteality Testing, Socialization and Support  Summary of Progress/Problems: The topic for today was feelings about relapse. Pt discussed what relapse prevention is to them and identified triggers that they are on the path to relapse. Pt processed their feeling towards relapse and was able to relate to peers. Pt discussed coping skills that can be used for relapse prevention. Patient discussed the difficulty of experiencing PTSD and feeling like others don't understand what she is going through. She identifies isolation as problematic for her. CSW and other group members provided patient with emotional support and encouragement.   Jasmine BruinKristin Demari [name], MSW, LCSW Clinical Social Worker [facility] [phone]

## 2016-03-07 NOTE — Progress Notes (Signed)
Patient ID: Jasmine [name], female   DOB: [birth year], 40 y.o.   MRN: [id number] D: Client up in dayroom this shift, reports "you missed my mom" "been reading Psalms 146" "I'm just going to start clean" A: Writer encouraged client to continue to use positive affirmations and spiritual support. Medications reviewed, administered as ordered. Staff will monitor q1115min for safety. R: client is safe on the unit.
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
## 2016-03-07 NOTE — [facility] Group Notes (Signed)
   [facility]  03/07/2016  8:45 AM   Participation Quality: Alert, Appropriate and Oriented  Mood/Affect: Blunted  Depression Rating: 1  Anxiety Rating: 0  Thoughts of Suicide: Pt denies SI/HI  Will you contract for safety? Yes  Current AVH: Pt denies  Plan for Discharge/Comments: Pt attended discharge planning group and actively participated in group. CSW provided pt with today's workbook. Patient plans to return home to follow up with outpatient services.   Transportation Means: Pt reports access to transportation  Supports: No supports mentioned at this time  Jasmine [name], MSW, Johnson & JohnsonLCSW Clinical Social Worker [facility] [phone]

## 2016-03-07 NOTE — Progress Notes (Addendum)
Jasmine [name] is seen sitting in the dayroom with her right leg propped up on a chair.She is calm and quiet. She completed her daily assessment and on it she wrote she denied SI this morning and she rated her depression, hopelessness and anxiety " 1/0/1/", respectively. She is concerned today because she says her right toes " are swollen today and have not been swollen like this". Her right foot has no temperature impairment ( in relationship to her left foot). She says she has feeling in her right foot and toes, she san flex  and dorsiflex her toes and there is no sign of acute swelling, edema, and / or skin being pinched ( where the foot emerges from the cast). She denies being up on her foot yesterday and says her foot feels like it has gone to sleep. A This info is passed on to her physician and she is instructed to limit her sodium intake, to keepo her foot elevated AMAP and tok notify staff if the status of her right foot changes. R Safety is in place.  Pt has made several complaints today about feeling like her right foot is " swollen up". Her mother called and spoke to this nurse and stated " i don't feel like you people are taking adequate care of my daughter". This nurse notified  Dr. Jama Flavorsobos ( initially ) this motrning when pt first made complaint and then requested  NP speak with pt, to ally her fears. Encouraged pt to keep foot elevated AMAP, drink plenty of water and to monitor salt intake. Pt reports the " doctor saw me and tole me to keep my foot elevated".

## 2016-03-07 NOTE — Progress Notes (Signed)
Adult Psychoeducational Group Note  Date:  03/07/2016 Time:  8:20 PM  Group Topic/Focus:  Wrap-Up Group:   The focus of this group is to help patients review their daily goal of treatment and discuss progress on daily workbooks.  Participation Level:  Active  Participation Quality:  Appropriate  Affect:  Appropriate  Cognitive:  Appropriate  Insight: Appropriate  Engagement in Group:  Engaged  Modes of Intervention:  Discussion  Additional Comments:  Pt was pleasant during wrap-up group. Pt rated her overall day a 10 out of 10 because "I woke up today". Pt reported that she achieved her goal for the day, which was "to put everything into perspective".   Cleotilde NeerJasmine S Amira Podolak 03/07/2016, 9:20 PM

## 2016-03-08 NOTE — Progress Notes (Signed)
Adult Psychoeducational Group Note  Date:  03/08/2016 Time:  9:05 PM  Group Topic/Focus:  Wrap-Up Group:   The focus of this group is to help patients review their daily goal of treatment and discuss progress on daily workbooks.  Participation Level:  Active  Participation Quality:  Appropriate and Attentive  Affect:  Appropriate  Cognitive:  Appropriate  Insight: Appropriate  Engagement in Group:  Engaged  Modes of Intervention:  Discussion  Additional Comments:  Pt stated her goal for today was to let the past stay in the past. Pt has learned 2 coping skills. 1) taking a bath, 2) music.  Caswell CorwinOwen, Lyndsi Altic C 03/08/2016, 9:05 PM
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
## 2016-03-08 NOTE — Progress Notes (Signed)
D- Patient is depressed with a brighter affect this shift.  Patient is observed in the Dayroom playing board games and interacting well with her peers.  Patient reports that she is feeling "better".  Patient reports that she has some financial issues to face when she is discharged.  Patient states that she is going to continue to focus on herself and not let her financial issues overwhelm her.  Patient currently denies SI, HI, and AVH.  Patient denies any feelings of depression, hopelessness, or anxiety.  Patient's goal for today is "not dwell on the past".  No complaints.   A- Support and encouragement provided.  Routine safety checks conducted every 15 minutes.  Patient informed to notify staff with problems or concerns. R- Patient contracts for safety at this time. Patient compliant with medications and treatment plan. Patient receptive, calm, and cooperative. Patient remains safe at this time.

## 2016-03-08 NOTE — [facility] Group Notes (Signed)
[facility] LCSW Group Therapy  03/08/2016   10:00 AM   Type of Therapy:  Group Therapy  Participation Level:  Active  Participation Quality:  Appropriate and Attentive  Affect:  Calm  Cognitive:  Alert and Appropriate  Insight:  Developing/Improving and Engaged  Engagement in Therapy:  Developing/Improving and Engaged  Modes of Intervention:  Clarification, Confrontation, Discussion, Education, Exploration, Limit-setting, Orientation, Problem-solving, Rapport Building, Dance movement psychotherapisteality Testing, Socialization and Support  Summary of Progress/Problems: Today's group topic was healthy coping skills. Group members discussed positive, healthy coping skills that have worked for them in the past and were able to relate to one another on what has worked/hasn't worked. Group members were asked to process how these positive coping skills could be utilized in the future, to prevent relapse.  Pt shared that she suffers from PTSD and processed her suicide attempt, which led to this hospitalization.  Pt states that she is trying to stay positive and stay in the present.  Pt shared that although eager to discharge, she accepts the doctor saying she needs to stay in the hospital due to the severity of her suicide attempt.  Pt actively participated and was engaged in group discussion.    Jeanelle MallingChelsea Trask Vosler, Kentucky[place]CSW 03/08/2016 1:37 PM

## 2016-03-09 NOTE — [facility] Group Notes (Signed)
03/09/2016  10:00 AM   Type of Therapy and Topic: Group Therapy: Developing Self Esteem and Improving Support System   Participation Level: Engaged well with group today. Willing to participate with support from facilitator.   Description of Group:   Participants chose group topic based on their identified challenges. Participants discussed challenges of poor self-esteem and asked questions to gain better understanding of the issue. Group discussion processed roots of self-esteem in order to identify a plan to work on self-esteem.   Therapeutic Goals Addressed in Processing Group:               1)  Identify self-esteem and how it is influenced of others.             2)  Acknowledge the importance of being honest with yourself about feelings and self-worth.             3)  Identify supports that will affirm your esteem.             4)  Identify activities to acceptance of self.    Summary of Patient Progress:   Patient was well engaged and participated in group. Patient was able to share about her progress toward self acceptance and positive change.   Beverly Sessionsywan J Verania Salberg MSW, LCSW

## 2016-03-09 NOTE — Progress Notes (Signed)
Jasmine [name] looks really good this morning. She is seen sitting in the dayroom...with the other patients..writing in her journal. She smiles broadly when she looks up and makes eye contact with this Clinical research associatewriter. She says " i apologize for how rude my mother acted the other day". She says her right foot is feeling " better today". She says ' I've been trying to keep it elevated AMAP and I think that' s helping ". A She takes her scheduled medications as ordered. She completes her daily assessment and on it she wrote she denied SI today and she rates her feelings of depression, hopelessness and anxiety " 0/0/0/", respectively. R Safety is in place. No deficit in neurovascular status   ( Right leg / foot ) identified by this Clinical research associatewriter. R Safety is in place and poc cont.

## 2016-03-09 NOTE — Progress Notes (Signed)
D.  Pt pleasant on approach, denies complaints at this time.  Positive for evening wrap up group with appropriate participation.  Observed interacting appropriately within the milieu.  Denies SI/HI/hallucinatons at this time.  A.  Support and encouragement offered, medication given as ordered  R. Pt remains safe on the unit, will continue to monitor.
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
## 2016-03-09 NOTE — Progress Notes (Signed)
Adult Psychoeducational Group Note  Date:  03/09/2016 Time:  9:15 PM  Group Topic/Focus:  Wrap-Up Group:   The focus of this group is to help patients review their daily goal of treatment and discuss progress on daily workbooks.  Participation Level:  Active  Participation Quality:  Appropriate and Attentive  Affect:  Appropriate  Cognitive:  Appropriate  Insight: Appropriate  Engagement in Group:  Engaged  Modes of Intervention:  Discussion  Additional Comments:  Pts played a therapeutic activity of Wellness Jeopardy.  Jasmine [name] C 03/09/2016, 9:15 PM 

## 2016-03-09 NOTE — [facility] Group Notes (Signed)
[facility] Group Notes:  (Nursing/MHT/Case Management/Adjunct)  Date:  03/09/2016  Time:  7:32 PM  Type of Therapy:  Psychoeducational Skills  Participation Level:  Minimal  Participation Quality:  Appropriate  Affect:  Blunted  Cognitive:  Appropriate  Insight:  Appropriate  Engagement in Group:  Engaged  Modes of Intervention:  Discussion and Education  Summary of Progress/Problems: Patient attended group and received daily workbook.   Marzetta BoardDopson, Rayford Williamsen E 03/09/2016, 7:32 PM

## 2016-03-09 NOTE — Progress Notes (Signed)
D. Pt pleasant on approach, no complaints voiced at this time.  Positive for evening wrap up group, interacting appropriately with peers within milieu.  Denies SI/HI/hallucinations at this time.  A.  Support and encouragement offered  R.  Pt remains safe on the unit, will continue to monitor.

## 2016-03-10 MED ORDER — TRAZODONE HCL 50 MG PO TABS: 50.0000 mg | ORAL_TABLET | Freq: Every evening | ORAL | Status: DC | PRN

## 2016-03-10 MED ORDER — ESCITALOPRAM OXALATE 20 MG PO TABS: 20.0000 mg | ORAL_TABLET | Freq: Every day | ORAL | Status: DC

## 2016-03-10 MED ORDER — PRAZOSIN HCL 1 MG PO CAPS: 1.0000 mg | ORAL_CAPSULE | Freq: Every day | ORAL | Status: DC

## 2016-03-10 NOTE — [facility] Suicide Risk Assessment (Signed)
[facility] INPATIENT:  Family/Significant Other Suicide Prevention Education  Suicide Prevention Education:  Education Completed; Jenell MillinerVelma Evans, Pt's mother [phone],  has been identified by the patient as the family member/significant other with whom the patient will be residing, and identified as the person(s) who will aid the patient in the event of a mental health crisis (suicidal ideations/suicide attempt).  With written consent from the patient, the family member/significant other has been provided the following suicide prevention education, prior to the and/or following the discharge of the patient.  The suicide prevention education provided includes the following:  Suicide risk factors  Suicide prevention and interventions  National Suicide Hotline telephone number  [facility] assessment telephone number  [facility] Emergency Assistance 911  [facility] and/or Residential Mobile Crisis Unit telephone number  Request made of family/significant other to:  Remove weapons (e.g., guns, rifles, knives), all items previously/currently identified as safety concern.    Remove drugs/medications (over-the-counter, prescriptions, illicit drugs), all items previously/currently identified as a safety concern.  The family member/significant other verbalizes understanding of the suicide prevention education information provided.  The family member/significant other agrees to remove the items of safety concern listed above.  Elaina Hoopsarter, Dannah Ryles M 03/10/2016, 10:28 AM

## 2016-03-10 NOTE — [facility] Suicide Risk Assessment (Signed)
[facility] Discharge Suicide Risk Assessment   Principal Problem: MDD (major depressive disorder), recurrent episode, moderate (HCC) Discharge Diagnoses:  Patient Active Problem List   Diagnosis Date Noted  . MDD (major depressive disorder), recurrent episode, moderate (HCC) [F33.1] 03/05/2016  . MDD (major depressive disorder) (HCC) [F32.9] 03/28/2014    Total Time spent with patient: 30 minutes  Musculoskeletal: Strength & Muscle Tone: within normal limits Gait & Station: in wheel chair due to R leg fracture, non weight bearing at this time  Patient leans: N/A  Psychiatric Specialty Exam: ROS  Blood pressure 117/71, pulse 102, temperature 98.6 F (37 C), temperature source Oral, resp. rate 16, height  (1.549 m), weight 174 lb (78.926 kg), SpO2 100 %.Body mass index is 32.89 kg/(m^2).  General Appearance: Well Groomed  Eye Contact::  Good  Speech:  Normal Rate409  Volume:  Normal  Mood:  improved and today denies feeling depressed   Affect:  Appropriate and reactive   Thought Process:  Linear  Orientation:  Full (Time, Place, and Person)  Thought Content:  denies hallucinations, no delusions, not internally preoccupied   Suicidal Thoughts:  No denies any suicidal ideations, denies any self injurious ideations   Homicidal Thoughts:  No  Memory:  recent and remote grossly intact   Judgement:  Other:  improved  Insight:  improved   Psychomotor Activity:  Normal  Concentration:  Good  Recall:  Good  Fund of Knowledge:Good  Language: Good  Akathisia:  Negative  Handed:  Right  AIMS (if indicated):     Assets:  Communication Skills Desire for Improvement Resilience  Sleep:  Number of Hours: 6.75  Cognition: WNL  ADL's:  Intact   Mental Status Per Nursing Assessment::   On Admission:     Demographic Factors:  40 year old female , divorced, no children   Loss Factors: Financial issues, IRS involvement causing financial worries, stress    Historical Factors: Prior  psychiatric admissions for depression  Risk Reduction Factors:   Sense of responsibility to family and Positive coping skills or problem solving skills  Continued Clinical Symptoms:  At this time patient is improved compared to admission - presents alert, attentive, well related, pleasant, calm, mood significantly improved compared to admission and at this time does not feel significantly depressed , no hallucinations, no delusions , future oriented. At present denies any medication side effects.   Cognitive Features That Contribute To Risk:  No gross cognitive deficits noted upon discharge. Is alert , attentive, and oriented x 3   Suicide Risk:  Mild:  Suicidal ideation of limited frequency, intensity, duration, and specificity.  There are no identifiable plans, no associated intent, mild dysphoria and related symptoms, good self-control (both objective and subjective assessment), few other risk factors, and identifiable protective factors, including available and accessible social support.  Follow-up Information    Follow up with [facility].   Why:  Medication management on 4/4 @ 1:45pm with Robin. Therapy with Beth on 4/4. Okey Regal will call you with possible appt tomorrow if there is a cancellation.   Contact information:   [address] [place], [place] [address] [phone]      Plan Of Care/Follow-up recommendations:  Activity:  as tolerated  Diet:  Regular Tests:  NA Other:  See below  Patient looking forward to discharge today. Leaving unit in good spirits . Plans to follow up as above, and plans to follow up with outpatient orthopedic clinic for management of her fracture as needed . States  that her immediate family is very supportive and will help her with shopping and transportation until she is able to mobilize independently again . Nehemiah MassedOBOS, FERNANDO, MD 03/10/2016, 12:10 PM

## 2016-03-10 NOTE — Progress Notes (Signed)
  [facility] :  Will you be returning to the same living situation after discharge:  Yes,  Pt returning home At discharge, do you have transportation home?: Yes,  Pt family to pick up Do you have the ability to pay for your medications: Yes,  Pt provided with prescriptions  Release of information consent forms completed and in the chart;  Patient's signature needed at discharge.  Patient to Follow up at: Follow-up Information    [facility]   Why:  Medication management on 4/4 @ 1:45pm with Robin. Therapy with Beth on 4/4. Okey RegalCarol will call you with possible appt tomorrow if there is a cancellation.   Contact information:   [address] [place], [place] [address] [phone]      Next level of care provider has access to [facility] Link:no  Safety Planning and Suicide Prevention discussed: Yes,  with mother; see SPE note for further details  Have you used any form of tobacco in the last 30 days? (Cigarettes, Smokeless Tobacco, Cigars, and/or Pipes): No  Has patient been referred to the Quitline?: N/A patient is not a smoker  Patient has been referred for addiction treatment: N/A  Elaina HoopsCarter, Taggert Bozzi M 03/10/2016, 12:57 PM

## 2016-03-10 NOTE — Progress Notes (Signed)
Discharge note:  Patient discharged home per MD order.  Reviewed patient's discharge instructions.  Reviewed medications and prescriptions.  Patient given transition report.  She denies SI/HI/AVH.  Patient indicated understanding of all instructions and follow up appointments.  Patient left on crutches with her ride.  She left in good spirits.
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
## 2016-03-10 NOTE — [facility] Group Notes (Signed)
[facility]  03/10/2016 8:45 AM  Participation Quality: Alert, Appropriate and Oriented  Mood/Affect: Appropriate  Depression Rating: 0  Anxiety Rating: 0  Thoughts of Suicide: Pt denies SI/HI  Will you contract for safety? Yes  Current AVH: Pt denies  Plan for Discharge/Comments: Pt attended discharge planning group and actively participated in group. CSW discussed suicide prevention education with the group and encouraged them to discuss discharge planning and any relevant barriers. Pt reports improvement in mood and expresses that she would like to discharge soon. Requests a work note.   Transportation Means: Pt reports access to transportation  Supports: No supports mentioned at this time  Chad CordialLauren Carter, LCSWA 03/10/2016 10:15 AM

## 2016-03-10 NOTE — Progress Notes (Signed)
Recreation Therapy Notes  Date: 03.27.2017 Time: 9:30am Location: 300 Hall Group Room   Group Topic: Stress Management  Goal Area(s) Addresses:  Patient will actively participate in stress management techniques presented during session.   Behavioral Response: Appropriate   Intervention: Stress management techniques  Activity :  Diaphragmatic Breathing and Guided Imagery. LRT provided education, instruction and demonstration on practice of  Diaphragmatic Breathing and Guided Imagery. Patient was asked to participate in technique introduced during session.   Education:  Stress Management, Discharge Planning.   Education Outcome: Acknowledges education  Clinical Observations/Feedback: Patient actively engaged in technique introduced, expressed no concerns and demonstrated ability to practice independently post d/c.   Marykay Lexenise L Osten Janek, LRT/CTRS         Jearl KlinefelterBlanchfield, Lary Eckardt L 03/10/2016 12:58 PM

## 2016-03-10 NOTE — Tx Team (Signed)
Interdisciplinary Treatment Plan Update (Adult) Date: 03/10/2016   Date: 03/10/2016 10:18 AM  Progress in Treatment:  Attending groups: Yes  Participating in groups: Yes  Taking medication as prescribed: Yes  Tolerating medication: Yes  Family/Significant othe contact made: Yes, with mother Patient understands diagnosis: Yes AEB seeking help with depression Discussing patient identified problems/goals with staff: Yes  Medical problems stabilized or resolved: Yes  Denies suicidal/homicidal ideation: Yes Patient has not harmed self or Others: Yes   New problem(s) identified: None identified at this time.   Discharge Plan or Barriers: Pt will return home and follow-up with [facility].  Additional comments:  Patient and CSW reviewed pt's identified goals and treatment plan. Patient verbalized understanding and agreed to treatment plan. CSW reviewed [facility] "Discharge Process and Patient Involvement" Form. Pt verbalized understanding of information provided and signed form.   Reason for Continuation of Hospitalization:  Anxiety Depression Medication stabilization Suicidal ideation  Estimated length of stay: 0-1 days  Review of initial/current patient goals per problem list:   1.  Goal(s): Patient will participate in aftercare plan  Met:  Yes  Target date: 3-5 days from date of admission   As evidenced by: Patient will participate within aftercare plan AEB aftercare provider and housing plan at discharge being identified.   03/06/16: CSW will assess for appropriate discharge plan and relevant barriers.   03/10/16: Pt will return home and follow-up with [facility]  2.  Goal (s): Patient will exhibit decreased depressive symptoms and suicidal ideations.  Met:  Yes  Target date: 3-5 days from date of admission   As evidenced by: Patient will utilize self rating of depression at 3 or below and demonstrate decreased signs of depression or be deemed stable for  discharge by MD.  03/06/16: Pt rates depression at 9/10; denies SI  03/10/16: Pt rates depression at 0/10; denies SI  3.  Goal(s): Patient will demonstrate decreased signs and symptoms of anxiety.  Met:  Yes  Target date: 3-5 days from date of admission   As evidenced by: Patient will utilize self rating of anxiety at 3 or below and demonstrated decreased signs of anxiety, or be deemed stable for discharge by MD  03/06/16: Pt rates anxiety at 8/10  03/10/16: Pt rates anxiety at 0/10  Attendees:  Patient:    Family:    Physician: Dr. Parke Poisson, MD  03/10/2016 10:18 AM  Nursing: Lars Pinks, RN Case manager  03/10/2016 10:18 AM  Clinical Social Worker Peri Maris, [place] 03/10/2016 10:18 AM  Other: Erasmo Downer Drinkard, [place] 03/10/2016 10:18 AM  Clinical: Mayra Neer, RN; Gaylan Gerold RN 03/10/2016 10:18 AM  Other: , RN Charge Nurse 03/10/2016 10:18 AM  Other:     Peri Maris, [place] Work [phone]

## 2016-08-06 ENCOUNTER — Encounter (HOSPITAL_COMMUNITY): Payer: Self-pay | Admitting: Emergency Medicine

## 2016-08-06 ENCOUNTER — Ambulatory Visit (HOSPITAL_COMMUNITY): Admission: EM | Admit: 2016-08-06 | Discharge: 2016-08-06 | Disposition: A | Discharge status: Home / Self Care

## 2016-08-06 DIAGNOSIS — B86 Scabies: Secondary | ICD-10-CM | POA: Diagnosis not present

## 2016-08-06 MED ORDER — PERMETHRIN 5 % EX CREA: 1.0000 "application " | TOPICAL_CREAM | Freq: Once | CUTANEOUS | 0 refills | Status: AC

## 2016-08-06 NOTE — ED Provider Notes (Signed)
CSN: [id number]     Arrival date & time 08/06/16  1001 History   First MD Initiated Contact with Patient 08/06/16 1113     Chief Complaint  Patient presents with  . Rash   (Consider location/radiation/quality/duration/timing/severity/associated sxs/prior Treatment) Miss Jasmine [name] is a 40 year old well-appearing female, presents today for rash. Patient reports that the rash has been present for 2 weeks. She reports that the rash is getting worse. Patient reports the rash to be located on her neck,  abdomen, fingers, and her bilateral groins. She denies any recent contact with new skin products or laundry detergent. She also denies any recent new medications. Patient describes the rash as very little small bumps and the rash itches. Patient have tried several OTC medication and herbal therapies with little relief. Patient denies fever at home. She did spend a night in the hotel 2 weeks ago prior to onset of her symptom.       Past Medical History:  Diagnosis Date  . Anxiety   . Depression   . Ectopic pregnancy   . Migraine   . Osteoarthritis   . Plantar fasciitis   . PTSD (post-traumatic stress disorder)    Past Surgical History:  Procedure Laterality Date  . ECTOPIC PREGNANCY SURGERY  10/2000  . ECTOPIC PREGNANCY SURGERY  01/2004   Family History  Problem Relation Age of Onset  . Hypertension Mother   . Seizures Mother    Social History  Substance Use Topics  . Smoking status: Never Smoker  . Smokeless tobacco: Never Used  . Alcohol use No   OB History    No data available     Review of Systems  Skin: Positive for rash.  All other systems reviewed and are negative.   Allergies  Review of patient's allergies indicates no known allergies.  Home Medications   Prior to Admission medications   Medication Sig Start Date End Date Taking? Authorizing Provider  buPROPion (WELLBUTRIN XL) 300 MG 24 hr tablet Take 300 mg by mouth daily.   Yes Historical Provider, MD   escitalopram (LEXAPRO) 20 MG tablet Take 1 tablet (20 mg total) by mouth daily. 03/10/16  Yes Adonis BrookSheila Agustin, NP  lurasidone (LATUDA) 40 MG TABS tablet Take 20 mg by mouth daily with breakfast.   Yes Historical Provider, MD  traZODone (DESYREL) 50 MG tablet Take 1 tablet (50 mg total) by mouth at bedtime and may repeat dose one time if needed. 03/10/16  Yes Adonis BrookSheila Agustin, NP  permethrin (ELIMITE) 5 % cream Apply 1 application topically once. 08/06/16 08/06/16  Lucia EstelleFeng Verleen Stuckey, NP  prazosin (MINIPRESS) 1 MG capsule Take 1 capsule (1 mg total) by mouth at bedtime. 03/10/16   Adonis BrookSheila Agustin, NP   Meds Ordered and Administered this Visit  Medications - No data to display  BP 125/81 (BP Location: Left Arm)   Pulse 81   Temp 98.5 F (36.9 C) (Oral)   LMP 07/24/2016 (Exact Date)   SpO2 100%  No data found.   Physical Exam  Constitutional: She appears well-developed and well-nourished.  Cardiovascular: Normal rate.   Pulmonary/Chest: Effort normal.  Skin:  Fine pinpoint rash noted on her neck, abdomen, finger and legs.   Nursing note and vitals reviewed.   Urgent Care Course   Clinical Course    Procedures (including critical care time)  Labs Review Labs Reviewed - No data to display  Imaging Review No results found.  MDM   1. Scabies      Patient educated on  the diagnosis. Patient informed that family members who sleep in the same room or has been in direct contact with the patient would also need treatment. Instructed to wash clothes and bedding in hot water, and items that cannot be washed can be placed in plastic bags for 72 hours. Instructed to apply the topical medicine from the neck down to the sole of the feet and leave the cream on for 8-14 hours before washing it off. Informed to apply the cream once today and reapply in 1 week. Made aware that the itchy may persist up to 2-4 weeks after treatment. Informed that benadryl OTC may provide relief for the itchiness. Instructed to  follow up with her PCP if she does not improve.     Lucia EstelleFeng Shanterica Biehler, NP 08/06/16 [phone]

## 2016-08-06 NOTE — ED Triage Notes (Signed)
Pt reports a rash that is on several places on her body for about two weeks.  She has visible hives on her left neck and both legs around the groin area.  She also reports she had hives on her abdomen this morning and itching on the back of her right leg.  She has used several OTC medications and herbal therapies with little relief.  She states Benadryl cream has helped with itching. She denies any fever.

## 2020-03-01 ENCOUNTER — Ambulatory Visit: Attending: Family

## 2020-03-01 DIAGNOSIS — Z23 Encounter for immunization: Secondary | ICD-10-CM

## 2020-03-01 NOTE — Progress Notes (Signed)
   Covid-19 Vaccination Clinic  Name:  Jasmine [name]    MRN: [id number] DOB: 09-18-1976  03/01/2020  [name] was observed post Covid-19 immunization for 15 minutes without incident. She was provided with Vaccine Information Sheet and instruction to access the V-Safe system.   [name] was instructed to call 911 with any severe reactions post vaccine: Marland Kitchen Difficulty breathing  . Swelling of face and throat  . A fast heartbeat  . A bad rash all over body  . Dizziness and weakness   Immunizations Administered    Name Date Dose VIS Date Route   Moderna COVID-19 Vaccine 03/01/2020 12:45 PM 0.5 mL 11/15/2019 Intramuscular   Manufacturer: Moderna   Lot: [id number]   NDC: 72820-601-56

## 2020-04-03 ENCOUNTER — Ambulatory Visit: Attending: Family

## 2020-04-03 DIAGNOSIS — Z23 Encounter for immunization: Secondary | ICD-10-CM

## 2020-04-03 NOTE — Progress Notes (Signed)
   Covid-19 Vaccination Clinic  Name:  ALIEAH BRINTON    MRN: [id number] DOB: 1976/11/02  04/03/2020  Ms. Calame was observed post Covid-19 immunization for 15 minutes without incident. She was provided with Vaccine Information Sheet and instruction to access the V-Safe system.   Ms. Corl was instructed to call 911 with any severe reactions post vaccine: Marland Kitchen Difficulty breathing  . Swelling of face and throat  . A fast heartbeat  . A bad rash all over body  . Dizziness and weakness   Immunizations Administered    Name Date Dose VIS Date Route   Moderna COVID-19 Vaccine 04/03/2020 11:17 AM 0.5 mL 11/2019 Intramuscular   Manufacturer: Moderna   Lot: [id number]   NDC: 31497-026-37

## 2020-12-15 DIAGNOSIS — G08 Intracranial and intraspinal phlebitis and thrombophlebitis: Secondary | ICD-10-CM

## 2020-12-15 HISTORY — DX: Intracranial and intraspinal phlebitis and thrombophlebitis: G08

## 2021-03-07 ENCOUNTER — Inpatient Hospital Stay (HOSPITAL_BASED_OUTPATIENT_CLINIC_OR_DEPARTMENT_OTHER)
Admission: EM | Admit: 2021-03-07 | Discharge: 2021-03-11 | DRG: 093 | Disposition: A | Discharge status: Home / Self Care | Attending: Internal Medicine | Admitting: Internal Medicine

## 2021-03-07 ENCOUNTER — Emergency Department (HOSPITAL_BASED_OUTPATIENT_CLINIC_OR_DEPARTMENT_OTHER)

## 2021-03-07 ENCOUNTER — Encounter (HOSPITAL_BASED_OUTPATIENT_CLINIC_OR_DEPARTMENT_OTHER): Payer: Self-pay

## 2021-03-07 ENCOUNTER — Other Ambulatory Visit: Payer: Self-pay

## 2021-03-07 DIAGNOSIS — E785 Hyperlipidemia, unspecified: Secondary | ICD-10-CM | POA: Diagnosis present

## 2021-03-07 DIAGNOSIS — G43909 Migraine, unspecified, not intractable, without status migrainosus: Secondary | ICD-10-CM | POA: Diagnosis present

## 2021-03-07 DIAGNOSIS — F32A Depression, unspecified: Secondary | ICD-10-CM | POA: Diagnosis present

## 2021-03-07 DIAGNOSIS — Z20822 Contact with and (suspected) exposure to covid-19: Secondary | ICD-10-CM | POA: Diagnosis present

## 2021-03-07 DIAGNOSIS — Z79899 Other long term (current) drug therapy: Secondary | ICD-10-CM

## 2021-03-07 DIAGNOSIS — D649 Anemia, unspecified: Secondary | ICD-10-CM | POA: Diagnosis present

## 2021-03-07 DIAGNOSIS — F4310 Post-traumatic stress disorder, unspecified: Secondary | ICD-10-CM | POA: Diagnosis present

## 2021-03-07 DIAGNOSIS — R7303 Prediabetes: Secondary | ICD-10-CM | POA: Diagnosis present

## 2021-03-07 DIAGNOSIS — F419 Anxiety disorder, unspecified: Secondary | ICD-10-CM | POA: Diagnosis present

## 2021-03-07 DIAGNOSIS — F329 Major depressive disorder, single episode, unspecified: Secondary | ICD-10-CM

## 2021-03-07 DIAGNOSIS — Z6835 Body mass index (BMI) 35.0-35.9, adult: Secondary | ICD-10-CM | POA: Diagnosis not present

## 2021-03-07 DIAGNOSIS — G08 Intracranial and intraspinal phlebitis and thrombophlebitis: Secondary | ICD-10-CM | POA: Diagnosis present

## 2021-03-07 DIAGNOSIS — E669 Obesity, unspecified: Secondary | ICD-10-CM | POA: Diagnosis present

## 2021-03-07 DIAGNOSIS — I6389 Other cerebral infarction: Secondary | ICD-10-CM | POA: Diagnosis not present

## 2021-03-07 LAB — URINALYSIS, ROUTINE W REFLEX MICROSCOPIC
Bilirubin Urine: NEGATIVE
Glucose, UA: NEGATIVE mg/dL
Hgb urine dipstick: NEGATIVE
Ketones, ur: NEGATIVE mg/dL
Leukocytes,Ua: NEGATIVE
Nitrite: NEGATIVE
Protein, ur: NEGATIVE mg/dL
Specific Gravity, Urine: <1.005 — ABNORMAL LOW (ref 1.005–1.030)
pH: 6 (ref 5.0–8.0)

## 2021-03-07 LAB — RESP PANEL BY RT-PCR (FLU A&B, COVID) ARPGX2
Influenza A by PCR: NEGATIVE
Influenza B by PCR: NEGATIVE
SARS Coronavirus 2 by RT PCR: NEGATIVE

## 2021-03-07 LAB — CBC WITH DIFFERENTIAL/PLATELET
Abs Immature Granulocytes: 0.02 10*3/uL (ref 0.00–0.07)
Basophils Absolute: 0 10*3/uL (ref 0.0–0.1)
Basophils Relative: 0 %
Eosinophils Absolute: 0.2 10*3/uL (ref 0.0–0.5)
Eosinophils Relative: 3 %
HCT: 36.4 % (ref 36.0–46.0)
Hemoglobin: 11.9 g/dL — ABNORMAL LOW (ref 12.0–15.0)
Immature Granulocytes: 0 %
Lymphocytes Relative: 27 %
Lymphs Abs: 1.6 10*3/uL (ref 0.7–4.0)
MCH: 29.8 pg (ref 26.0–34.0)
MCHC: 32.7 g/dL (ref 30.0–36.0)
MCV: 91.2 fL (ref 80.0–100.0)
Monocytes Absolute: 0.5 10*3/uL (ref 0.1–1.0)
Monocytes Relative: 8 %
Neutro Abs: 3.6 10*3/uL (ref 1.7–7.7)
Neutrophils Relative %: 62 %
Platelets: 264 10*3/uL (ref 150–400)
RBC: 3.99 MIL/uL (ref 3.87–5.11)
RDW: 13.8 % (ref 11.5–15.5)
WBC: 5.8 10*3/uL (ref 4.0–10.5)
nRBC: 0 % (ref 0.0–0.2)

## 2021-03-07 LAB — BASIC METABOLIC PANEL
Anion gap: 10 (ref 5–15)
BUN: 12 mg/dL (ref 6–20)
CO2: 23 mmol/L (ref 22–32)
Calcium: 8.5 mg/dL — ABNORMAL LOW (ref 8.9–10.3)
Chloride: 103 mmol/L (ref 98–111)
Creatinine, Ser: 0.91 mg/dL (ref 0.44–1.00)
GFR, Estimated: >60 mL/min (ref >60)
Glucose, Bld: 120 mg/dL — ABNORMAL HIGH (ref 70–99)
Potassium: 3.9 mmol/L (ref 3.5–5.1)
Sodium: 136 mmol/L (ref 135–145)

## 2021-03-07 LAB — PREGNANCY, URINE: Preg Test, Ur: NEGATIVE

## 2021-03-07 IMAGING — CT CT VENOGRAM HEAD
1 of 10 series · 6 of 47 positions shown · IV contrast (omnipaque)
Comparison: Prior head CT examination [DATE].

CLINICAL DATA: Dural venous sinus thrombosis suspected; atypical
headache, blurry vision. Additional history provided: Patient
reports intermittent headache for 1 and half weeks with some blurred
vision, recent hypertensive readings, history of migraines.

EXAM:
CT VENOGRAM HEAD
TECHNIQUE: Contiguous axial images were obtained from the base of the skull
through the vertex without intravenous contrast. Subsequently, CT
venography of the head was performed following bolus administration
of 100 mL Omnipaque 300 intravenous contrast. Coronal and sagittal
thin reconstructions were submitted. Axial, coronal and sagittal
thick MIP reconstructions were also submitted.
CONTRAST:  100mL OMNIPAQUE IOHEXOL 300 MG/ML  SOLN

[Series 6: head venogram · axial · 0.39mm/px · z∈[-177,-75]mm · 6 of 73 slices shown]
[im 11/73  brain]
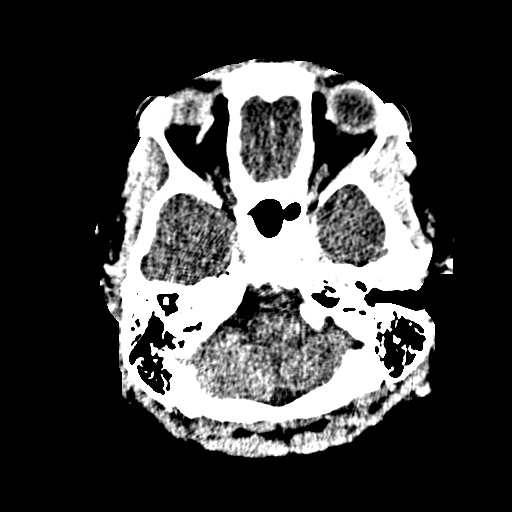
[im 21/73  bone]
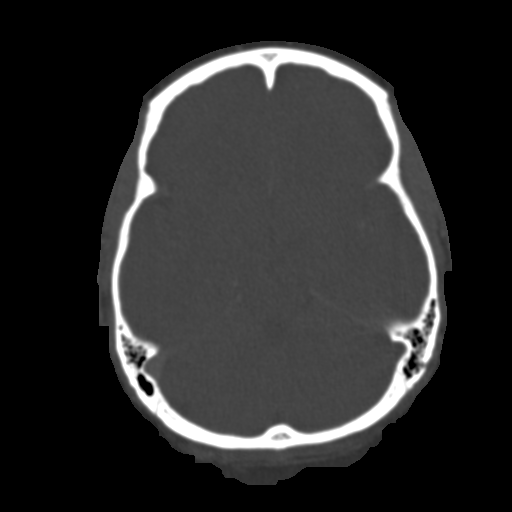
[im 31/73  brain]
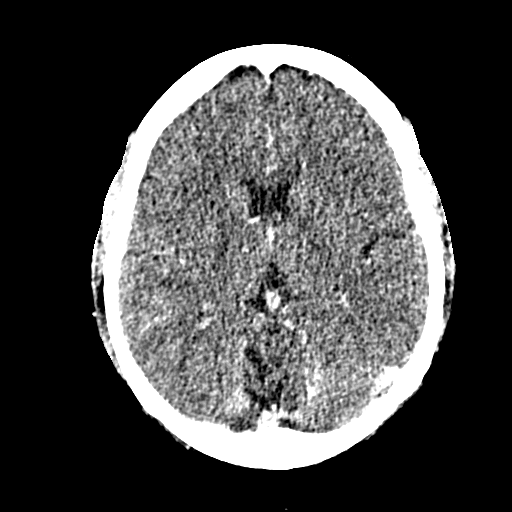
[im 42/73  bone]
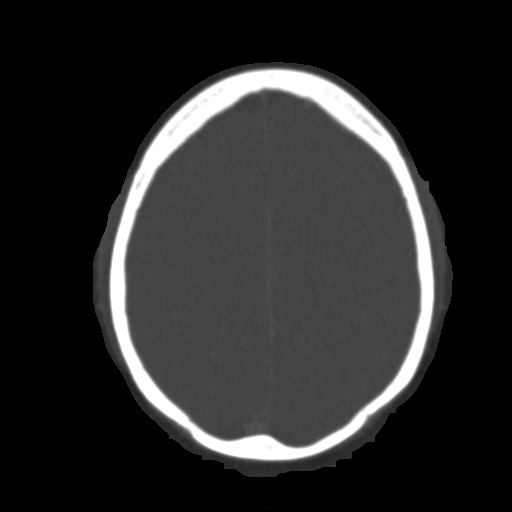
[im 52/73  brain]
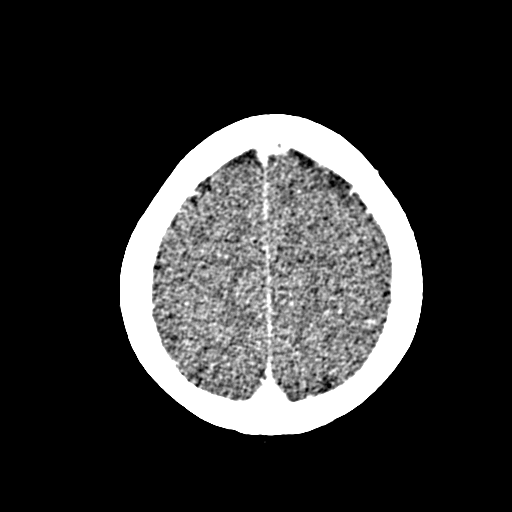
[im 62/73  bone]
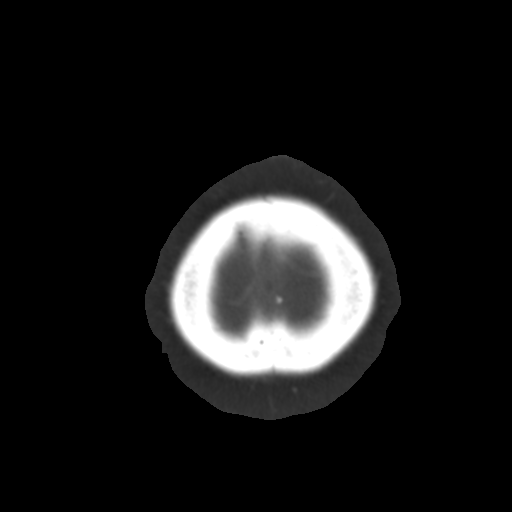

[6 of 47 positions shown; findings below may reference images not displayed]

FINDINGS: CT Head:

Brain:

Cerebral volume is normal.

There is no acute intracranial hemorrhage.

No demarcated cortical infarct.

No extra-axial fluid collection.

No evidence of intracranial mass.

No midline shift.

Vascular: No hyperdense vessel.

Skull: Normal. Negative for fracture or focal lesion.

Sinuses/Orbits: Visualized orbits show no acute finding. No
significant paranasal sinus disease at the imaged levels.

CT venogram head:

Filling defect within the inferior aspect of the superior sagittal
sinus and confluence of sinuses compatible with subocclusive
thrombus. Filling defect throughout the left transverse and sigmoid
dural venous sinuses compatible with near occlusive thrombus. There
appears to be some enhancement within the upper left internal
jugular vein. Elsewhere, the superior sagittal sinus is patent. The
internal cerebral veins, vein of DWIGHT, straight sinus, right
transverse sinus and right sigmoid sinus appear patent. Enhancement
is seen within the visualized upper right internal jugular vein.

These results were called by telephone at the time of interpretation
on [DATE] at [DATE] to provider DWIGHT , who verbally
acknowledged these results.
IMPRESSION: 1. Subocclusive thrombus within the inferior aspect of the superior
sagittal sinus and confluence of sinuses.
2. Near occlusive thrombus throughout the left transverse and
sigmoid dural venous sinuses. Some enhancement is seen within the
visualized upper left internal jugular vein.
3. No evidence of acute intracranial hemorrhage or acute infarct.

## 2021-03-07 MED ORDER — DIPHENHYDRAMINE HCL 50 MG/ML IJ SOLN
12.5000 mg | Freq: Once | INTRAMUSCULAR | Status: AC
  Administered 2021-03-07: 12.5 mg via INTRAVENOUS
  Filled 2021-03-07: qty 1

## 2021-03-07 MED ORDER — METOCLOPRAMIDE HCL 5 MG/ML IJ SOLN
10.0000 mg | Freq: Once | INTRAMUSCULAR | Status: AC
  Administered 2021-03-07: 10 mg via INTRAVENOUS
  Filled 2021-03-07: qty 2

## 2021-03-07 MED ORDER — IOHEXOL 300 MG/ML  SOLN
100.0000 mL | Freq: Once | INTRAMUSCULAR | Status: AC | PRN
  Administered 2021-03-07: 100 mL via INTRAVENOUS

## 2021-03-07 MED ORDER — KETOROLAC TROMETHAMINE 30 MG/ML IJ SOLN
30.0000 mg | Freq: Once | INTRAMUSCULAR | Status: AC
  Administered 2021-03-07: 30 mg via INTRAVENOUS
  Filled 2021-03-07: qty 1

## 2021-03-07 MED ORDER — HEPARIN (PORCINE) 25000 UT/250ML-% IV SOLN
1300.0000 [IU]/h | INTRAVENOUS | Status: DC
  Administered 2021-03-07: 1150 [IU]/h via INTRAVENOUS
  Administered 2021-03-09 – 2021-03-10 (×2): 1300 [IU]/h via INTRAVENOUS
  Filled 2021-03-07 (×7): qty 250

## 2021-03-07 MED ORDER — HEPARIN BOLUS VIA INFUSION
4000.0000 [IU] | Freq: Once | INTRAVENOUS | Status: AC
  Administered 2021-03-07: 4000 [IU] via INTRAVENOUS

## 2021-03-07 MED ORDER — SODIUM CHLORIDE 0.9 % IV BOLUS
1000.0000 mL | Freq: Once | INTRAVENOUS | Status: AC
  Administered 2021-03-07: 1000 mL via INTRAVENOUS

## 2021-03-07 NOTE — Progress Notes (Signed)
Called by Dr Charm Barges @ [facility] ER re: this patients headache and abnormal CTV. Recommended heparin drip initiation. Draw hypercoag panel before gtt is started-labs ordered.. D/W Dr Charm Barges over the phone who will ensure lab draw prior to heparinization. Will see the patient in consult when she arrives at [facility].  -- Milon Dikes, MD Neurologist [facility] Pager: [phone]

## 2021-03-07 NOTE — ED Triage Notes (Signed)
Intermittent HA x 1.5 weeks with some blurred vision.  Recent hypertensive readings per pt.  Hx of migraines.  Also c/o L shoulder "spasms".

## 2021-03-07 NOTE — Consult Note (Signed)
Neurology Consultation  Reason for Consult: Headache Referring Physician: Dr. Meridee Score, ED provider from [facility]  CC: Headache, blurred vision  History is obtained from: Patient, chart  HPI: Jasmine [name] is a 45 y.o. female past medical history of anxiety, depression, PTSD, prior ectopic pregnancies, migraine in the past with last migrainous headache 3 to 4 years ago, presented to the emergency room for evaluation of headaches and blurred vision that started sometime Sunday evening and did not get better and continued to get worse. She describes the headache as a sense of pressure on her forehead as well as left temple and left back of the head along with some pain in her neck as well. She was evaluated by the ED provider.  A CT venogram was obtained that showed dural venous sinus thrombosis as described below. I was consulted over the phone.  I reviewed the images. I recommended that a hypercoagulable work-up be initiated and then she should be started on a heparin drip.  Denies any illness or sickness.  Denies any fevers chills.  Denies any nausea vomiting.  Denies any history of illicit drug use or tobacco use.  Denies any family history of clotting.  Denies any personal history of clotting.  Denies any history of miscarriages but has had 3 ectopic pregnancies.   ROS: Performed and negative except as noted in HPI  Past Medical History:  Diagnosis Date  . Anxiety   . Depression   . Ectopic pregnancy   . Migraine   . Osteoarthritis   . Plantar fasciitis   . PTSD (post-traumatic stress disorder)      Family History  Problem Relation Age of Onset  . Hypertension Mother   . Seizures Mother      Social History:   reports that she has never smoked. She has never used smokeless tobacco. She reports that she does not drink alcohol and does not use drugs.  Medications  Current Facility-Administered Medications:  .  heparin ADULT infusion 100 units/mL  (25000 units/227mL), 1,150 Units/hr, Intravenous, Continuous, Terrilee Files, MD, Last Rate: 11.5 mL/hr at 03/07/21 2056, 1,150 Units/hr at 03/07/21 2056   Exam: Current vital signs: BP (!) 142/95   Pulse 96   Temp 98.7 F (37.1 C)   Resp 18   Ht 5\' 2"  (1.575 m)   Wt 89 kg   SpO2 99%   BMI 35.87 kg/m  Vital signs in last 24 hours: Temp:  [98.7 F (37.1 C)-99.1 F (37.3 C)] 98.7 F (37.1 C) (03/24 2153) Pulse Rate:  [82-120] 96 (03/24 2153) Resp:  [16-20] 18 (03/24 2153) BP: (120-142)/(76-97) 142/95 (03/24 2153) SpO2:  [98 %-100 %] 99 % (03/24 2153) Weight:  [85.3 kg-89 kg] 89 kg (03/24 2026)  GENERAL: Awake, alert in NAD HEENT: - Normocephalic and atraumatic, dry mm, no LN++, no Thyromegally LUNGS - Clear to auscultation bilaterally with no wheezes CV - S1S2 RRR, no m/r/g, equal pulses bilaterally. ABDOMEN - Soft, nontender, nondistended with normoactive BS Ext: warm, well perfused, intact peripheral pulses with no edema  NEURO:  Mental Status: AA&Ox3  Language: speech is clear.  Naming, repetition, fluency, and comprehension intact. Cranial Nerves: PERRL, difficult to visualize fundi. EOMI, visual fields full, no facial asymmetry, facial sensation intact, hearing intact, tongue/uvula/soft palate midline, normal sternocleidomastoid and trapezius muscle strength. No evidence of tongue atrophy or fibrillations Motor: 5/5 without drift in all fours Tone: is normal and bulk is normal Sensation- Intact to light touch bilaterally Coordination:  FTN intact bilaterally, no ataxia in BLE. Gait- deferred  NIHSS-0   Labs I have reviewed labs in epic and the results pertinent to this consultation are:  CBC    Component Value Date/Time   WBC 5.8 03/07/2021 1819   RBC 3.99 03/07/2021 1819   HGB 11.9 (L) 03/07/2021 1819   HCT 36.4 03/07/2021 1819   PLT 264 03/07/2021 1819   MCV 91.2 03/07/2021 1819   MCH 29.8 03/07/2021 1819   MCHC 32.7 03/07/2021 1819   RDW 13.8  03/07/2021 1819   LYMPHSABS 1.6 03/07/2021 1819   MONOABS 0.5 03/07/2021 1819   EOSABS 0.2 03/07/2021 1819   BASOSABS 0.0 03/07/2021 1819    CMP     Component Value Date/Time   NA 136 03/07/2021 1819   K 3.9 03/07/2021 1819   CL 103 03/07/2021 1819   CO2 23 03/07/2021 1819   GLUCOSE 120 (H) 03/07/2021 1819   BUN 12 03/07/2021 1819   CREATININE 0.91 03/07/2021 1819   CALCIUM 8.5 (L) 03/07/2021 1819   PROT 7.2 03/04/2016 2018   ALBUMIN 3.7 03/04/2016 2018   AST 18 03/04/2016 2018   ALT 16 03/04/2016 2018   ALKPHOS 57 03/04/2016 2018   BILITOT 0.5 03/04/2016 2018   GFRNONAA >60 03/07/2021 1819   GFRAA >60 03/04/2016 2018    Lipid Panel     Component Value Date/Time   CHOL 160 03/29/2014 0635   TRIG 51 03/29/2014 0635   HDL 57 03/29/2014 0635   CHOLHDL 2.8 03/29/2014 0635   VLDL 10 03/29/2014 0635   LDLCALC 93 03/29/2014 0635     Imaging I have reviewed the images obtained:  CT-scan of the brain-no acute changes CT venogram of the head reveals near occlusive thrombus in the left transverse and sigmoid dural venous sinuses along with filling defect within the inferior aspect of the superior sagittal sinus and the confluence of sinuses compatible again with a subocclusive thrombus.   Assessment:  45 year old with above past medical history presenting for evaluation of headaches and blurred vision noted to have dural venous sinus thrombosis affecting the inferior aspect of superior sagittal sinus, confluence of sinuses, near occlusive thrombus in the left transverse sinus and sigmoid dural venous sinuses. Cause unclear-needs further work-up.  Impression: Dural venous sinus thrombosis  Recommendations: Hypercoagulable panel-ordered and collected at [facility] Heparin drip with bolus dose-discussed with pharmacy and ordered. Will need to transition to oral anticoagulation.  Defer to stroke team rounding in the morning. MRI of the brain We will continue to  follow.  -- Milon Dikes, MD Neurologist [facility] Pager: [phone]

## 2021-03-07 NOTE — Progress Notes (Signed)
ANTICOAGULATION CONSULT NOTE - Initial Consult  Pharmacy Consult for heparin Indication: central venous thrombosis brain  No Known Allergies  Patient Measurements: Height: 5\' 2"  (157.5 cm) Weight: 85.3 kg (188 lb) IBW/kg (Calculated) : 50.1 Heparin Dosing Weight: 69.4 kg  Vital Signs: Temp: 99.1 F (37.3 C) (03/24 1708) Temp Source: Oral (03/24 1708) BP: 129/84 (03/24 2000) Pulse Rate: 111 (03/24 2000)  Labs: Recent Labs    03/07/21 1819  HGB 11.9*  HCT 36.4  PLT 264  CREATININE 0.91    Estimated Creatinine Clearance: 77.5 mL/min (by C-G formula based on SCr of 0.91 mg/dL).   Medical History: Past Medical History:  Diagnosis Date  . Anxiety   . Depression   . Ectopic pregnancy   . Migraine   . Osteoarthritis   . Plantar fasciitis   . PTSD (post-traumatic stress disorder)     Assessment: Jasmine [name] admitted with headache. CT revealed central venous thrombosis without evidence of ICH. Pharmacy has been consulted to dose heparin.   No AC PTA. Hgb 11.9, PLTs WNL. Per discussion with Dr. 57, okay to bolus heparin.   Goal of Therapy:  Heparin level 0.3-0.7 units/ml, aim for lower end of goal given higher risk of bleed Monitor platelets by anticoagulation protocol: Yes   Plan:  Heparin 4000 units IV x1, followed by 1150 units/hr Heparin level in 6 hours Daily heparin level, CBC  Wilford Corner, PharmD PGY1 Acute Care Pharmacy Resident 03/07/2021 8:05 PM  Please check [email or web address] for unit specific pharmacy phone numbers.

## 2021-03-07 NOTE — ED Provider Notes (Signed)
[facility] EMERGENCY DEPARTMENT Provider Note   CSN: [id number] Arrival date & time: 03/07/21  1704     History Chief Complaint  Patient presents with  . Headache    Jasmine [name] is a 45 y.o. female.  She is here with a complaint of a left temporal headache that is radiating down into her neck and upper left back that started a week and a half ago.  She went to urgent care and found that her blood pressure was elevated.  They gave her some medications with improvement.  Her headache recurred again today.  Not associated with any photophobia or nausea which are more typical of her migraines.  She has not had a migraine in a few years.  No trauma.  She has noticed some blurry vision out of her left eye.  Not on any oral contraceptives.  The history is provided by the patient.  Headache Pain location:  L temporal Quality:  Dull Radiates to:  L neck Severity currently:  9/10 Severity at highest:  9/10 Onset quality:  Gradual Duration:  11 days Timing:  Intermittent Progression:  Unchanged Chronicity:  New Similar to prior headaches: no   Relieved by:  Nothing Worsened by:  Nothing Ineffective treatments:  Prescription medications Associated symptoms: blurred vision and neck pain   Associated symptoms: no abdominal pain, no back pain, no cough, no diarrhea, no dizziness, no ear pain, no eye pain, no facial pain, no fever, no focal weakness, no hearing loss, no loss of balance, no nausea, no numbness and no photophobia        Past Medical History:  Diagnosis Date  . Anxiety   . Depression   . Ectopic pregnancy   . Migraine   . Osteoarthritis   . Plantar fasciitis   . PTSD (post-traumatic stress disorder)     Patient Active Problem List   Diagnosis Date Noted  . MDD (major depressive disorder), recurrent episode, moderate (HCC) 03/05/2016  . MDD (major depressive disorder) 03/28/2014    Past Surgical History:  Procedure Laterality Date  . ECTOPIC  PREGNANCY SURGERY  10/2000  . ECTOPIC PREGNANCY SURGERY  01/2004     OB History   No obstetric history on file.     Family History  Problem Relation Age of Onset  . Hypertension Mother   . Seizures Mother     Social History   Tobacco Use  . Smoking status: Never Smoker  . Smokeless tobacco: Never Used  Vaping Use  . Vaping Use: Never used  Substance Use Topics  . Alcohol use: No  . Drug use: No    Home Medications Prior to Admission medications   Medication Sig Start Date End Date Taking? Authorizing Provider  buPROPion (WELLBUTRIN XL) 300 MG 24 hr tablet Take 300 mg by mouth daily.    [provider]  escitalopram (LEXAPRO) 20 MG tablet Take 1 tablet (20 mg total) by mouth daily. 03/10/16   Adonis Brook, NP  lurasidone (LATUDA) 40 MG TABS tablet Take 20 mg by mouth daily with breakfast.    [provider]  prazosin (MINIPRESS) 1 MG capsule Take 1 capsule (1 mg total) by mouth at bedtime. 03/10/16   Adonis Brook, NP  traZODone (DESYREL) 50 MG tablet Take 1 tablet (50 mg total) by mouth at bedtime and may repeat dose one time if needed. 03/10/16   Adonis Brook, NP    Allergies    Patient has no known allergies.  Review of Systems  Review of Systems  Constitutional: Negative for fever.  HENT: Negative for ear pain and hearing loss.   Eyes: Positive for blurred vision and visual disturbance. Negative for photophobia and pain.  Respiratory: Negative for cough.   Cardiovascular: Negative for chest pain.  Gastrointestinal: Negative for abdominal pain, diarrhea and nausea.  Genitourinary: Negative for dysuria.  Musculoskeletal: Positive for neck pain. Negative for back pain.  Skin: Negative for rash.  Neurological: Positive for headaches. Negative for dizziness, focal weakness, numbness and loss of balance.    Physical Exam Updated Vital Signs BP (!) 135/97 (BP Location: Left Arm)   Pulse (!) 120   Temp 99.1 F (37.3 C) (Oral)   Resp 20    Ht 5\' 2"  (1.575 m)   Wt 85.3 kg   SpO2 99%   BMI 34.39 kg/m   Physical Exam Vitals and nursing note reviewed.  Constitutional:      General: She is not in acute distress.    Appearance: She is well-developed.  HENT:     Head: Normocephalic and atraumatic.  Eyes:     Conjunctiva/sclera: Conjunctivae normal.  Cardiovascular:     Rate and Rhythm: Normal rate and regular rhythm.     Heart sounds: No murmur heard.   Pulmonary:     Effort: Pulmonary effort is normal. No respiratory distress.     Breath sounds: Normal breath sounds.  Abdominal:     Palpations: Abdomen is soft.     Tenderness: There is no abdominal tenderness.  Musculoskeletal:     Cervical back: Neck supple.  Skin:    General: Skin is warm and dry.  Neurological:     Mental Status: She is alert and oriented to person, place, and time.     GCS: GCS eye subscore is 4. GCS verbal subscore is 5. GCS motor subscore is 6.     Cranial Nerves: No cranial nerve deficit, dysarthria or facial asymmetry.     Sensory: No sensory deficit.     Motor: No weakness.     Gait: Gait normal.     ED Results / Procedures / Treatments   Labs (all labs ordered are listed, but only abnormal results are displayed) Labs Reviewed  BASIC METABOLIC PANEL - Abnormal; Notable for the following components:      Result Value   Glucose, Bld 120 (*)    Calcium 8.5 (*)    All other components within normal limits  CBC WITH DIFFERENTIAL/PLATELET - Abnormal; Notable for the following components:   Hemoglobin 11.9 (*)    All other components within normal limits  URINALYSIS, ROUTINE W REFLEX MICROSCOPIC - Abnormal; Notable for the following components:   Specific Gravity, Urine <1.005 (*)    All other components within normal limits  BASIC METABOLIC PANEL - Abnormal; Notable for the following components:   CO2 21 (*)    Glucose, Bld 102 (*)    Calcium 8.0 (*)    All other components within normal limits  CBC - Abnormal; Notable for the  following components:   RBC 3.72 (*)    Hemoglobin 11.0 (*)    HCT 33.9 (*)    All other components within normal limits  RESP PANEL BY RT-PCR (FLU A&B, COVID) ARPGX2  MRSA PCR SCREENING  PREGNANCY, URINE  ANTITHROMBIN III  HEPARIN LEVEL (UNFRACTIONATED)  PROTEIN C ACTIVITY  PROTEIN C, TOTAL  PROTEIN S ACTIVITY  PROTEIN S, TOTAL  LUPUS ANTICOAGULANT PANEL  BETA-2-GLYCOPROTEIN I ABS, IGG/M/A  HOMOCYSTEINE  FACTOR 5 LEIDEN  PROTHROMBIN GENE MUTATION  CARDIOLIPIN ANTIBODIES, IGG, IGM, IGA  HIV ANTIBODY (ROUTINE TESTING W REFLEX)  HEPARIN LEVEL (UNFRACTIONATED)    EKG None  Radiology CT VENOGRAM HEAD  Result Date: 03/07/2021 CLINICAL DATA:  Dural venous sinus thrombosis suspected; atypical headache, blurry vision. Additional history provided: Patient reports intermittent headache for 1 and half weeks with some blurred vision, recent hypertensive readings, history of migraines. EXAM: CT VENOGRAM HEAD TECHNIQUE: Contiguous axial images were obtained from the base of the skull through the vertex without intravenous contrast. Subsequently, CT venography of the head was performed following bolus administration of 100 mL Omnipaque 300 intravenous contrast. Coronal and sagittal thin reconstructions were submitted. Axial, coronal and sagittal thick MIP reconstructions were also submitted. CONTRAST:  100mL OMNIPAQUE IOHEXOL 300 MG/ML  SOLN COMPARISON:  Prior head CT examination 03/04/2016. FINDINGS: CT Head: Brain: Cerebral volume is normal. There is no acute intracranial hemorrhage. No demarcated cortical infarct. No extra-axial fluid collection. No evidence of intracranial mass. No midline shift. Vascular: No hyperdense vessel. Skull: Normal. Negative for fracture or focal lesion. Sinuses/Orbits: Visualized orbits show no acute finding. No significant paranasal sinus disease at the imaged levels. CT venogram head: Filling defect within the inferior aspect of the superior sagittal sinus and  confluence of sinuses compatible with subocclusive thrombus. Filling defect throughout the left transverse and sigmoid dural venous sinuses compatible with near occlusive thrombus. There appears to be some enhancement within the upper left internal jugular vein. Elsewhere, the superior sagittal sinus is patent. The internal cerebral veins, vein of Galen, straight sinus, right transverse sinus and right sigmoid sinus appear patent. Enhancement is seen within the visualized upper right internal jugular vein. These results were called by telephone at the time of interpretation on 03/07/2021 at 7:41 pm to [facility] Chris[facility] Pustejovsky , who verbally acknowledged these results. IMPRESSION: 1. Subocclusive thrombus within the inferior aspect of the superior sagittal sinus and confluence of sinuses. 2. Near occlusive thrombus throughout the left transverse and sigmoid dural venous sinuses. Some enhancement is seen within the visualized upper left internal jugular vein. 3. No evidence of acute intracranial hemorrhage or acute infarct. Electronically Signed   By: Jackey LogeKyle  Golden DO   On: 03/07/2021 19:41    Procedures .Critical Care Performed by: Terrilee FilesButler, Ceriah Kohler C, MD Authorized by: Terrilee FilesButler, Cloey Sferrazza C, MD   Critical care provider statement:    Critical care time (minutes):  45   Critical care time was exclusive of:  Separately billable procedures and treating other patients   Critical care was necessary to treat or prevent imminent or life-threatening deterioration of the following conditions:  CNS failure or compromise   Critical care was time spent personally by me on the following activities:  Discussions with consultants, evaluation of patient's response to treatment, examination of patient, ordering and performing treatments and interventions, ordering and review of laboratory studies, ordering and review of radiographic studies, pulse oximetry, re-evaluation of patient's condition, obtaining history from patient or  surrogate, review of old charts and development of treatment plan with patient or surrogate     Medications Ordered in ED Medications  heparin ADULT infusion 100 units/mL (25000 units/28250mL) (1,150 Units/hr Intravenous Handoff 03/08/21 0716)  prochlorperazine (COMPAZINE) injection 10 mg (has no administration in time range)  acetaminophen (TYLENOL) tablet 650 mg (650 mg Oral Given 03/08/21 0930)  ibuprofen (ADVIL) tablet 600 mg (has no administration in time range)  ondansetron (ZOFRAN) injection 4 mg (has no administration in time range)  escitalopram (LEXAPRO) tablet 5 mg (5  mg Oral Given 03/08/21 0920)  sodium chloride 0.9 % bolus 1,000 mL (0 mLs Intravenous Stopped 03/07/21 2058)  ketorolac (TORADOL) 30 MG/ML injection 30 mg (30 mg Intravenous Given 03/07/21 1754)  metoCLOPramide (REGLAN) injection 10 mg (10 mg Intravenous Given 03/07/21 1754)  diphenhydrAMINE (BENADRYL) injection 12.5 mg (12.5 mg Intravenous Given 03/07/21 1754)  iohexol (OMNIPAQUE) 300 MG/ML solution 100 mL (100 mLs Intravenous Contrast Given 03/07/21 1906)  heparin bolus via infusion 4,000 Units (4,000 Units Intravenous Bolus from Bag 03/07/21 2056)    ED Course  I have reviewed the triage vital signs and the nursing notes.  Pertinent labs & imaging results that were available during my care of the patient were reviewed by me and considered in my medical decision making (see chart for details).  Clinical Course as of 03/08/21 0949  Thu Mar 07, 2021  1941 Received a call from radiology.  Patient has acute CVT. [MB]  2021 Discussed with Dr. Wilford Corner who reviewed her imaging.  He recommends getting a hyper coag work-up on the patient, starting her on heparin, and admitting her to [facility] on a progressive bed.  Discussed with Dr. Antionette Char [facility] who will put the patient in for a bed.  I had extensive conversations with the patient and her mother and explained to the best of my ability results of her testing and the  expected plan for her admission. [MB]    Clinical Course User Index [MB] Terrilee Files, MD   MDM Rules/Calculators/A&P                         This patient complains of left-sided headache radiating into her neck left high blurry vision; this involves an extensive number of treatment Options and is a complaint that carries with it a high risk of complications and Morbidity. The differential includes migraine headache, tension headache, bleed, stroke, CVT, dissection  I ordered, reviewed and interpreted labs, which included CBC with normal white count, hemoglobin slightly lower than baseline, chemistries fairly normal other than mildly elevated glucose, urinalysis negative, pregnancy test negative, Covid testing negative I ordered medication migraine cocktail which included Benadryl Toradol Reglan IV fluids with improvement in her headache.  IV bolus and heparin I ordered imaging studies which included CT head and CT venogram and I independently    visualized and interpreted imaging which showed acute CVT Additional history obtained from patient's mother Previous records obtained and reviewed in epic, no recent admissions I consulted Dr. Wilford Corner neuro hospitalist and Dr. Antionette Char [facility] and discussed lab and imaging findings  Critical Interventions: Identification management of acute CVT  After the interventions stated above, I reevaluated the patient and found symptomatically improved.  She is agreeable to admission for continued anticoagulation and further work-up   Final Clinical Impression(s) / ED Diagnoses Final diagnoses:  Dural venous sinus thrombosis    Rx / DC Orders ED Discharge Orders    None       Terrilee Files, MD 03/08/21 [phone]

## 2021-03-07 NOTE — ED Notes (Signed)
Multiple attempts made by michael rn and gage rn for ultrasound iv access, unsuccessful at this time. Blood specimens obtained for ordered labs

## 2021-03-08 ENCOUNTER — Inpatient Hospital Stay (HOSPITAL_COMMUNITY)

## 2021-03-08 DIAGNOSIS — F419 Anxiety disorder, unspecified: Secondary | ICD-10-CM | POA: Diagnosis present

## 2021-03-08 DIAGNOSIS — I6389 Other cerebral infarction: Secondary | ICD-10-CM

## 2021-03-08 LAB — CBC
HCT: 33.9 % — ABNORMAL LOW (ref 36.0–46.0)
Hemoglobin: 11 g/dL — ABNORMAL LOW (ref 12.0–15.0)
MCH: 29.6 pg (ref 26.0–34.0)
MCHC: 32.4 g/dL (ref 30.0–36.0)
MCV: 91.1 fL (ref 80.0–100.0)
Platelets: 254 10*3/uL (ref 150–400)
RBC: 3.72 MIL/uL — ABNORMAL LOW (ref 3.87–5.11)
RDW: 13.7 % (ref 11.5–15.5)
WBC: 6.6 10*3/uL (ref 4.0–10.5)
nRBC: 0 % (ref 0.0–0.2)

## 2021-03-08 LAB — BASIC METABOLIC PANEL
Anion gap: 8 (ref 5–15)
BUN: 8 mg/dL (ref 6–20)
CO2: 21 mmol/L — ABNORMAL LOW (ref 22–32)
Calcium: 8 mg/dL — ABNORMAL LOW (ref 8.9–10.3)
Chloride: 107 mmol/L (ref 98–111)
Creatinine, Ser: 0.76 mg/dL (ref 0.44–1.00)
GFR, Estimated: >60 mL/min (ref >60)
Glucose, Bld: 102 mg/dL — ABNORMAL HIGH (ref 70–99)
Potassium: 3.6 mmol/L (ref 3.5–5.1)
Sodium: 136 mmol/L (ref 135–145)

## 2021-03-08 LAB — ECHOCARDIOGRAM COMPLETE
Area-P 1/2: 5.38 cm2
Height: 62 in
S' Lateral: 3.1 cm
Weight: 3137.6 oz

## 2021-03-08 LAB — LIPID PANEL
Cholesterol: 168 mg/dL (ref 0–200)
HDL: 45 mg/dL (ref >40)
LDL Cholesterol: 111 mg/dL — ABNORMAL HIGH (ref 0–99)
Total CHOL/HDL Ratio: 3.7 RATIO
Triglycerides: 61 mg/dL (ref <150)
VLDL: 12 mg/dL (ref 0–40)

## 2021-03-08 LAB — HEPARIN LEVEL (UNFRACTIONATED)
Heparin Unfractionated: 0.42 IU/mL (ref 0.30–0.70)
Heparin Unfractionated: 0.42 IU/mL (ref 0.30–0.70)

## 2021-03-08 LAB — ANTITHROMBIN III: AntiThromb III Func: 81 % (ref 75–120)

## 2021-03-08 LAB — HEMOGLOBIN A1C
Hgb A1c MFr Bld: 6 % — ABNORMAL HIGH (ref 4.8–5.6)
Mean Plasma Glucose: 125.5 mg/dL

## 2021-03-08 LAB — MRSA PCR SCREENING: MRSA by PCR: NEGATIVE

## 2021-03-08 LAB — HIV ANTIBODY (ROUTINE TESTING W REFLEX): HIV Screen 4th Generation wRfx: NONREACTIVE

## 2021-03-08 IMAGING — MR MR HEAD WO/W CM
9 of 14 series · 28 of 48 positions shown · IV contrast (gadavist)
Comparison: Prior CT from [DATE].

CLINICAL DATA: Initial evaluation for intermittent headaches with
blurry vision for 1.5 weeks. History of migraines.

EXAM:
MRI HEAD WITHOUT AND WITH CONTRAST
MRA HEAD WITHOUT CONTRAST
MRA NECK WITHOUT AND WITH CONTRAST
TECHNIQUE: Multiplanar, multiecho pulse sequences of the brain and surrounding
structures were obtained without and with intravenous contrast.
Angiographic images of the Circle of Willis were obtained using MRA
technique without intravenous contrast. Angiographic images of the
neck were obtained using MRA technique without and with intravenous
contrast. Carotid stenosis measurements (when applicable) are
obtained utilizing NASCET criteria, using the distal internal
carotid diameter as the denominator.
CONTRAST:  10mL GADAVIST GADOBUTROL 1 MMOL/ML IV SOLN

[Series 3: DWI · axial · 3.0mm · 1.09mm/px · z∈[-110,+28]mm · 6 of 100 slices shown (1 of 4)]
[im 1/100]
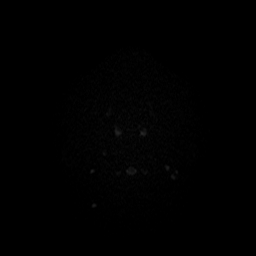
[im 20/100]
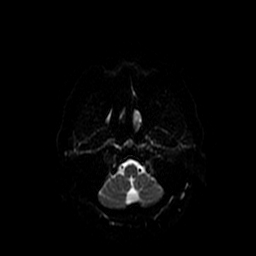
[im 40/100]
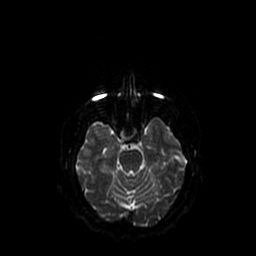
[im 60/100]
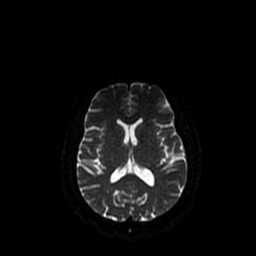
[im 80/100]
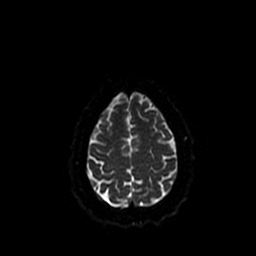
[im 100/100]
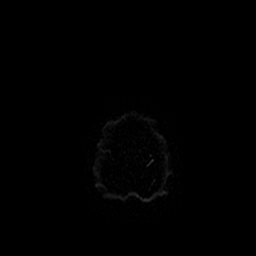

[Series 4: DWI · coronal · 5.0mm · 1.09mm/px · 4 of 70 slices shown (2 of 4)]
[im 1/70]
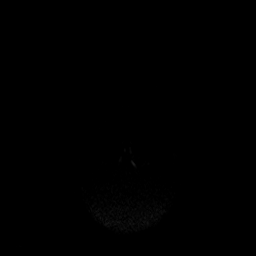
[im 24/70]
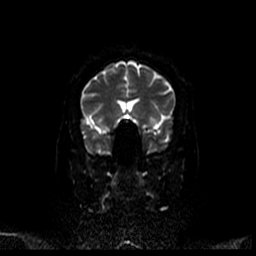
[im 47/70]
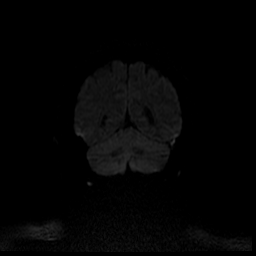
[im 70/70]
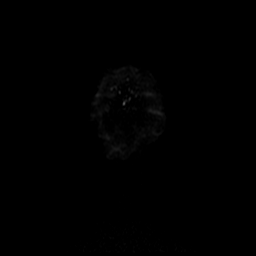

[Series 6: FLAIR · axial · 3.0mm · 0.43mm/px · z∈[-85,+50]mm · 2 of 25 slices shown]
[im 1/25]
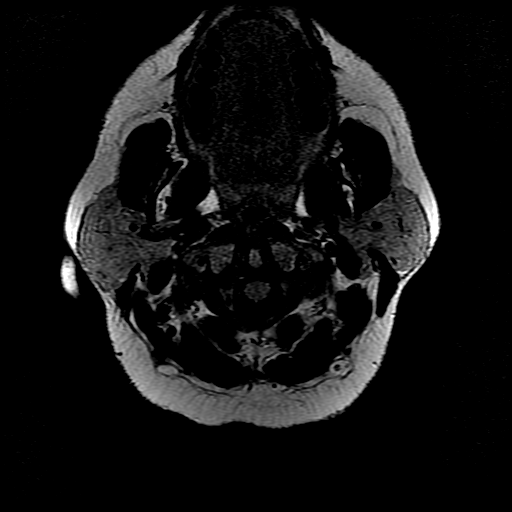
[im 25/25]
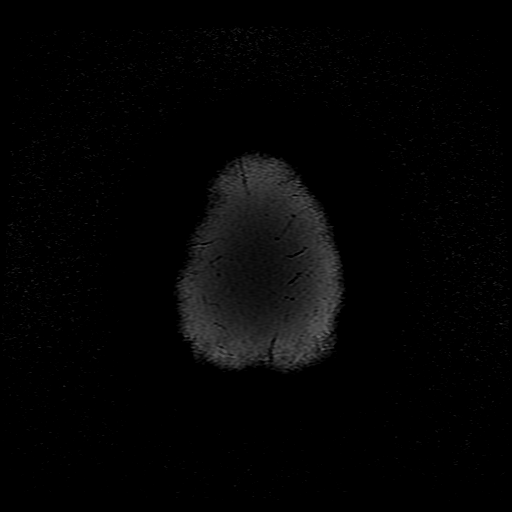

[Series 9: T2 · axial · 5.0mm · 0.43mm/px · 1 of 25 slices shown]
[im 1/25]
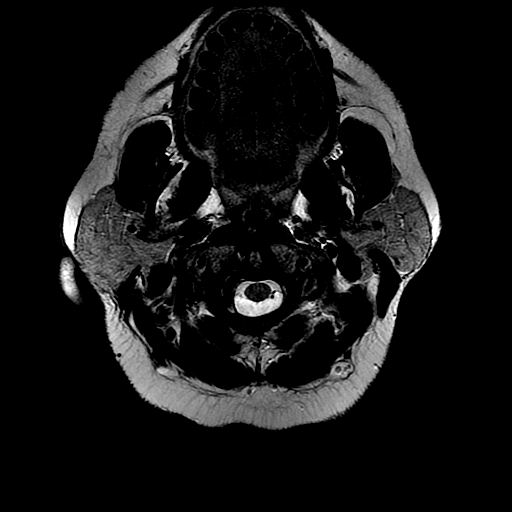

[Series 17: T1 post-contrast · axial · 3.0mm · 0.47mm/px · z∈[-90,+48]mm · 4 of 50 slices shown (1 of 3)]
[im 1/50]
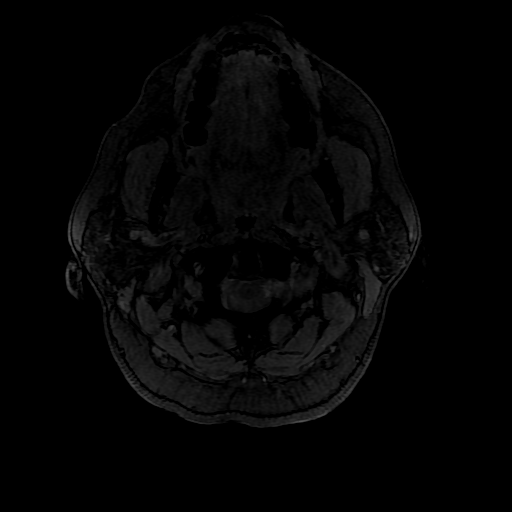
[im 17/50]
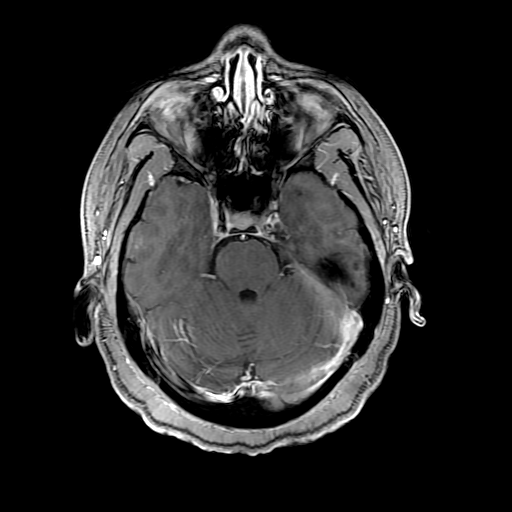
[im 33/50]
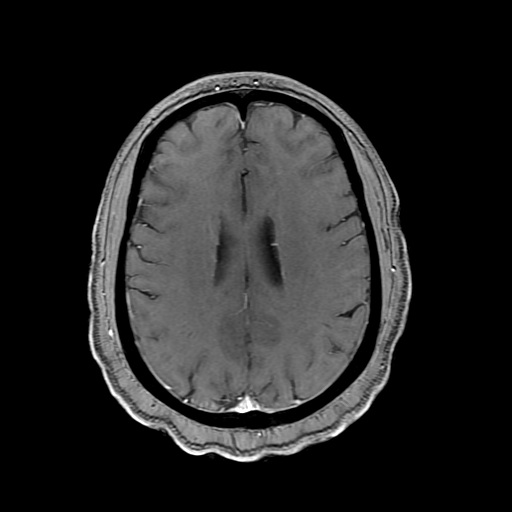
[im 50/50]
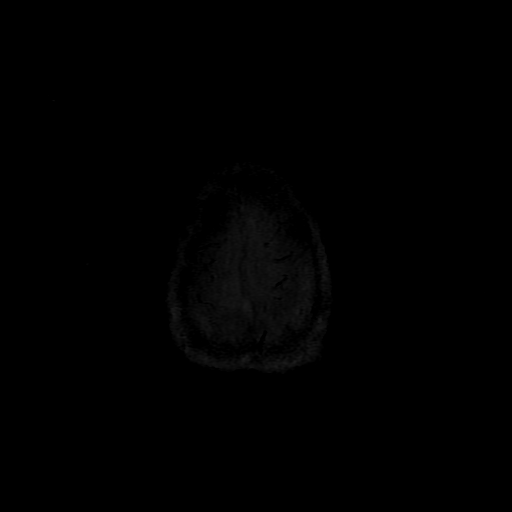

[Series 18: T1 post-contrast · coronal · 5.0mm · 0.39mm/px · 2 of 27 slices shown (2 of 3)]
[im 1/27]
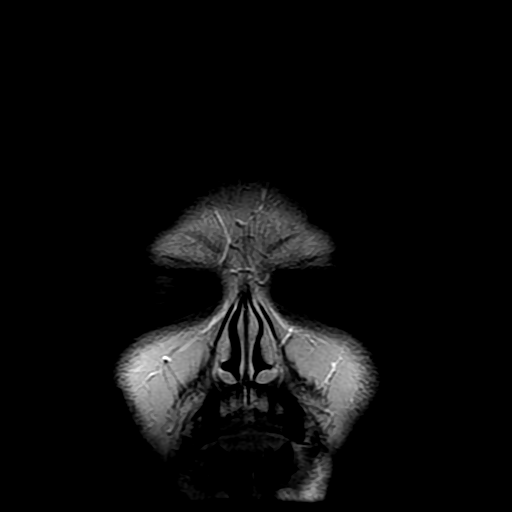
[im 27/27]
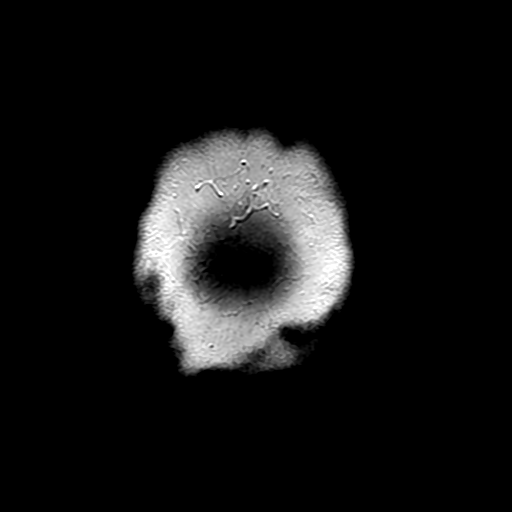

[Series 19: T1 post-contrast · sagittal · 5.0mm · 0.47mm/px · 2 of 25 slices shown (3 of 3)]
[im 1/25]
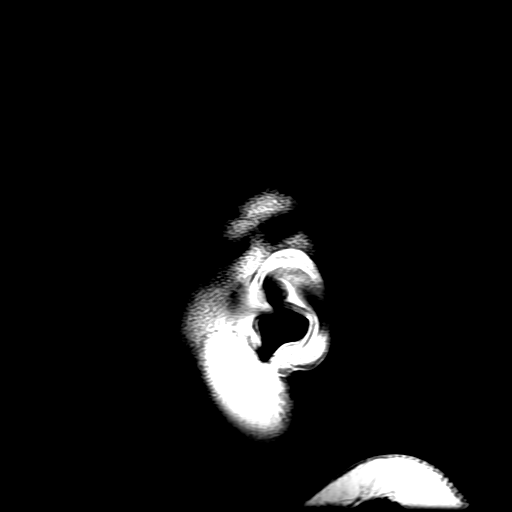
[im 25/25]
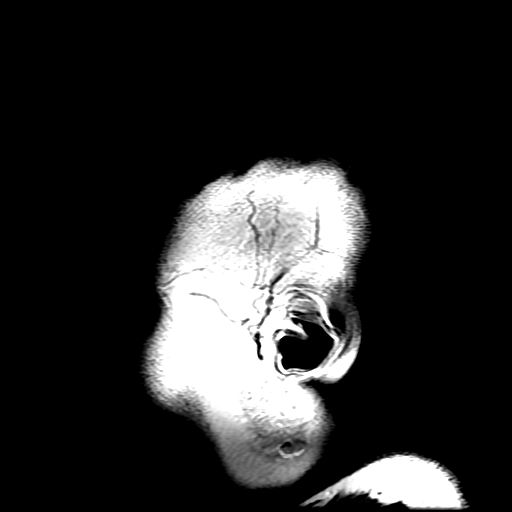

[Series 300: DWI · axial · 3.0mm · 1.09mm/px · z∈[-110,+28]mm · 4 of 50 slices shown (3 of 4)]
[im 1/50]
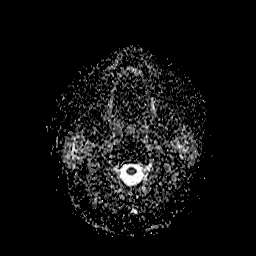
[im 17/50]
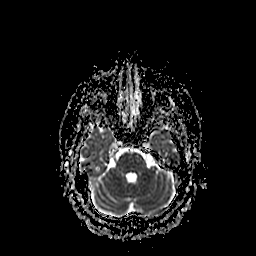
[im 33/50]
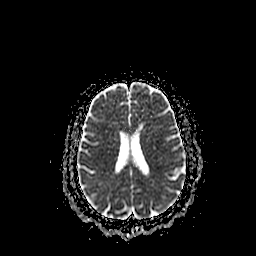
[im 50/50]
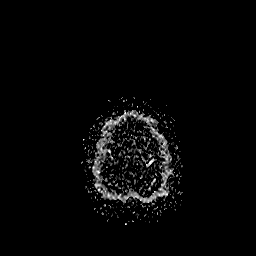

[Series 400: DWI · coronal · 5.0mm · 1.09mm/px · 3 of 35 slices shown (4 of 4)]
[im 1/35]
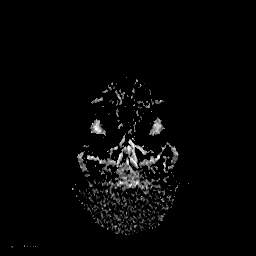
[im 18/35]
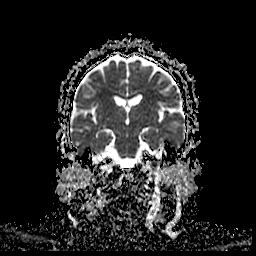
[im 35/35]
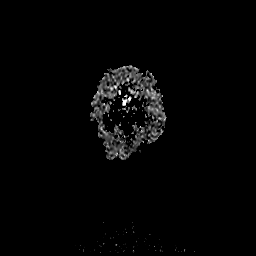

[28 of 48 positions shown; findings below may reference images not displayed]

FINDINGS: MRI HEAD FINDINGS

Brain: Cerebral volume within normal limits for patient age. No
focal parenchymal signal abnormality identified.

No abnormal foci of restricted diffusion to suggest acute or
subacute ischemia. Gray-white matter differentiation well
maintained. No encephalomalacia to suggest chronic cortical
infarction. No evidence for acute or chronic intracranial
hemorrhage.

No mass lesion, midline shift or mass effect. No hydrocephalus or
extra-axial fluid collection. Pituitary gland suprasellar region
normal. Midline structures intact.

No abnormal enhancement within the brain itself.

Vascular: Abnormal flow void within the posterosuperior sagittal
sinus near the torcula, extending into the left transverse and
sigmoid sinuses, consistent with history of acute dural sinus
thrombosis. Suspected associated small cortical vein thrombosis
noted as well on gradient echo sequence (series 7, image 8).
Associated reactive dural and leptomeningeal enhancement within this
region. No associated gyral swelling or edema. No evidence for acute
venous infarct or hemorrhage. Remainder of the major dural sinuses
demonstrate normal flow voids. Major intracranial arterial flow
voids are well maintained.

Skull and upper cervical spine: Craniocervical junction within
normal limits. Visualized upper cervical spine within normal limits.
Bone marrow signal intensity normal. No focal marrow replacing
lesion. No scalp soft tissue abnormality.

Sinuses/Orbits: Globes and orbital soft tissues within normal
limits. Paranasal sinuses are clear. No mastoid effusion. Inner ear
structures grossly normal.

Other: None.

MRA HEAD FINDINGS

ANTERIOR CIRCULATION:

Both internal carotid arteries widely patent to the termini without
stenosis. A1 segments widely patent. Normal anterior communicating
artery complex. Both anterior cerebral arteries widely patent to
their distal aspects without stenosis. No M1 stenosis or occlusion.
Normal MCA bifurcations. Distal MCA branches well perfused and
symmetric.

POSTERIOR CIRCULATION:

Both V4 segments patent to the vertebrobasilar junction without
stenosis. Both PICA origins patent and normal. Basilar widely patent
to its distal aspect without stenosis. Superior cerebellar arteries
patent bilaterally. Both PCAs primarily supplied via the basilar and
are well perfused to there distal aspects.

No intracranial aneurysm or other vascular abnormality.

MRA NECK FINDINGS

AORTIC ARCH: Visualized aortic arch normal in caliber with normal 3
vessel morphology. No hemodynamically significant stenosis about the
origin of the great vessels. Visualized subclavian arteries widely
patent.

RIGHT CAROTID SYSTEM: Right common and internal carotid arteries
widely patent without stenosis, evidence for dissection or
occlusion.

LEFT CAROTID SYSTEM: Left common and internal carotid arteries
widely patent without stenosis, evidence for dissection or
occlusion.

VERTEBRAL ARTERIES: Both vertebral arteries arise from the
subclavian arteries. No proximal subclavian artery stenosis. Both
vertebral arteries widely patent without stenosis, evidence for
dissection or occlusion.
IMPRESSION: 1. Abnormal flow void within the posterosuperior sagittal sinus,
extending into the left transverse and sigmoid sinuses, consistent
with previously identified acute dural sinus thrombosis. No
associated venous infarct, hemorrhage, or other complication.
2. Otherwise normal brain MRI.
3. Normal intracranial MRA.
4. Normal MRA of the neck.

## 2021-03-08 IMAGING — MR MR MRA NECK WO/W CM
4 of 7 series · 16 of 48 positions shown · IV contrast (gadavist)
Comparison: Prior CT from [DATE].

CLINICAL DATA: Initial evaluation for intermittent headaches with
blurry vision for 1.5 weeks. History of migraines.

EXAM:
MRI HEAD WITHOUT AND WITH CONTRAST
MRA HEAD WITHOUT CONTRAST
MRA NECK WITHOUT AND WITH CONTRAST
TECHNIQUE: Multiplanar, multiecho pulse sequences of the brain and surrounding
structures were obtained without and with intravenous contrast.
Angiographic images of the Circle of Willis were obtained using MRA
technique without intravenous contrast. Angiographic images of the
neck were obtained using MRA technique without and with intravenous
contrast. Carotid stenosis measurements (when applicable) are
obtained utilizing NASCET criteria, using the distal internal
carotid diameter as the denominator.
CONTRAST:  10mL GADAVIST GADOBUTROL 1 MMOL/ML IV SOLN

[Series 13: ax (id) carotid · axial · 2.8mm · 0.47mm/px · z∈[-205,-105]mm · 5 of 80 slices shown]
[im 1/80]
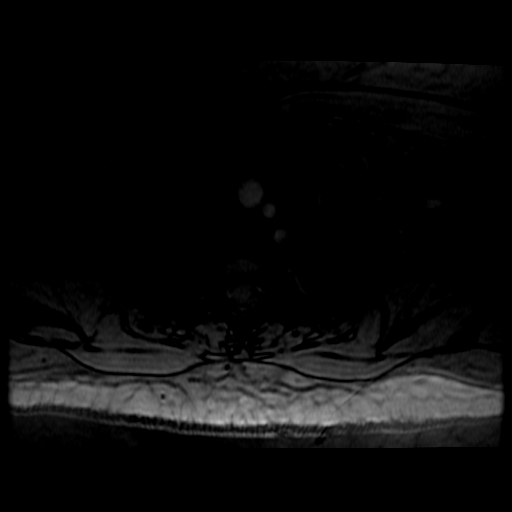
[im 20/80]
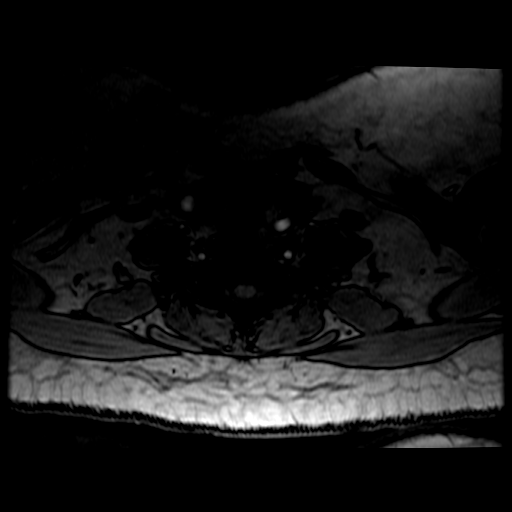
[im 40/80]
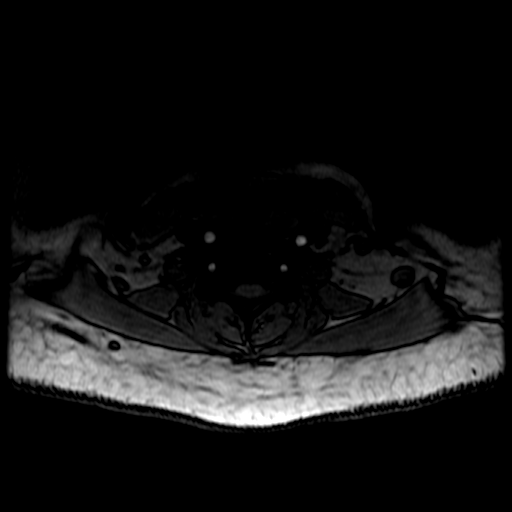
[im 60/80]
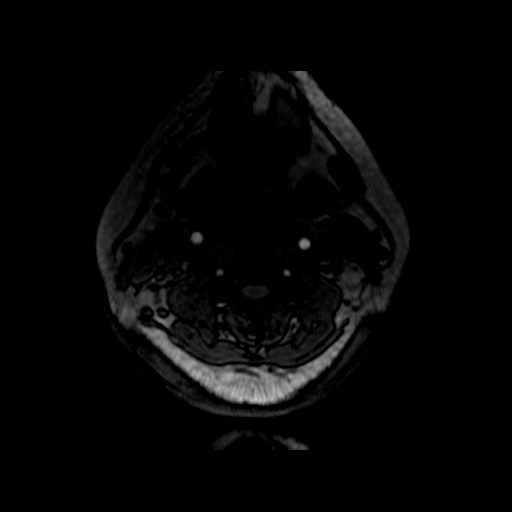
[im 80/80]
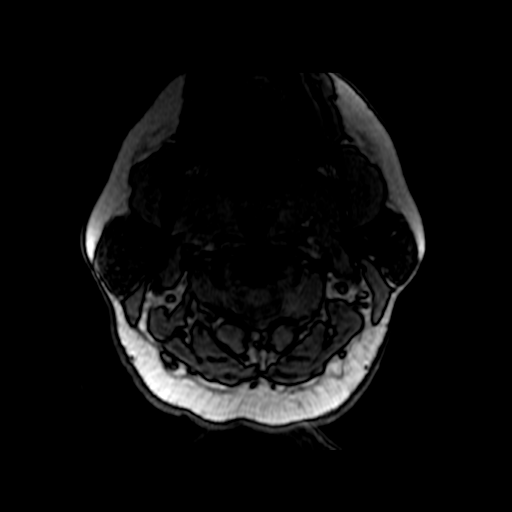

[Series 14: opt sag inhance · sagittal · 1.6mm · 0.66mm/px · 5 of 400 slices shown]
[im 19/400]
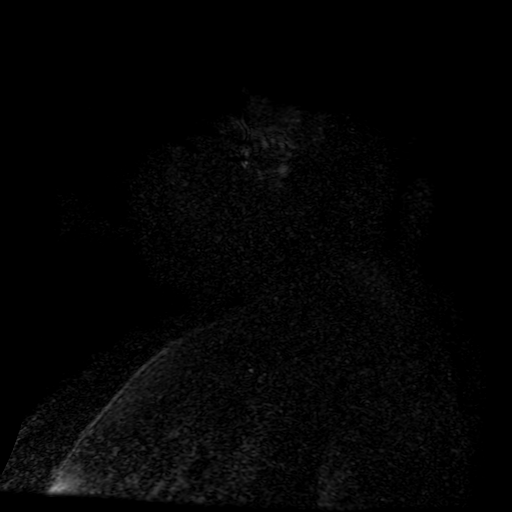
[im 55/400]
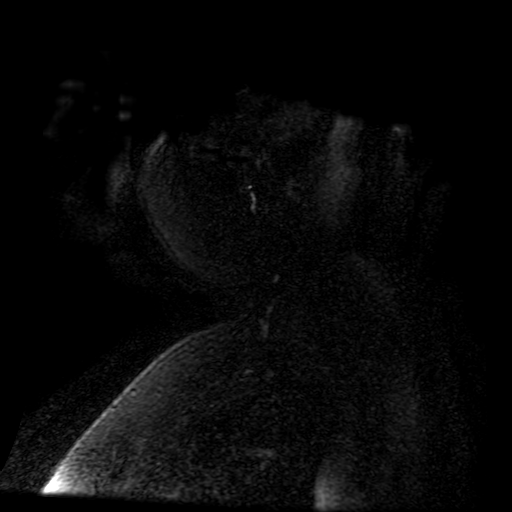
[im 73/400]
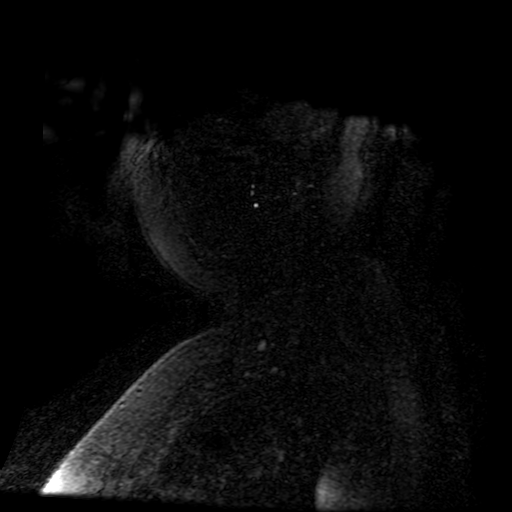
[im 200/400]
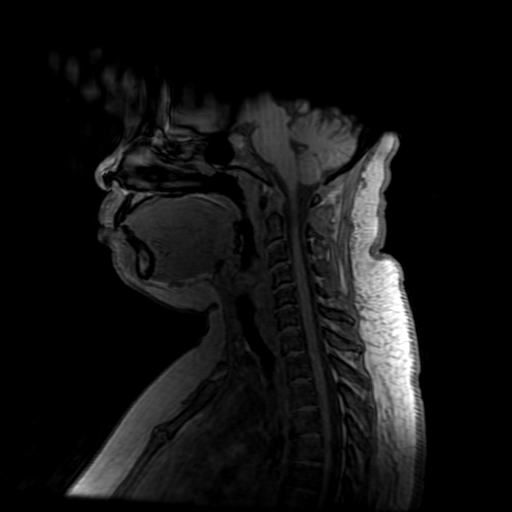
[im 345/400]
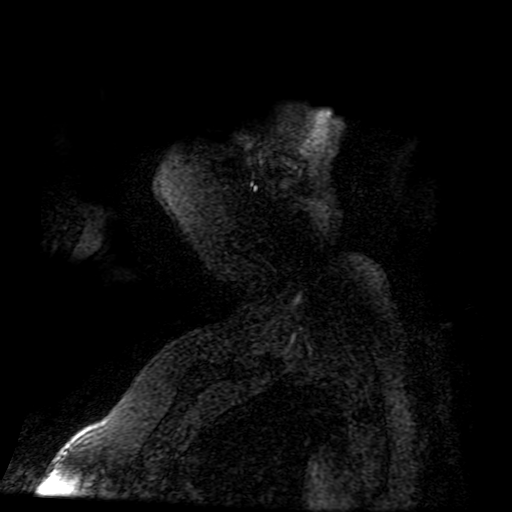

[Series 1500: cor cemra ft · coronal · 1.4mm · 0.59mm/px · 3 of 80 slices shown]
[im 1/80]
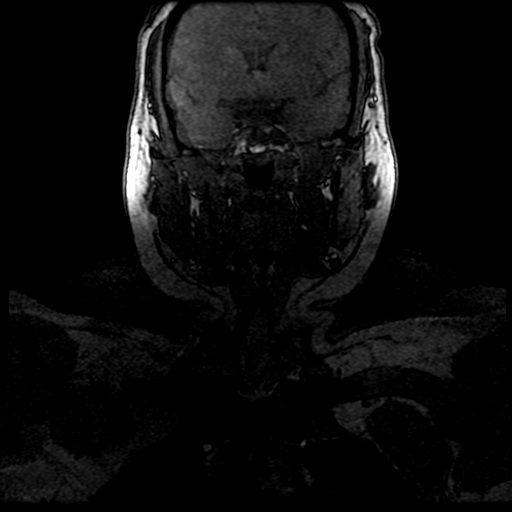
[im 53/80]
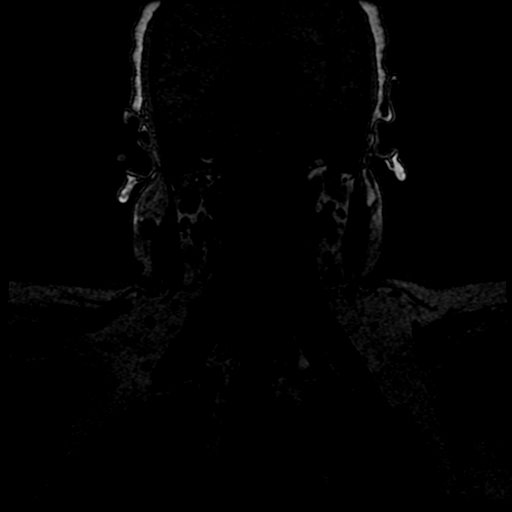
[im 80/80]
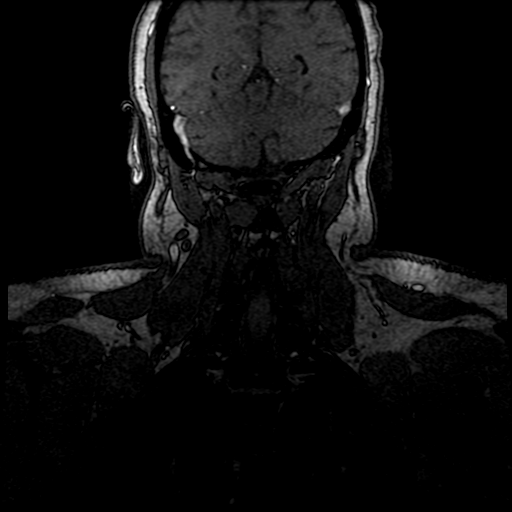

[Series 1501: ph1/cor cemra ft · coronal · 1.4mm · 0.59mm/px · 3 of 80 slices shown]
[im 1/80]
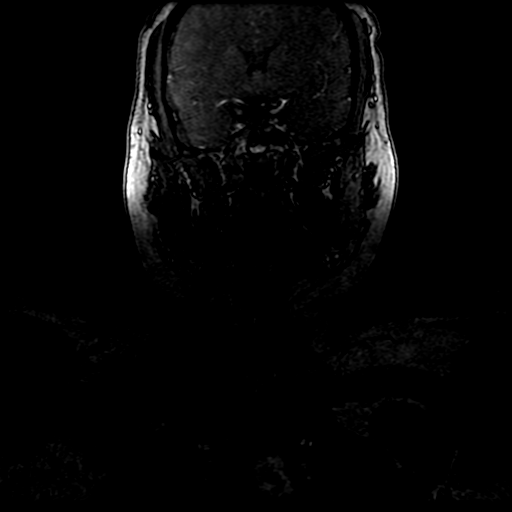
[im 53/80]
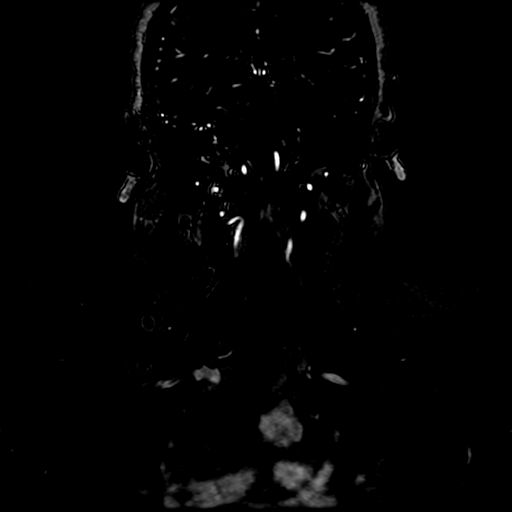
[im 80/80]
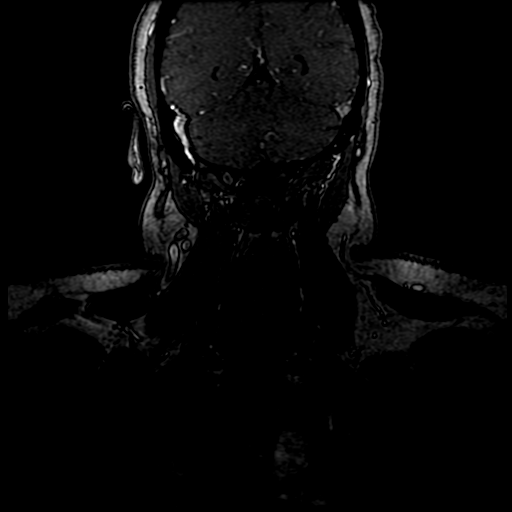

[16 of 48 positions shown; findings below may reference images not displayed]

FINDINGS: MRI HEAD FINDINGS

Brain: Cerebral volume within normal limits for patient age. No
focal parenchymal signal abnormality identified.

No abnormal foci of restricted diffusion to suggest acute or
subacute ischemia. Gray-white matter differentiation well
maintained. No encephalomalacia to suggest chronic cortical
infarction. No evidence for acute or chronic intracranial
hemorrhage.

No mass lesion, midline shift or mass effect. No hydrocephalus or
extra-axial fluid collection. Pituitary gland suprasellar region
normal. Midline structures intact.

No abnormal enhancement within the brain itself.

Vascular: Abnormal flow void within the posterosuperior sagittal
sinus near the torcula, extending into the left transverse and
sigmoid sinuses, consistent with history of acute dural sinus
thrombosis. Suspected associated small cortical vein thrombosis
noted as well on gradient echo sequence (series 7, image 8).
Associated reactive dural and leptomeningeal enhancement within this
region. No associated gyral swelling or edema. No evidence for acute
venous infarct or hemorrhage. Remainder of the major dural sinuses
demonstrate normal flow voids. Major intracranial arterial flow
voids are well maintained.

Skull and upper cervical spine: Craniocervical junction within
normal limits. Visualized upper cervical spine within normal limits.
Bone marrow signal intensity normal. No focal marrow replacing
lesion. No scalp soft tissue abnormality.

Sinuses/Orbits: Globes and orbital soft tissues within normal
limits. Paranasal sinuses are clear. No mastoid effusion. Inner ear
structures grossly normal.

Other: None.

MRA HEAD FINDINGS

ANTERIOR CIRCULATION:

Both internal carotid arteries widely patent to the termini without
stenosis. A1 segments widely patent. Normal anterior communicating
artery complex. Both anterior cerebral arteries widely patent to
their distal aspects without stenosis. No M1 stenosis or occlusion.
Normal MCA bifurcations. Distal MCA branches well perfused and
symmetric.

POSTERIOR CIRCULATION:

Both V4 segments patent to the vertebrobasilar junction without
stenosis. Both PICA origins patent and normal. Basilar widely patent
to its distal aspect without stenosis. Superior cerebellar arteries
patent bilaterally. Both PCAs primarily supplied via the basilar and
are well perfused to there distal aspects.

No intracranial aneurysm or other vascular abnormality.

MRA NECK FINDINGS

AORTIC ARCH: Visualized aortic arch normal in caliber with normal 3
vessel morphology. No hemodynamically significant stenosis about the
origin of the great vessels. Visualized subclavian arteries widely
patent.

RIGHT CAROTID SYSTEM: Right common and internal carotid arteries
widely patent without stenosis, evidence for dissection or
occlusion.

LEFT CAROTID SYSTEM: Left common and internal carotid arteries
widely patent without stenosis, evidence for dissection or
occlusion.

VERTEBRAL ARTERIES: Both vertebral arteries arise from the
subclavian arteries. No proximal subclavian artery stenosis. Both
vertebral arteries widely patent without stenosis, evidence for
dissection or occlusion.
IMPRESSION: 1. Abnormal flow void within the posterosuperior sagittal sinus,
extending into the left transverse and sigmoid sinuses, consistent
with previously identified acute dural sinus thrombosis. No
associated venous infarct, hemorrhage, or other complication.
2. Otherwise normal brain MRI.
3. Normal intracranial MRA.
4. Normal MRA of the neck.

## 2021-03-08 IMAGING — MR MR MRA HEAD W/O CM
2 series · 14 of 48 positions shown · IV contrast (gadavist)
Comparison: Prior CT from [DATE].

CLINICAL DATA: Initial evaluation for intermittent headaches with
blurry vision for 1.5 weeks. History of migraines.

EXAM:
MRI HEAD WITHOUT AND WITH CONTRAST
MRA HEAD WITHOUT CONTRAST
MRA NECK WITHOUT AND WITH CONTRAST
TECHNIQUE: Multiplanar, multiecho pulse sequences of the brain and surrounding
structures were obtained without and with intravenous contrast.
Angiographic images of the Circle of Willis were obtained using MRA
technique without intravenous contrast. Angiographic images of the
neck were obtained using MRA technique without and with intravenous
contrast. Carotid stenosis measurements (when applicable) are
obtained utilizing NASCET criteria, using the distal internal
carotid diameter as the denominator.
CONTRAST:  10mL GADAVIST GADOBUTROL 1 MMOL/ML IV SOLN

[Series 5: (id) mt fs · axial · 1.4mm · 0.43mm/px · z∈[-81,+5]mm · 13 of 136 slices shown]
[im 1/136]
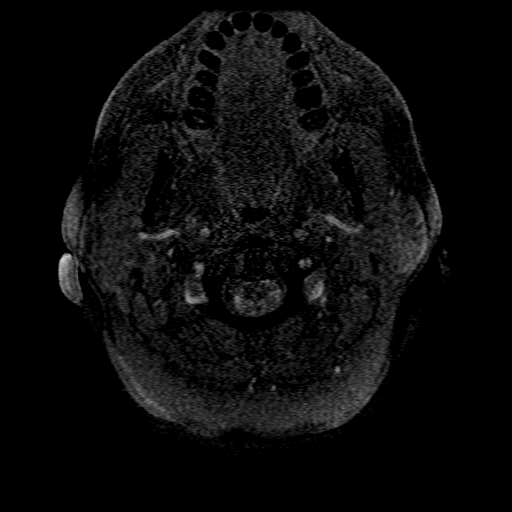
[im 3/136]
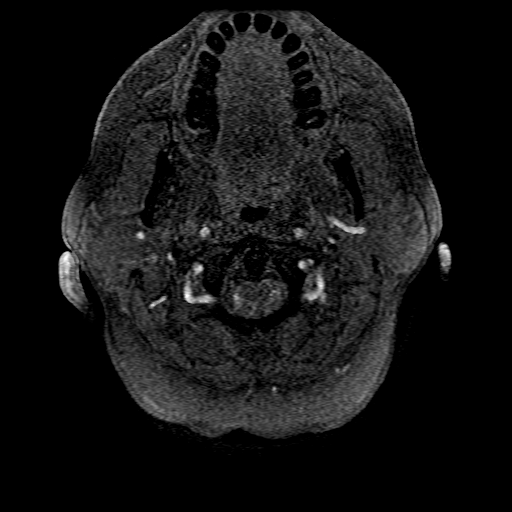
[im 6/136]
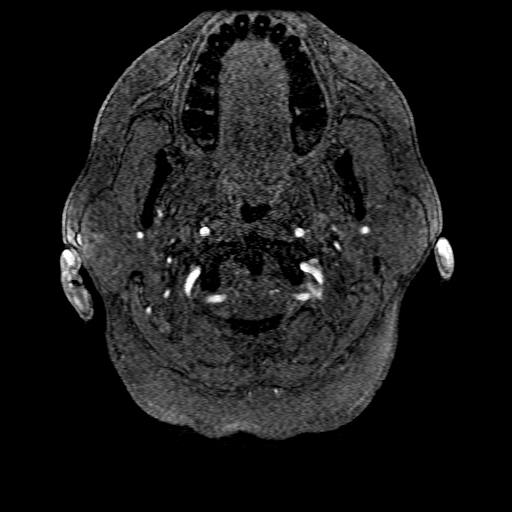
[im 21/136]
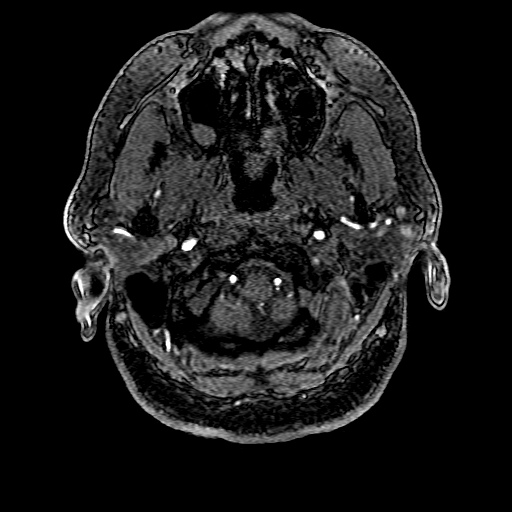
[im 24/136]
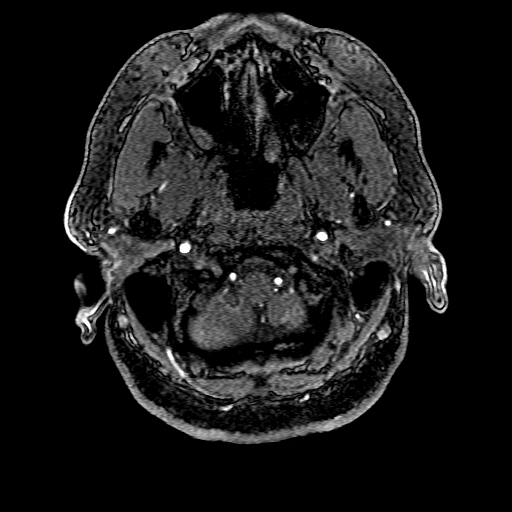
[im 42/136]
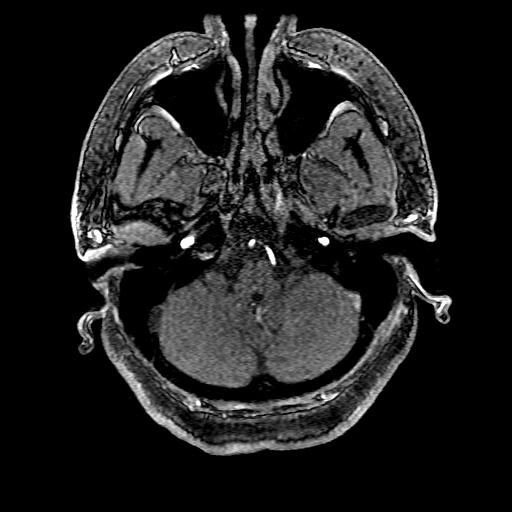
[im 59/136]
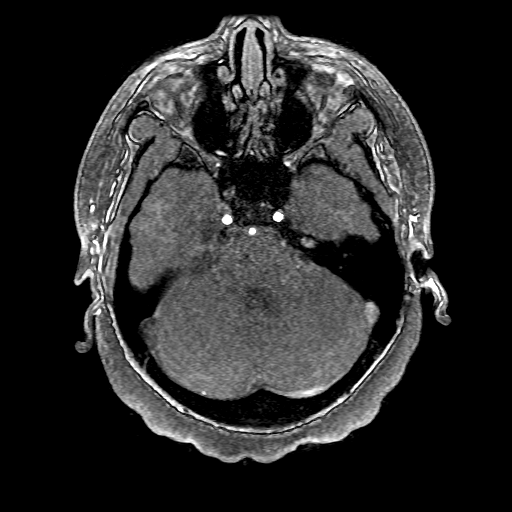
[im 68/136]
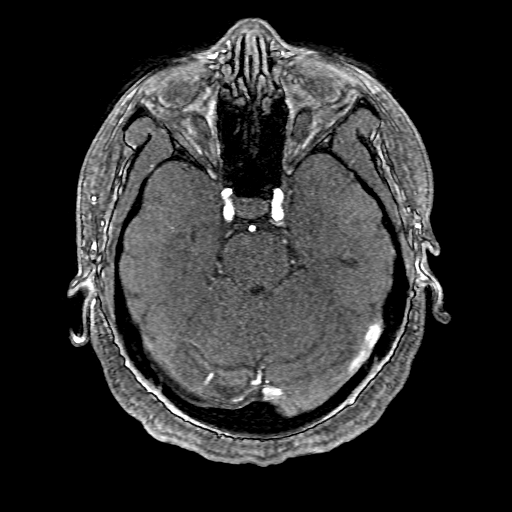
[im 77/136]
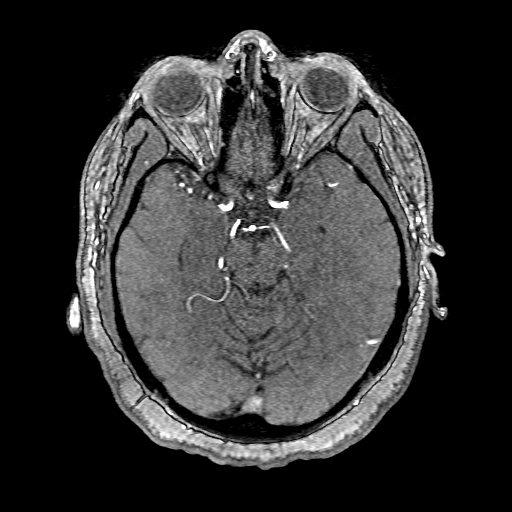
[im 94/136]
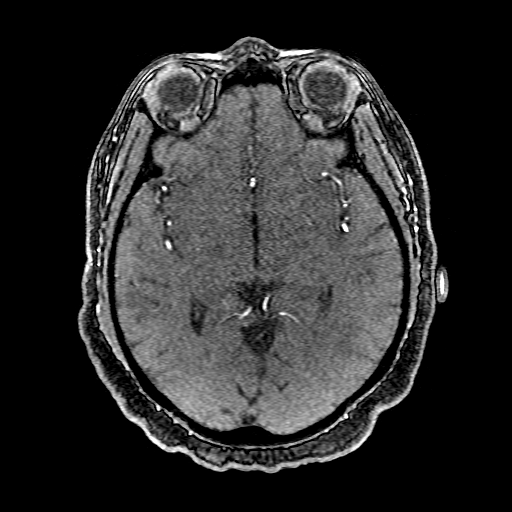
[im 112/136]
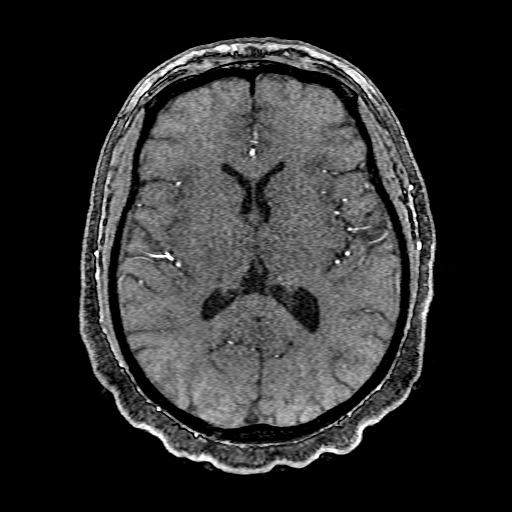
[im 115/136]
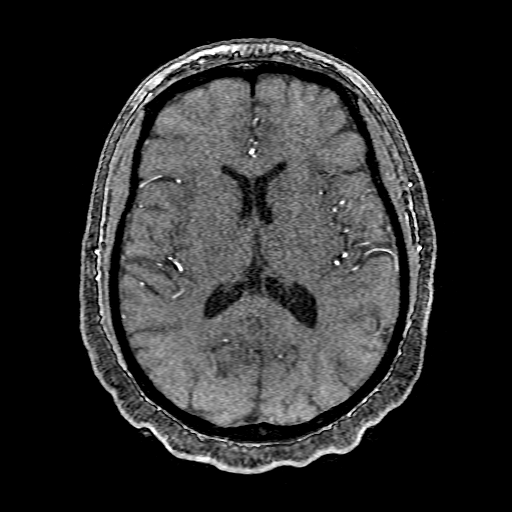
[im 130/136]
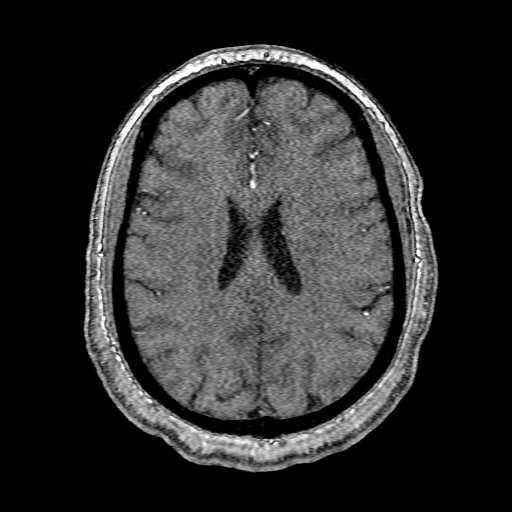

[Series 503: processed images · axial · 1.4mm · 0.21mm/px · 1 of 1 slices shown]
[im 1/1]
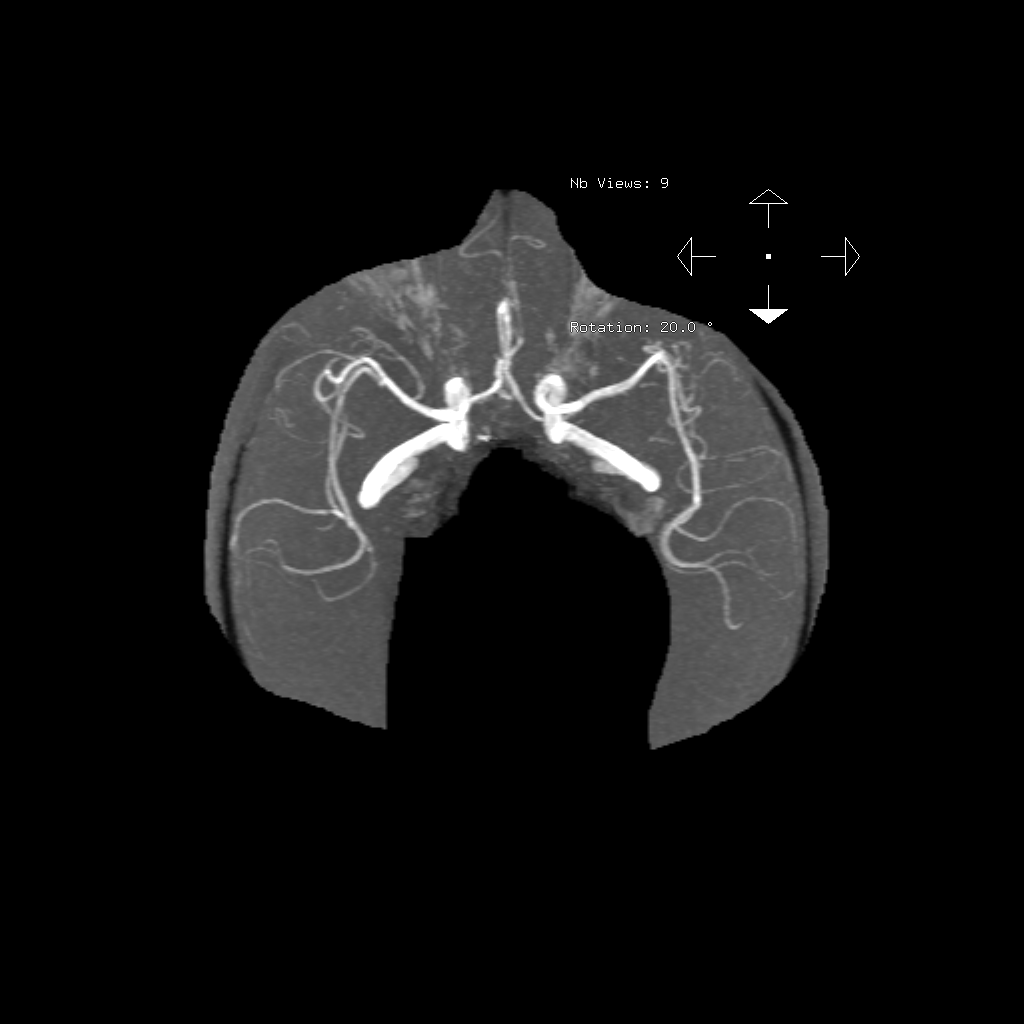

[14 of 48 positions shown; findings below may reference images not displayed]

FINDINGS: MRI HEAD FINDINGS

Brain: Cerebral volume within normal limits for patient age. No
focal parenchymal signal abnormality identified.

No abnormal foci of restricted diffusion to suggest acute or
subacute ischemia. Gray-white matter differentiation well
maintained. No encephalomalacia to suggest chronic cortical
infarction. No evidence for acute or chronic intracranial
hemorrhage.

No mass lesion, midline shift or mass effect. No hydrocephalus or
extra-axial fluid collection. Pituitary gland suprasellar region
normal. Midline structures intact.

No abnormal enhancement within the brain itself.

Vascular: Abnormal flow void within the posterosuperior sagittal
sinus near the torcula, extending into the left transverse and
sigmoid sinuses, consistent with history of acute dural sinus
thrombosis. Suspected associated small cortical vein thrombosis
noted as well on gradient echo sequence (series 7, image 8).
Associated reactive dural and leptomeningeal enhancement within this
region. No associated gyral swelling or edema. No evidence for acute
venous infarct or hemorrhage. Remainder of the major dural sinuses
demonstrate normal flow voids. Major intracranial arterial flow
voids are well maintained.

Skull and upper cervical spine: Craniocervical junction within
normal limits. Visualized upper cervical spine within normal limits.
Bone marrow signal intensity normal. No focal marrow replacing
lesion. No scalp soft tissue abnormality.

Sinuses/Orbits: Globes and orbital soft tissues within normal
limits. Paranasal sinuses are clear. No mastoid effusion. Inner ear
structures grossly normal.

Other: None.

MRA HEAD FINDINGS

ANTERIOR CIRCULATION:

Both internal carotid arteries widely patent to the termini without
stenosis. A1 segments widely patent. Normal anterior communicating
artery complex. Both anterior cerebral arteries widely patent to
their distal aspects without stenosis. No M1 stenosis or occlusion.
Normal MCA bifurcations. Distal MCA branches well perfused and
symmetric.

POSTERIOR CIRCULATION:

Both V4 segments patent to the vertebrobasilar junction without
stenosis. Both PICA origins patent and normal. Basilar widely patent
to its distal aspect without stenosis. Superior cerebellar arteries
patent bilaterally. Both PCAs primarily supplied via the basilar and
are well perfused to there distal aspects.

No intracranial aneurysm or other vascular abnormality.

MRA NECK FINDINGS

AORTIC ARCH: Visualized aortic arch normal in caliber with normal 3
vessel morphology. No hemodynamically significant stenosis about the
origin of the great vessels. Visualized subclavian arteries widely
patent.

RIGHT CAROTID SYSTEM: Right common and internal carotid arteries
widely patent without stenosis, evidence for dissection or
occlusion.

LEFT CAROTID SYSTEM: Left common and internal carotid arteries
widely patent without stenosis, evidence for dissection or
occlusion.

VERTEBRAL ARTERIES: Both vertebral arteries arise from the
subclavian arteries. No proximal subclavian artery stenosis. Both
vertebral arteries widely patent without stenosis, evidence for
dissection or occlusion.
IMPRESSION: 1. Abnormal flow void within the posterosuperior sagittal sinus,
extending into the left transverse and sigmoid sinuses, consistent
with previously identified acute dural sinus thrombosis. No
associated venous infarct, hemorrhage, or other complication.
2. Otherwise normal brain MRI.
3. Normal intracranial MRA.
4. Normal MRA of the neck.

## 2021-03-08 MED ORDER — GADOBUTROL 1 MMOL/ML IV SOLN
10.0000 mL | Freq: Once | INTRAVENOUS | Status: AC | PRN
  Administered 2021-03-08: 10 mL via INTRAVENOUS

## 2021-03-08 MED ORDER — BUPROPION HCL ER (XL) 150 MG PO TB24: 300.0000 mg | ORAL_TABLET | Freq: Every day | ORAL | Status: DC

## 2021-03-08 MED ORDER — TRAZODONE HCL 50 MG PO TABS: 50.0000 mg | ORAL_TABLET | Freq: Every evening | ORAL | Status: DC | PRN

## 2021-03-08 MED ORDER — ATORVASTATIN CALCIUM 80 MG PO TABS
80.0000 mg | ORAL_TABLET | Freq: Every day | ORAL | Status: DC
  Administered 2021-03-08: 80 mg via ORAL
  Filled 2021-03-08: qty 1

## 2021-03-08 MED ORDER — IBUPROFEN 200 MG PO TABS: 600.0000 mg | ORAL_TABLET | ORAL | Status: DC | PRN

## 2021-03-08 MED ORDER — LURASIDONE HCL 20 MG PO TABS: 20.0000 mg | ORAL_TABLET | Freq: Every day | ORAL | Status: DC

## 2021-03-08 MED ORDER — ESCITALOPRAM OXALATE 10 MG PO TABS
20.0000 mg | ORAL_TABLET | Freq: Every day | ORAL | Status: DC
  Filled 2021-03-08 (×2): qty 2

## 2021-03-08 MED ORDER — ACETAMINOPHEN 325 MG PO TABS
650.0000 mg | ORAL_TABLET | Freq: Four times a day (QID) | ORAL | Status: DC | PRN
  Administered 2021-03-08 (×2): 650 mg via ORAL
  Filled 2021-03-08 (×2): qty 2

## 2021-03-08 MED ORDER — ESCITALOPRAM OXALATE 10 MG PO TABS
5.0000 mg | ORAL_TABLET | Freq: Every day | ORAL | Status: DC
  Administered 2021-03-08 – 2021-03-11 (×4): 5 mg via ORAL
  Filled 2021-03-08 (×3): qty 1

## 2021-03-08 MED ORDER — ONDANSETRON HCL 4 MG/2ML IJ SOLN: 4.0000 mg | Freq: Four times a day (QID) | INTRAMUSCULAR | Status: DC | PRN

## 2021-03-08 MED ORDER — PRAZOSIN HCL 1 MG PO CAPS: 1.0000 mg | ORAL_CAPSULE | Freq: Every day | ORAL | Status: DC

## 2021-03-08 MED ORDER — IBUPROFEN 200 MG PO TABS
600.0000 mg | ORAL_TABLET | ORAL | Status: DC | PRN
  Administered 2021-03-08: 600 mg via ORAL
  Filled 2021-03-08: qty 3

## 2021-03-08 MED ORDER — PROCHLORPERAZINE EDISYLATE 10 MG/2ML IJ SOLN: 10.0000 mg | Freq: Four times a day (QID) | INTRAMUSCULAR | Status: DC | PRN

## 2021-03-08 MED ORDER — ATORVASTATIN CALCIUM 40 MG PO TABS
40.0000 mg | ORAL_TABLET | Freq: Every day | ORAL | Status: DC
  Administered 2021-03-09 – 2021-03-11 (×3): 40 mg via ORAL
  Filled 2021-03-08 (×3): qty 1

## 2021-03-08 NOTE — Progress Notes (Signed)
ANTICOAGULATION CONSULT NOTE  Pharmacy Consult for heparin Indication: central venous thrombosis brain  No Known Allergies  Patient Measurements: Height: 5\' 2"  (157.5 cm) Weight: 89 kg (196 lb 1.6 oz) IBW/kg (Calculated) : 50.1 Heparin Dosing Weight: 69.4 kg  Vital Signs: Temp: 98.6 F (37 C) (03/25 1155) Temp Source: Oral (03/25 1155) BP: 128/85 (03/25 1155) Pulse Rate: 82 (03/25 1155)  Labs: Recent Labs    03/07/21 1819 03/08/21 0335 03/08/21 0529 03/08/21 1056  HGB 11.9* 11.0*  --   --   HCT 36.4 33.9*  --   --   PLT 264 254  --   --   HEPARINUNFRC  --   --  0.42 0.42  CREATININE 0.91 0.76  --   --     Estimated Creatinine Clearance: 90.2 mL/min (by C-G formula based on SCr of 0.76 mg/dL).   Medical History: Past Medical History:  Diagnosis Date  . Anxiety   . Depression   . Ectopic pregnancy   . Migraine   . Osteoarthritis   . Plantar fasciitis   . PTSD (post-traumatic stress disorder)     Assessment: 69 YOF admitted with headache. CT revealed central venous thrombosis without evidence of ICH. Pharmacy has been consulted to dose heparin. Per discussion with Dr. 57, ok to bolus heparin.   Confirmatory heparin level therapeutic. CBC stable. No active bleed issues reported.  Goal of Therapy:  Heparin level 0.3-0.5 units/mL Monitor platelets by anticoagulation protocol: Yes   Plan:  Continue heparin at 1150 units/hr Monitor daily heparin level and CBC, s/sx bleeding   Wilford Corner, PharmD, BCPS Please check AMION for all [facility] contact numbers Clinical Pharmacist 03/08/2021 2:10 PM

## 2021-03-08 NOTE — Progress Notes (Addendum)
STROKE TEAM PROGRESS NOTE  HPI:  Jasmine [name] is a 45 y.o. female past medical history of anxiety, depression, PTSD, prior ectopic pregnancies, migraine in the past with last migrainous headache 3 to 4 years ago, presented to the emergency room for evaluation of headaches and blurred vision that started sometime Sunday evening and did not get better and continued to get worse. She describes the headache as a sense of pressure on her forehead as well as left temple and left back of the head along with some pain in her neck as well. She was evaluated by the ED provider.  A CT venogram was obtained that showed dural venous sinus thrombosis. She was placed on heparin drip. No neurologic deficits were apparent.   INTERVAL HISTORY No acute events overnight. BP stable with mild elevation to 140s.. Afebrile. Neurologically intact and stable.  Endorses bilat temporal headache "low level like sinus pressure" that has been intermittent since yesterday and currently present. She denies new symptoms of concern and specifically denies vision changes, dizziness, nausea, numbness/tingling, weakness.   We discussed her venous sinus thrombosis diagnosis, ongoing work up and plan of care. She had no questions at this point.  No visitors at bedside  Vitals:   03/07/21 2314 03/08/21 0312 03/08/21 0732 03/08/21 1155  BP: (!) 143/94 125/75 114/76 128/85  Pulse: 85 83 91 82  Resp: 13 19 14 13   Temp: 98.4 F (36.9 C) 98.5 F (36.9 C) 97.6 F (36.4 C) 98.6 F (37 C)  TempSrc: Oral Oral Oral Oral  SpO2: 99% 97% 98% 98%  Weight:      Height:       CBC:  Recent Labs  Lab 03/07/21 1819 03/08/21 0335  WBC 5.8 6.6  NEUTROABS 3.6  --   HGB 11.9* 11.0*  HCT 36.4 33.9*  MCV 91.2 91.1  PLT 264 254   Basic Metabolic Panel:  Recent Labs  Lab 03/07/21 1819 03/08/21 0335  NA 136 136  K 3.9 3.6  CL 103 107  CO2 23 21*  GLUCOSE 120* 102*  BUN 12 8  CREATININE 0.91 0.76  CALCIUM 8.5* 8.0*   Lipid  Panel:  Recent Labs  Lab 03/08/21 1056  CHOL 168  TRIG 61  HDL 45  CHOLHDL 3.7  VLDL 12  LDLCALC 03/10/21*   HgbA1c:  Recent Labs  Lab 03/08/21 1056  HGBA1C 6.0*   Urine Drug Screen: No results for input(s): LABOPIA, COCAINSCRNUR, LABBENZ, AMPHETMU, THCU, LABBARB in the last 168 hours.  Alcohol Level No results for input(s): ETH in the last 168 hours.  IMAGING past 24 hours CT VENOGRAM HEAD  Result Date: 03/07/2021 CLINICAL DATA:  Dural venous sinus thrombosis suspected; atypical headache, blurry vision. Additional history provided: Patient reports intermittent headache for 1 and half weeks with some blurred vision, recent hypertensive readings, history of migraines. EXAM: CT VENOGRAM HEAD TECHNIQUE: Contiguous axial images were obtained from the base of the skull through the vertex without intravenous contrast. Subsequently, CT venography of the head was performed following bolus administration of 100 mL Omnipaque 300 intravenous contrast. Coronal and sagittal thin reconstructions were submitted. Axial, coronal and sagittal thick MIP reconstructions were also submitted. CONTRAST:  03/09/2021 OMNIPAQUE IOHEXOL 300 MG/ML  SOLN COMPARISON:  Prior head CT examination 03/04/2016. FINDINGS: CT Head: Brain: Cerebral volume is normal. There is no acute intracranial hemorrhage. No demarcated cortical infarct. No extra-axial fluid collection. No evidence of intracranial mass. No midline shift. Vascular: No hyperdense vessel. Skull: Normal. Negative for fracture  or focal lesion. Sinuses/Orbits: Visualized orbits show no acute finding. No significant paranasal sinus disease at the imaged levels. CT venogram head: Filling defect within the inferior aspect of the superior sagittal sinus and confluence of sinuses compatible with subocclusive thrombus. Filling defect throughout the left transverse and sigmoid dural venous sinuses compatible with near occlusive thrombus. There appears to be some enhancement within the  upper left internal jugular vein. Elsewhere, the superior sagittal sinus is patent. The internal cerebral veins, vein of Galen, straight sinus, right transverse sinus and right sigmoid sinus appear patent. Enhancement is seen within the visualized upper right internal jugular vein. These results were called by telephone at the time of interpretation on 03/07/2021 at 7:41 pm to provider [facility] , who verbally acknowledged these results. IMPRESSION: 1. Subocclusive thrombus within the inferior aspect of the superior sagittal sinus and confluence of sinuses. 2. Near occlusive thrombus throughout the left transverse and sigmoid dural venous sinuses. Some enhancement is seen within the visualized upper left internal jugular vein. 3. No evidence of acute intracranial hemorrhage or acute infarct. Electronically Signed   By: Jackey Loge DO   On: 03/07/2021 19:41   PHYSICAL EXAM  Temp:  [97.6 F (36.4 C)-99.1 F (37.3 C)] 98.6 F (37 C) (03/25 1155) Pulse Rate:  [82-120] 82 (03/25 1155) Resp:  [13-20] 13 (03/25 1155) BP: (114-143)/(75-97) 128/85 (03/25 1155) SpO2:  [97 %-100 %] 98 % (03/25 1155) Weight:  [85.3 kg-89 kg] 89 kg (03/24 2026)  General - Well nourished, well developed middle aged female, in no apparent distress.   Cardiovascular - Regular rhythm and rate.   Mental Status -  Level of arousal and orientation to time, place,situation and person were intact.  Language including expression, naming, repetition, comprehension was assessed and found intact. Attention span and concentration were normal. Recent and remote memory were intact.   Cranial Nerves II - XII - II - Visual field intact OU. III, IV, VI - Extraocular movements intact. V - Facial sensation intact bilaterally. VII - Facial movement intact bilaterally. VIII - Hearing & vestibular intact bilaterally. X - Palate elevates symmetrically. XI - Chin turning & shoulder shrug intact bilaterally. XII - Tongue protrusion  intact.  Motor Strength - The patient's strength was normal in all extremities and pronator drift was absent.  Bulk was normal and fasciculations were absent.   Motor Tone - Muscle tone was assessed at the neck and appendages and was normal. .  Sensory - Light touch was assessed in all extremities x4 and was symmetrical.    Coordination - The patient had normal movements in the hands and feet with no ataxia or dysmetria.  Tremor was absent.  Gait and Station - deferred.  ASSESSMENT/PLAN Jasmine [name] is a 45 y.o. female past medical history of anxiety, depression, PTSD, prior ectopic pregnancies, migraine in the past with last migrainous headache 3 to 4 years ago, presented to the emergency room ([facility]) for evaluation of headaches and blurred vision that started sometime Sunday evening and did not get better and continued to get worse. She describes the headache as a sense of pressure on her forehead as well as left temple and left back of the head along with some pain in her neck as well. She was evaluated by the ED provider. A CT venogram was obtained that showed dural venous sinus thrombosis. No focal neurologic deficits upon presentation.  She was placed on heparin drip and transferred to [facility] for further neurologic  evaluation and care.   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 Near occlusive thrombus throughout the left transverse and sigmoid sinuses.   MRI  pending  MRA  head and neck pending  2D Echo EF 55-60%  Hypercoagulabe labs are pending (drawn prior to heparin)  LDL 111  HgbA1c 6.0 VTE prophylaxis - on heparin gtt  No anticoagulant/antiplatelet prior to admission, currently on heparin drip. If tolerating heparin IV in 2 days, consider to switch to DOAC before discharge.  Therapy  recommendations:  pending  Disposition:  TBD  Migraine   Not have migraine for the last 3 years  Typical migraine symptoms including headache, photophobia, phonophobia, nausea vomiting  This time headache resolved photophobia, phonophobia nausea vomiting.  Hyperlipidemia  Home meds: None   LDL 111, not at goal < 70  Lipitor 40mg  added   Continue statin at discharge  Other Stroke Risk Factors    Other Active Problems  This plan of care was directed by Dr. . Roda Shutters, NP-C   Hospital day # 1  ATTENDING NOTE: I reviewed above note and agree with the assessment and plan. Pt was seen and examined.   45 year old female with history of PTSD, anxiety, migraine presented to headache and blurry vision.  She stated that she had headache for the last 2 weeks which was not her typical migraine headache, she tried to ease off by her own but yesterday she had left head sudden sharp pain during driving, she went to med [facility] and had a CTV showed cerebral venous sinus thrombosis and she was transferred to [facility] for management.  She denies smoking or pregnancy.  Currently, patient only complains of very mild headache, otherwise neurological intact.  She is heparin IV now.  Will continue further work-up with MRI brain, MRA head and neck and hypercoagulable work-up.  EF 55 to 60%, LDL 111, A1c 6.0.  HIV negative.  Creatinine 0.76.  Plan to continue heparin IV for the next couple days, if tolerating well, will switch to p.o. DOAC on discharge. Will follow.   For detailed assessment and plan, please refer to above as I have made changes wherever appropriate.   [facility], MD PhD Stroke Neurology 03/08/2021 4:58 PM     To contact Stroke Continuity provider, please refer to 03/10/2021. After hours, contact General Neurology

## 2021-03-08 NOTE — Progress Notes (Signed)
Jasmine [name]  HFW:[id number] DOB: 05-09-1976 DOA: 03/07/2021 PCP: Farris Has, MD    Brief Narrative:  [phone] with a history of anxiety/depression, PTSD, and migraine headaches who presented to the ER with severe left temporal headache (different than her last remote migraine) and blurry vision which had persisted over a week.  In the ER CT venogram revealed dural venous sinus thrombosis.  Significant Events:  3/24 admit via [facility] 3/24 CT venogram head - dural venous sinus thrombosis   Antimicrobials:  None  DVT prophylaxis: IV heparin  Consultants:  Neurology  Code Status: FULL CODE  Subjective: Afeb. BP controlled. Sats normal.  Reports a headache persists but is improving.  Denies focal neurologic deficits.  Denies chest pain or shortness of breath.  Assessment & Plan:  Acute dural venous sinus thrombosis Etiology unclear -neurology directing treatment -continue IV heparin -hypercoagulable work-up underway  Anxiety/depression Continue home Lexapro  Normocytic anemia  Likely simply related to menstrual loss    Family Communication:  Status is: Inpatient  Remains inpatient appropriate because:Inpatient level of care appropriate due to severity of illness   Dispo: The patient is from: Home              Anticipated d/c is to: Home              Patient currently is not medically stable to d/c.   Difficult to place patient No    Objective: Blood pressure 114/76, pulse 91, temperature 97.6 F (36.4 C), temperature source Oral, resp. rate 14, height 5\' 2"  (1.575 m), weight 89 kg, SpO2 98 %.  Intake/Output Summary (Last 24 hours) at 03/08/2021 0756 Last data filed at 03/07/2021 2058 Gross per 24 hour  Intake 1000 ml  Output --  Net 1000 ml   Filed Weights   03/07/21 1714 03/07/21 2026  Weight: 85.3 kg 89 kg    Examination: General: No acute respiratory distress Lungs: Clear to auscultation bilaterally without wheezes or crackles Cardiovascular: Regular  rate and rhythm without murmur gallop or rub normal S1 and S2 Abdomen: Nontender, nondistended, soft, bowel sounds positive, no rebound, no ascites, no appreciable mass Extremities: No significant cyanosis, clubbing, or edema bilateral lower extremities  CBC: Recent Labs  Lab 03/07/21 1819 03/08/21 0335  WBC 5.8 6.6  NEUTROABS 3.6  --   HGB 11.9* 11.0*  HCT 36.4 33.9*  MCV 91.2 91.1  PLT 264 254   Basic Metabolic Panel: Recent Labs  Lab 03/07/21 1819 03/08/21 0335  NA 136 136  K 3.9 3.6  CL 103 107  CO2 23 21*  GLUCOSE 120* 102*  BUN 12 8  CREATININE 0.91 0.76  CALCIUM 8.5* 8.0*   GFR: Estimated Creatinine Clearance: 90.2 mL/min (by C-G formula based on SCr of 0.76 mg/dL).  Liver Function Tests: No results for input(s): AST, ALT, ALKPHOS, BILITOT, PROT, ALBUMIN in the last 168 hours. No results for input(s): LIPASE, AMYLASE in the last 168 hours. No results for input(s): AMMONIA in the last 168 hours.  Coagulation Profile: No results for input(s): INR, PROTIME in the last 168 hours.   Recent Results (from the past 240 hour(s))  Resp Panel by RT-PCR (Flu A&B, Covid) Nasopharyngeal Swab     Status: None   Collection Time: 03/07/21  8:07 PM   Specimen: Nasopharyngeal Swab; Nasopharyngeal(NP) swabs in vial transport medium  Result Value Ref Range Status   SARS Coronavirus 2 by RT PCR NEGATIVE NEGATIVE Final    Comment: (NOTE) SARS-CoV-2 target nucleic acids are  NOT DETECTED.  The SARS-CoV-2 RNA is generally detectable in upper respiratory specimens during the acute phase of infection. The lowest concentration of SARS-CoV-2 viral copies this assay can detect is 138 copies/mL. A negative result does not preclude SARS-Cov-2 infection and should not be used as the sole basis for treatment or other patient management decisions. A negative result may occur with  improper specimen collection/handling, submission of specimen other than nasopharyngeal swab, presence of  viral mutation(s) within the areas targeted by this assay, and inadequate number of viral copies(<138 copies/mL). A negative result must be combined with clinical observations, patient history, and epidemiological information. The expected result is Negative.  Fact Sheet for Patients:  BloggerCourse.com  Fact Sheet for Healthcare Providers:  SeriousBroker.it  This test is no t yet approved or cleared by the [place] FDA and  has been authorized for detection and/or diagnosis of SARS-CoV-2 by FDA under an Emergency Use Authorization (EUA). This EUA will remain  in effect (meaning this test can be used) for the duration of the COVID-19 declaration under Section 564(b)(1) of the Act, 21 U.S.C.section 360bbb-3(b)(1), unless the authorization is terminated  or revoked sooner.       Influenza A by PCR NEGATIVE NEGATIVE Final   Influenza B by PCR NEGATIVE NEGATIVE Final    Comment: (NOTE) The Xpert Xpress SARS-CoV-2/FLU/RSV plus assay is intended as an aid in the diagnosis of influenza from Nasopharyngeal swab specimens and should not be used as a sole basis for treatment. Nasal washings and aspirates are unacceptable for Xpert Xpress SARS-CoV-2/FLU/RSV testing.  Fact Sheet for Patients: BloggerCourse.com  Fact Sheet for Healthcare Providers: SeriousBroker.it  This test is not yet approved or cleared by the [place] FDA and has been authorized for detection and/or diagnosis of SARS-CoV-2 by FDA under an Emergency Use Authorization (EUA). This EUA will remain in effect (meaning this test can be used) for the duration of the COVID-19 declaration under Section 564(b)(1) of the Act, 21 U.S.C. section 360bbb-3(b)(1), unless the authorization is terminated or revoked.  Performed at [facility], [address] [place], [place] [address]   MRSA PCR Screening      Status: None   Collection Time: 03/07/21 11:25 PM   Specimen: Nasal Mucosa; Nasopharyngeal  Result Value Ref Range Status   MRSA by PCR NEGATIVE NEGATIVE Final    Comment:        The GeneXpert MRSA Assay (FDA approved for NASAL specimens only), is one component of a comprehensive MRSA colonization surveillance program. It is not intended to diagnose MRSA infection nor to guide or monitor treatment for MRSA infections. Performed at [facility], [address] [place], [place] [address]      Scheduled Meds: . escitalopram  20 mg Oral Daily   Continuous Infusions: . heparin 1,150 Units/hr (03/07/21 2056)     LOS: 1 day   Lonia Blood, MD [facility] Office  [phone] Pager - Text Page per Amion  If 7PM-7AM, please contact night-coverage per Amion 03/08/2021, 7:56 AM

## 2021-03-08 NOTE — Progress Notes (Signed)
ANTICOAGULATION CONSULT NOTE - Initial Consult  Pharmacy Consult for heparin Indication: central venous thrombosis brain  No Known Allergies  Patient Measurements: Height: 5\' 2"  (157.5 cm) Weight: 89 kg (196 lb 1.6 oz) IBW/kg (Calculated) : 50.1 Heparin Dosing Weight: 69.4 kg  Vital Signs: Temp: 98.5 F (36.9 C) (03/25 0312) Temp Source: Oral (03/25 0312) BP: 125/75 (03/25 0312) Pulse Rate: 83 (03/25 0312)  Labs: Recent Labs    03/07/21 1819 03/08/21 0335 03/08/21 0529  HGB 11.9* 11.0*  --   HCT 36.4 33.9*  --   PLT 264 254  --   HEPARINUNFRC  --   --  0.42  CREATININE 0.91 0.76  --     Estimated Creatinine Clearance: 90.2 mL/min (by C-G formula based on SCr of 0.76 mg/dL).   Medical History: Past Medical History:  Diagnosis Date  . Anxiety   . Depression   . Ectopic pregnancy   . Migraine   . Osteoarthritis   . Plantar fasciitis   . PTSD (post-traumatic stress disorder)     Assessment: 69 YOF admitted with headache. CT revealed central venous thrombosis without evidence of ICH. Pharmacy has been consulted to dose heparin.   No AC PTA. Hgb 11.9, PLTs WNL. Per discussion with Dr. 57, okay to bolus heparin.   3/25 AM update:  Initial Heparin level is therapeutic   Goal of Therapy:  Heparin level 0.3-0.5 units/mL Monitor platelets by anticoagulation protocol: Yes   Plan:  Cont heparin 1150 units/hr 1200 heparin level  4/25, PharmD, BCPS Clinical Pharmacist Phone: [phone]

## 2021-03-08 NOTE — Progress Notes (Signed)
Pt arrived on unit via [facility]. Vital signs stable. Pt A&Ox4. IV intact and infusing Heparin @11 .5 ml/hr. Swallow study done per Dr. request. Belongings at bedside (phone, charger, and clothing). Pts mother at bedside. Pt oriented to unit. Call bell in reach, bed in lowest setting, bed alarm on.

## 2021-03-08 NOTE — Progress Notes (Signed)
  Echocardiogram 2D Echocardiogram has been performed.  Jasmine [name] F 03/08/2021, 11:49 AM
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
## 2021-03-09 LAB — HEPARIN LEVEL (UNFRACTIONATED)
Heparin Unfractionated: 0.15 IU/mL — ABNORMAL LOW (ref 0.30–0.70)
Heparin Unfractionated: 0.38 IU/mL (ref 0.30–0.70)

## 2021-03-09 LAB — CBC
HCT: 34 % — ABNORMAL LOW (ref 36.0–46.0)
Hemoglobin: 11.2 g/dL — ABNORMAL LOW (ref 12.0–15.0)
MCH: 30.1 pg (ref 26.0–34.0)
MCHC: 32.9 g/dL (ref 30.0–36.0)
MCV: 91.4 fL (ref 80.0–100.0)
Platelets: 310 10*3/uL (ref 150–400)
RBC: 3.72 MIL/uL — ABNORMAL LOW (ref 3.87–5.11)
RDW: 13.5 % (ref 11.5–15.5)
WBC: 5.7 10*3/uL (ref 4.0–10.5)
nRBC: 0 % (ref 0.0–0.2)

## 2021-03-09 LAB — PROTEIN S ACTIVITY: Protein S Activity: 37 % — ABNORMAL LOW (ref 63–140)

## 2021-03-09 LAB — PROTEIN S, TOTAL: Protein S Ag, Total: 96 % (ref 60–150)

## 2021-03-09 LAB — PROTEIN C ACTIVITY: Protein C Activity: 152 % (ref 73–180)

## 2021-03-09 NOTE — Progress Notes (Signed)
ANTICOAGULATION CONSULT NOTE Pharmacy Consult for heparin Indication: dural venous sinus thrombosis  No Known Allergies  Patient Measurements: Height: 5\' 2"  (157.5 cm) Weight: 89 kg (196 lb 1.6 oz) IBW/kg (Calculated) : 50.1 Heparin Dosing Weight: 69.4 kg  Vital Signs: Temp: 97.9 F (36.6 C) (03/26 1159) Temp Source: Oral (03/26 1159) BP: 139/90 (03/26 1159) Pulse Rate: 77 (03/26 1159)  Labs: Recent Labs    03/07/21 1819 03/08/21 0335 03/08/21 0529 03/08/21 1056 03/09/21 0128 03/09/21 1228  HGB 11.9* 11.0*  --   --  11.2*  --   HCT 36.4 33.9*  --   --  34.0*  --   PLT 264 254  --   --  310  --   HEPARINUNFRC  --   --    < > 0.42 0.15* 0.38  CREATININE 0.91 0.76  --   --   --   --    < > = values in this interval not displayed.    Estimated Creatinine Clearance: 90.2 mL/min (by C-G formula based on SCr of 0.76 mg/dL).   Assessment: 45 yo female admitted with headache, found to have dural venous sinus thrombosis and continuing on heparin drip. Patient is not on anticoagulation PTA. Heparin level therapeutic after rate increase this morning. CBC wnl. No active bleed issues reported.  Goal of Therapy:  Heparin level 0.3-0.5 units/mL Monitor platelets by anticoagulation protocol: Yes   Plan:  Continue heparin drip at 1300 units/hr Monitor daily heparin level and CBC, s/sx bleeding Consider switch to DOAC 3/28 prior to discharge if tolerating per Neuro   4/28, PharmD, BCPS Please check AMION for all [facility] contact numbers Clinical Pharmacist 03/09/2021 1:00 PM

## 2021-03-09 NOTE — Progress Notes (Signed)
Physical Therapy Treatment Patient Details Name: Jasmine [name] MRN: [id number] DOB: 16-Mar-1976 Today's Date: 03/09/2021    History of Present Illness Pt is a 45 y.o F who presents 3/24 with HA and blurred vision. 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 She was placed on a heparin drip. Significant PMH: anxiety, depression, PTSD, prior ectopic pregnancies.    PT Comments    Patient evaluated by Physical Therapy with no further acute PT needs identified. 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 Pt denies headache and reports mild dizziness with near sighted reading. Pt ambulating x 400 ft and negotiated steps independently. Able to locate objects in all visual fields. BP post mobility 135/95. Education provided regarding exercise recommendations. All education has been completed and the patient has no further questions. No follow-up Physical Therapy or equipment needs. PT is signing off. Thank you for this referral.    Follow Up Recommendations  No PT follow up     Equipment Recommendations  None recommended by PT    Recommendations for Other Services       Precautions / Restrictions Precautions Precautions: None Restrictions Weight Bearing Restrictions: No    Mobility  Bed Mobility Overal bed mobility: Modified Independent                  Transfers Overall transfer level: Independent Equipment used: None                Ambulation/Gait Ambulation/Gait assistance: Independent Gait Distance (Feet): 400 Feet Assistive device: None Gait Pattern/deviations: WFL(Within Functional Limits)         Stairs Stairs: Yes Stairs assistance: Modified independent (Device/Increase time) Stair Management: One rail Left Number of Stairs: 3 General stair comments: step over step pattern   Wheelchair Mobility    Modified Rankin (Stroke Patients Only) Modified Rankin (Stroke Patients Only) Pre-Morbid Rankin  Score: No symptoms Modified Rankin: No symptoms     Balance Overall balance assessment: No apparent balance deficits (not formally assessed)                                          Cognition Arousal/Alertness: Awake/alert Behavior During Therapy: WFL for tasks assessed/performed Overall Cognitive Status: Within Functional Limits for tasks assessed                                        Exercises      General Comments        Pertinent Vitals/Pain Pain Assessment: No/denies pain    Home Living Family/patient expects to be discharged to:: Private residence Living Arrangements: Alone Available Help at Discharge: Family;Available PRN/intermittently (mother) Type of Home: House Home Access: Stairs to enter   Home Layout: One level Home Equipment: None      Prior Function Level of Independence: Independent      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, business consulting   PT Goals (current goals can now be found in the care plan section) Acute Rehab PT Goals Patient Stated Goal: go home PT Goal Formulation: All assessment and education complete, DC therapy    Frequency           PT Plan      Co-evaluation  AM-PAC PT "6 Clicks" Mobility   Outcome Measure  Help needed turning from your back to your side while in a flat bed without using bedrails?: None Help needed moving from lying on your back to sitting on the side of a flat bed without using bedrails?: None Help needed moving to and from a bed to a chair (including a wheelchair)?: None Help needed standing up from a chair using your arms (e.g., wheelchair or bedside chair)?: None Help needed to walk in hospital room?: None Help needed climbing 3-5 steps with a railing? : None 6 Click Score: 24    End of Session   Activity Tolerance: Patient tolerated treatment well Patient left: in bed;with call bell/phone within reach Nurse Communication: Mobility status PT  Visit Diagnosis: Pain Pain - part of body:  (headache)     Time: 3845-3646 PT Time Calculation (min) (ACUTE ONLY): 26 min  Charges:  $Therapeutic Activity: 8-22 mins                     Lillia Pauls, PT, DPT Acute Rehabilitation Services Pager [phone] Office [phone]    Norval Morton 03/09/2021, 4:04 PM

## 2021-03-09 NOTE — Progress Notes (Addendum)
STROKE TEAM PROGRESS NOTE  HPI:  Marcelyn Bruinsurora F Novacek is a 45 y.o. female past medical history of anxiety, depression, PTSD, prior ectopic pregnancies, migraine in the past with last migrainous headache 3 to 4 years ago, presented to the emergency room for evaluation of headaches and blurred vision that started sometime Sunday evening and did not get better and continued to get worse. She describes the headache as a sense of pressure on her forehead as well as left temple and left back of the head along with some pain in her neck as well.  She was evaluated by the ED provider.  A CT venogram was obtained that showed dural venous sinus thrombosis. She was placed on heparin drip. No neurologic deficits were apparent.   INTERVAL HISTORY No acute events overnight. BP stable with mild elevation to 140s.. Afebrile. Neurologically intact and stable.  Denies headaches today.  She denies new symptoms of concern and specifically denies vision changes, dizziness, nausea, numbness/tingling, weakness.   We discussed her venous sinus thrombosis diagnosis, ongoing work up and plan of care. She had no questions at this point.  No visitors at bedside.   Vitals:   03/08/21 2121 03/08/21 2310 03/09/21 0329 03/09/21 0725  BP: 131/88 125/79 129/78 115/89  Pulse: 89 92 88 83  Resp: 16 (!) 22 (!) 22 15  Temp: 98.4 F (36.9 C) 98.4 F (36.9 C) 97.8 F (36.6 C) 98.1 F (36.7 C)  TempSrc: Oral Oral Oral Oral  SpO2: 99% 98% 97% 99%  Weight:      Height:       CBC:  Recent Labs  Lab 03/07/21 1819 03/08/21 0335 03/09/21 0128  WBC 5.8 6.6 5.7  NEUTROABS 3.6  --   --   HGB 11.9* 11.0* 11.2*  HCT 36.4 33.9* 34.0*  MCV 91.2 91.1 91.4  PLT 264 254 310   Basic Metabolic Panel:  Recent Labs  Lab 03/07/21 1819 03/08/21 0335  NA 136 136  K 3.9 3.6  CL 103 107  CO2 23 21*  GLUCOSE 120* 102*  BUN 12 8  CREATININE 0.91 0.76  CALCIUM 8.5* 8.0*   Lipid Panel:  Recent Labs  Lab 03/08/21 1056  CHOL 168   TRIG 61  HDL 45  CHOLHDL 3.7  VLDL 12  LDLCALC 295111*   HgbA1c:  Recent Labs  Lab 03/08/21 1056  HGBA1C 6.0*    IMAGING past 24 hours MR ANGIO HEAD WO CONTRAST  Result Date: 03/08/2021 CLINICAL DATA:  Initial evaluation for intermittent headaches with blurry vision for 1.5 weeks. History of migraines. EXAM: MRI HEAD WITHOUT AND WITH CONTRAST MRA HEAD WITHOUT CONTRAST MRA NECK WITHOUT AND WITH CONTRAST TECHNIQUE: Multiplanar, multiecho pulse sequences of the brain and surrounding structures were obtained without and with intravenous contrast. Angiographic images of the Circle of Willis were obtained using MRA technique without intravenous contrast. Angiographic images of the neck were obtained using MRA technique without and with intravenous contrast. Carotid stenosis measurements (when applicable) are obtained utilizing NASCET criteria, using the distal internal carotid diameter as the denominator. CONTRAST:  10mL GADAVIST GADOBUTROL 1 MMOL/ML IV SOLN COMPARISON:  Prior CT from 03/07/2021. FINDINGS: MRI HEAD FINDINGS Brain: Cerebral volume within normal limits for patient age. No focal parenchymal signal abnormality identified. No abnormal foci of restricted diffusion to suggest acute or subacute ischemia. Gray-white matter differentiation well maintained. No encephalomalacia to suggest chronic cortical infarction. No evidence for acute or chronic intracranial hemorrhage. No mass lesion, midline shift or mass effect. No  hydrocephalus or extra-axial fluid collection. Pituitary gland suprasellar region normal. Midline structures intact. No abnormal enhancement within the brain itself. Vascular: Abnormal flow void within the posterosuperior sagittal sinus near the torcula, extending into the left transverse and sigmoid sinuses, consistent with history of acute dural sinus thrombosis. Suspected associated small cortical vein thrombosis noted as well on gradient echo sequence (series 7, image 8).  Associated reactive dural and leptomeningeal enhancement within this region. No associated gyral swelling or edema. No evidence for acute venous infarct or hemorrhage. Remainder of the major dural sinuses demonstrate normal flow voids. Major intracranial arterial flow voids are well maintained. Skull and upper cervical spine: Craniocervical junction within normal limits. Visualized upper cervical spine within normal limits. Bone marrow signal intensity normal. No focal marrow replacing lesion. No scalp soft tissue abnormality. Sinuses/Orbits: Globes and orbital soft tissues within normal limits. Paranasal sinuses are clear. No mastoid effusion. Inner ear structures grossly normal. Other: None. MRA HEAD FINDINGS ANTERIOR CIRCULATION: Both internal carotid arteries widely patent to the termini without stenosis. A1 segments widely patent. Normal anterior communicating artery complex. Both anterior cerebral arteries widely patent to their distal aspects without stenosis. No M1 stenosis or occlusion. Normal MCA bifurcations. Distal MCA branches well perfused and symmetric. POSTERIOR CIRCULATION: Both V4 segments patent to the vertebrobasilar junction without stenosis. Both PICA origins patent and normal. Basilar widely patent to its distal aspect without stenosis. Superior cerebellar arteries patent bilaterally. Both PCAs primarily supplied via the basilar and are well perfused to there distal aspects. No intracranial aneurysm or other vascular abnormality. MRA NECK FINDINGS AORTIC ARCH: Visualized aortic arch normal in caliber with normal 3 vessel morphology. No hemodynamically significant stenosis about the origin of the great vessels. Visualized subclavian arteries widely patent. RIGHT CAROTID SYSTEM: Right common and internal carotid arteries widely patent without stenosis, evidence for dissection or occlusion. LEFT CAROTID SYSTEM: Left common and internal carotid arteries widely patent without stenosis, evidence for  dissection or occlusion. VERTEBRAL ARTERIES: Both vertebral arteries arise from the subclavian arteries. No proximal subclavian artery stenosis. Both vertebral arteries widely patent without stenosis, evidence for dissection or occlusion. IMPRESSION: 1. Abnormal flow void within the posterosuperior sagittal sinus, extending into the left transverse and sigmoid sinuses, consistent with previously identified acute dural sinus thrombosis. No associated venous infarct, hemorrhage, or other complication. 2. Otherwise normal brain MRI. 3. Normal intracranial MRA. 4. Normal MRA of the neck. Electronically Signed   By: Rise Mu M.D.   On: 03/08/2021 21:14   MR ANGIO NECK W WO CONTRAST  Result Date: 03/08/2021 CLINICAL DATA:  Initial evaluation for intermittent headaches with blurry vision for 1.5 weeks. History of migraines. EXAM: MRI HEAD WITHOUT AND WITH CONTRAST MRA HEAD WITHOUT CONTRAST MRA NECK WITHOUT AND WITH CONTRAST TECHNIQUE: Multiplanar, multiecho pulse sequences of the brain and surrounding structures were obtained without and with intravenous contrast. Angiographic images of the Circle of Willis were obtained using MRA technique without intravenous contrast. Angiographic images of the neck were obtained using MRA technique without and with intravenous contrast. Carotid stenosis measurements (when applicable) are obtained utilizing NASCET criteria, using the distal internal carotid diameter as the denominator. CONTRAST:  55mL GADAVIST GADOBUTROL 1 MMOL/ML IV SOLN COMPARISON:  Prior CT from 03/07/2021. FINDINGS: MRI HEAD FINDINGS Brain: Cerebral volume within normal limits for patient age. No focal parenchymal signal abnormality identified. No abnormal foci of restricted diffusion to suggest acute or subacute ischemia. Gray-white matter differentiation well maintained. No encephalomalacia to suggest chronic cortical infarction. No evidence  for acute or chronic intracranial hemorrhage. No mass  lesion, midline shift or mass effect. No hydrocephalus or extra-axial fluid collection. Pituitary gland suprasellar region normal. Midline structures intact. No abnormal enhancement within the brain itself. Vascular: Abnormal flow void within the posterosuperior sagittal sinus near the torcula, extending into the left transverse and sigmoid sinuses, consistent with history of acute dural sinus thrombosis. Suspected associated small cortical vein thrombosis noted as well on gradient echo sequence (series 7, image 8). Associated reactive dural and leptomeningeal enhancement within this region. No associated gyral swelling or edema. No evidence for acute venous infarct or hemorrhage. Remainder of the major dural sinuses demonstrate normal flow voids. Major intracranial arterial flow voids are well maintained. Skull and upper cervical spine: Craniocervical junction within normal limits. Visualized upper cervical spine within normal limits. Bone marrow signal intensity normal. No focal marrow replacing lesion. No scalp soft tissue abnormality. Sinuses/Orbits: Globes and orbital soft tissues within normal limits. Paranasal sinuses are clear. No mastoid effusion. Inner ear structures grossly normal. Other: None. MRA HEAD FINDINGS ANTERIOR CIRCULATION: Both internal carotid arteries widely patent to the termini without stenosis. A1 segments widely patent. Normal anterior communicating artery complex. Both anterior cerebral arteries widely patent to their distal aspects without stenosis. No M1 stenosis or occlusion. Normal MCA bifurcations. Distal MCA branches well perfused and symmetric. POSTERIOR CIRCULATION: Both V4 segments patent to the vertebrobasilar junction without stenosis. Both PICA origins patent and normal. Basilar widely patent to its distal aspect without stenosis. Superior cerebellar arteries patent bilaterally. Both PCAs primarily supplied via the basilar and are well perfused to there distal aspects. No  intracranial aneurysm or other vascular abnormality. MRA NECK FINDINGS AORTIC ARCH: Visualized aortic arch normal in caliber with normal 3 vessel morphology. No hemodynamically significant stenosis about the origin of the great vessels. Visualized subclavian arteries widely patent. RIGHT CAROTID SYSTEM: Right common and internal carotid arteries widely patent without stenosis, evidence for dissection or occlusion. LEFT CAROTID SYSTEM: Left common and internal carotid arteries widely patent without stenosis, evidence for dissection or occlusion. VERTEBRAL ARTERIES: Both vertebral arteries arise from the subclavian arteries. No proximal subclavian artery stenosis. Both vertebral arteries widely patent without stenosis, evidence for dissection or occlusion. IMPRESSION: 1. Abnormal flow void within the posterosuperior sagittal sinus, extending into the left transverse and sigmoid sinuses, consistent with previously identified acute dural sinus thrombosis. No associated venous infarct, hemorrhage, or other complication. 2. Otherwise normal brain MRI. 3. Normal intracranial MRA. 4. Normal MRA of the neck. Electronically Signed   By: Rise Mu M.D.   On: 03/08/2021 21:14   MR BRAIN W WO CONTRAST  Result Date: 03/08/2021 CLINICAL DATA:  Initial evaluation for intermittent headaches with blurry vision for 1.5 weeks. History of migraines. EXAM: MRI HEAD WITHOUT AND WITH CONTRAST MRA HEAD WITHOUT CONTRAST MRA NECK WITHOUT AND WITH CONTRAST TECHNIQUE: Multiplanar, multiecho pulse sequences of the brain and surrounding structures were obtained without and with intravenous contrast. Angiographic images of the Circle of Willis were obtained using MRA technique without intravenous contrast. Angiographic images of the neck were obtained using MRA technique without and with intravenous contrast. Carotid stenosis measurements (when applicable) are obtained utilizing NASCET criteria, using the distal internal carotid  diameter as the denominator. CONTRAST:  67mL GADAVIST GADOBUTROL 1 MMOL/ML IV SOLN COMPARISON:  Prior CT from 03/07/2021. FINDINGS: MRI HEAD FINDINGS Brain: Cerebral volume within normal limits for patient age. No focal parenchymal signal abnormality identified. No abnormal foci of restricted diffusion to suggest acute or subacute  ischemia. Gray-white matter differentiation well maintained. No encephalomalacia to suggest chronic cortical infarction. No evidence for acute or chronic intracranial hemorrhage. No mass lesion, midline shift or mass effect. No hydrocephalus or extra-axial fluid collection. Pituitary gland suprasellar region normal. Midline structures intact. No abnormal enhancement within the brain itself. Vascular: Abnormal flow void within the posterosuperior sagittal sinus near the torcula, extending into the left transverse and sigmoid sinuses, consistent with history of acute dural sinus thrombosis. Suspected associated small cortical vein thrombosis noted as well on gradient echo sequence (series 7, image 8). Associated reactive dural and leptomeningeal enhancement within this region. No associated gyral swelling or edema. No evidence for acute venous infarct or hemorrhage. Remainder of the major dural sinuses demonstrate normal flow voids. Major intracranial arterial flow voids are well maintained. Skull and upper cervical spine: Craniocervical junction within normal limits. Visualized upper cervical spine within normal limits. Bone marrow signal intensity normal. No focal marrow replacing lesion. No scalp soft tissue abnormality. Sinuses/Orbits: Globes and orbital soft tissues within normal limits. Paranasal sinuses are clear. No mastoid effusion. Inner ear structures grossly normal. Other: None. MRA HEAD FINDINGS ANTERIOR CIRCULATION: Both internal carotid arteries widely patent to the termini without stenosis. A1 segments widely patent. Normal anterior communicating artery complex. Both anterior  cerebral arteries widely patent to their distal aspects without stenosis. No M1 stenosis or occlusion. Normal MCA bifurcations. Distal MCA branches well perfused and symmetric. POSTERIOR CIRCULATION: Both V4 segments patent to the vertebrobasilar junction without stenosis. Both PICA origins patent and normal. Basilar widely patent to its distal aspect without stenosis. Superior cerebellar arteries patent bilaterally. Both PCAs primarily supplied via the basilar and are well perfused to there distal aspects. No intracranial aneurysm or other vascular abnormality. MRA NECK FINDINGS AORTIC ARCH: Visualized aortic arch normal in caliber with normal 3 vessel morphology. No hemodynamically significant stenosis about the origin of the great vessels. Visualized subclavian arteries widely patent. RIGHT CAROTID SYSTEM: Right common and internal carotid arteries widely patent without stenosis, evidence for dissection or occlusion. LEFT CAROTID SYSTEM: Left common and internal carotid arteries widely patent without stenosis, evidence for dissection or occlusion. VERTEBRAL ARTERIES: Both vertebral arteries arise from the subclavian arteries. No proximal subclavian artery stenosis. Both vertebral arteries widely patent without stenosis, evidence for dissection or occlusion. IMPRESSION: 1. Abnormal flow void within the posterosuperior sagittal sinus, extending into the left transverse and sigmoid sinuses, consistent with previously identified acute dural sinus thrombosis. No associated venous infarct, hemorrhage, or other complication. 2. Otherwise normal brain MRI. 3. Normal intracranial MRA. 4. Normal MRA of the neck. Electronically Signed   By: Rise Mu M.D.   On: 03/08/2021 21:14   ECHOCARDIOGRAM COMPLETE  Result Date: 03/08/2021    ECHOCARDIOGRAM REPORT   Patient Name:   TEDDIE CURD Date of Exam: 03/08/2021 Medical Rec #:  [id number]       Height:       62.0 in Accession #:    [id number]      Weight:        196.1 lb Date of Birth:  1976/11/28       BSA:          1.896 m Patient Age:    45 years        BP:           113/79 mmHg Patient Gender: F               HR:  81 bpm. Exam Location:  Inpatient Procedure: 2D Echo, Cardiac Doppler and Color Doppler Indications:    Stroke  History:        Patient has no prior history of Echocardiogram examinations.  Sonographer:    Roosvelt Maser RDCS Referring Phys: [id number] JINDONG XU IMPRESSIONS  1. Left ventricular ejection fraction, by estimation, is 55 to 60%. The left ventricle has normal function. The left ventricle has no regional wall motion abnormalities. Left ventricular diastolic parameters are consistent with Grade II diastolic dysfunction (pseudonormalization).  2. Right ventricular systolic function is normal. The right ventricular size is normal.  3. Left atrial size was mildly dilated.  4. The mitral valve is normal in structure. Trivial mitral valve regurgitation. No evidence of mitral stenosis.  5. The aortic valve is normal in structure. Aortic valve regurgitation is not visualized. No aortic stenosis is present.  6. The inferior vena cava is normal in size with greater than 50% respiratory variability, suggesting right atrial pressure of 3 mmHg. FINDINGS  Left Ventricle: Left ventricular ejection fraction, by estimation, is 55 to 60%. The left ventricle has normal function. The left ventricle has no regional wall motion abnormalities. The left ventricular internal cavity size was normal in size. There is  no left ventricular hypertrophy. Left ventricular diastolic parameters are consistent with Grade II diastolic dysfunction (pseudonormalization). Right Ventricle: The right ventricular size is normal. No increase in right ventricular wall thickness. Right ventricular systolic function is normal. Left Atrium: Left atrial size was mildly dilated. Right Atrium: Right atrial size was normal in size. Pericardium: There is no evidence of pericardial effusion.  Mitral Valve: The mitral valve is normal in structure. Trivial mitral valve regurgitation. No evidence of mitral valve stenosis. Tricuspid Valve: The tricuspid valve is normal in structure. Tricuspid valve regurgitation is not demonstrated. No evidence of tricuspid stenosis. Aortic Valve: The aortic valve is normal in structure. Aortic valve regurgitation is not visualized. No aortic stenosis is present. Pulmonic Valve: The pulmonic valve was normal in structure. Pulmonic valve regurgitation is not visualized. No evidence of pulmonic stenosis. Aorta: The aortic root is normal in size and structure. Venous: The inferior vena cava is normal in size with greater than 50% respiratory variability, suggesting right atrial pressure of 3 mmHg. IAS/Shunts: No atrial level shunt detected by color flow Doppler.  LEFT VENTRICLE PLAX 2D LVIDd:         4.20 cm  Diastology LVIDs:         3.10 cm  LV e' medial:    9.36 cm/s LV PW:         1.00 cm  LV E/e' medial:  9.2 LV IVS:        0.70 cm  LV e' lateral:   9.79 cm/s LVOT diam:     1.70 cm  LV E/e' lateral: 8.8 LV SV:         40 LV SV Index:   21 LVOT Area:     2.27 cm  RIGHT VENTRICLE          IVC RV Basal diam:  3.80 cm  IVC diam: 1.20 cm LEFT ATRIUM             Index       RIGHT ATRIUM           Index LA diam:        3.50 cm 1.85 cm/m  RA Area:     13.10 cm LA Vol (A2C):   53.6 ml 28.27 ml/m  RA Volume:   33.80 ml  17.83 ml/m LA Vol (A4C):   52.0 ml 27.43 ml/m LA Biplane Vol: 57.4 ml 30.28 ml/m  AORTIC VALVE LVOT Vmax:   86.20 cm/s LVOT Vmean:  60.800 cm/s LVOT VTI:    0.177 m  AORTA Ao Root diam: 2.45 cm Ao Asc diam:  2.20 cm MITRAL VALVE MV Area (PHT): 5.38 cm    SHUNTS MV Decel Time: 141 msec    Systemic VTI:  0.18 m MV E velocity: 85.70 cm/s  Systemic Diam: 1.70 cm MV A velocity: 65.60 cm/s MV E/A ratio:  1.31 Mihai Croitoru MD Electronically signed by Thurmon Fair MD Signature Date/Time: 03/08/2021/3:01:39 PM    Final    PHYSICAL EXAM  Temp:  [97.8 F (36.6  C)-98.6 F (37 C)] 98.1 F (36.7 C) (03/26 0725) Pulse Rate:  [82-99] 83 (03/26 0725) Resp:  [13-22] 15 (03/26 0725) BP: (115-135)/(78-89) 115/89 (03/26 0725) SpO2:  [97 %-99 %] 99 % (03/26 0725)  General - Well nourished, well developed middle aged female, in no apparent distress.   Cardiovascular - Regular rhythm and rate.   Mental Status -  Level of arousal and orientation to time, place,situation and person were intact.  Language including expression, naming, repetition, comprehension was assessed and found intact. Attention span and concentration were normal. Recent and remote memory were intact.   Cranial Nerves II - XII - II - Visual field intact OU. III, IV, VI - Extraocular movements intact. V - Facial sensation intact bilaterally. VII - Facial movement intact bilaterally. VIII - Hearing & vestibular intact bilaterally. X - Palate elevates symmetrically. XI - Chin turning & shoulder shrug intact bilaterally. XII - Tongue protrusion intact.  Motor Strength - The patient's strength was normal in all extremities and pronator drift was absent.  Bulk was normal and fasciculations were absent.   Motor Tone - Muscle tone was assessed at the neck and appendages and was normal. .  Sensory - Light touch was assessed in all extremities x4 and was symmetrical.    Coordination - The patient had normal movements in the hands and feet with no ataxia or dysmetria.  Tremor was absent.  Gait and Station - deferred.  ASSESSMENT/PLAN HEDDY VIDANA is a 45 y.o. female past medical history of anxiety, depression, PTSD, prior ectopic pregnancies, migraine in the past with last migrainous headache 3 to 4 years prior to admission, presented to the emergency room ([facility]) for evaluation of headaches and blurred vision that started sometime Sunday evening and did not get better and continued to get worse. She describes the headache as a sense of pressure on her forehead as well as left temple  and left back of the head along with some pain in her neck as well. She was evaluated by the ED provider. A CT venogram was obtained that showed dural venous sinus thrombosis. No focal neurologic deficits upon presentation.  She was placed on heparin drip and transferred to [facility] for further neurologic evaluation and care.   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 Near occlusive thrombus throughout the left transverse and sigmoid sinuses.  MRI Brain 03/08/21  1. Abnormal flow void within the posterosuperior sagittal sinus, extending into the left transverse and sigmoid sinuses, consistent with previously identified acute dural sinus thrombosis. No associated  venous infarct, hemorrhage, or other complication. 2. Otherwise normal brain MRI. 3. Normal intracranial MRA. 4. Normal MRA of the neck.   2D Echo EF 55-60%  Hypercoagulabe labs are pending (drawn prior to heparin)  LDL 111  HgbA1c 6.0 VTE prophylaxis - on heparin gtt  No anticoagulant/antiplatelet prior to admission, currently on heparin drip. If tolerating heparin IV on 03/11/21 consider to switch to DOAC before discharge.  Therapy recommendations:  pending  Dispositio/n:  TBD / Migraine   Not have migraine for the last 3 years  Typical migraine symptoms including headache, photophobia, phonophobia, nausea vomiting  This time headache resolved photophobia, phonophobia nausea vomiting.  Hyperlipidemia  Home meds: None   LDL 111, not at goal < 70  On Lipitor 40mg  daily.  Continue statin at discharge   , PA-C Above discussed with Dr. Suzan Garibaldi.    Hospital day # 3  This patient is critically ill and at significant risk of neurological worsening, death form severe anemia, bleeding, recurrent stroke,  intracranial stenosis. This patient's care requires constant monitoring of vital signs, hemodynamics, respiratory and cardiac monitoring, review of multiple databases, neurological assessment, other specialists and medical decision making of high complexity. I spent 35 minutes of neurocritical care time in the care of this patient.   Dr. Gerilyn Pilgrim -- The patient was seen and examined by me; notes, chart and tests reviewed and discussed with the practice provider, other providers, patient, and family.

## 2021-03-09 NOTE — Progress Notes (Signed)
ANTICOAGULATION CONSULT NOTE Pharmacy Consult for heparin Indication: dural venous sinus thrombosis  No Known Allergies  Patient Measurements: Height: 5\' 2"  (157.5 cm) Weight: 89 kg (196 lb 1.6 oz) IBW/kg (Calculated) : 50.1 Heparin Dosing Weight: 69.4 kg  Vital Signs: Temp: 98.4 F (36.9 C) (03/25 2310) Temp Source: Oral (03/25 2310) BP: 125/79 (03/25 2310) Pulse Rate: 92 (03/25 2310)  Labs: Recent Labs    03/07/21 1819 03/08/21 0335 03/08/21 0529 03/08/21 1056 03/09/21 0128  HGB 11.9* 11.0*  --   --  11.2*  HCT 36.4 33.9*  --   --  34.0*  PLT 264 254  --   --  310  HEPARINUNFRC  --   --  0.42 0.42 0.15*  CREATININE 0.91 0.76  --   --   --     Estimated Creatinine Clearance: 90.2 mL/min (by C-G formula based on SCr of 0.76 mg/dL).    Assessment: 45 yo female admitted with headache, found to have dural venous sinus thrombosis, for heparin  Goal of Therapy:  Heparin level 0.3-0.5 units/mL Monitor platelets by anticoagulation protocol: Yes   Plan:  Increase Heparin 1300 units/hr Check heparin level in 8 hours.   57, PharmD, BCPS  03/09/2021 3:24 AM

## 2021-03-09 NOTE — Progress Notes (Signed)
Jasmine [name]  YIA:[id number] DOB: December 28, 1975 DOA: 03/07/2021 PCP: Farris Has, MD    Brief Narrative:  [phone] with a history of anxiety/depression, PTSD, and migraine headaches who presented to the ER with severe left temporal headache (different than her last remote migraine) and blurry vision which had persisted over a week.  In the ER CT venogram revealed dural venous sinus thrombosis.  Significant Events:  3/24 admit via [facility] 3/24 CT venogram head - dural venous sinus thrombosis  3/25 TTE - EF 55-60% - grade 2 DD 3/25 MRI/A - noted dural venous sinus thrombosis but o/w normal MRI & MRA brain + neck  Antimicrobials:  None  DVT prophylaxis: IV heparin  Consultants:  Neurology  Code Status: FULL CODE  Subjective: Afebrile.  Vital signs stable.  Resting comfortably in bed.  Has no new complaints.  States her headache is much improved.  Denies focal neurologic deficits.  Assessment & Plan:  Acute dural venous sinus thrombosis Etiology unclear - Neurology directing treatment -continue IV heparin with plan to transition to DOAC at time of discharge - hypercoagulable work-up still pending  HLD LDL 111 -goal less than 70 -Lipitor added this admission  Elevated A1c A1c 6.0 in preDM range - discuss w/ pt   Anxiety/depression Continue home Lexapro  Normocytic anemia  Likely simply related to menstrual loss    Family Communication:  Status is: Inpatient  Remains inpatient appropriate because:Inpatient level of care appropriate due to severity of illness   Dispo: The patient is from: Home              Anticipated d/c is to: Home              Patient currently is not medically stable to d/c.   Difficult to place patient No    Objective: Blood pressure 115/89, pulse 83, temperature 98.1 F (36.7 C), temperature source Oral, resp. rate 15, height 5\' 2"  (1.575 m), weight 89 kg, SpO2 99 %.  Intake/Output Summary (Last 24 hours) at 03/09/2021 0801 Last data filed at  03/09/2021 0549 Gross per 24 hour  Intake 563.75 ml  Output --  Net 563.75 ml   Filed Weights   03/07/21 1714 03/07/21 2026  Weight: 85.3 kg 89 kg    Examination: General: No acute respiratory distress Lungs: CTA B without wheeze or crackles Cardiovascular: RRR without murmur Abdomen: NT/ND, soft, BS positive Extremities: No edema B LE  CBC: Recent Labs  Lab 03/07/21 1819 03/08/21 0335 03/09/21 0128  WBC 5.8 6.6 5.7  NEUTROABS 3.6  --   --   HGB 11.9* 11.0* 11.2*  HCT 36.4 33.9* 34.0*  MCV 91.2 91.1 91.4  PLT 264 254 310   Basic Metabolic Panel: Recent Labs  Lab 03/07/21 1819 03/08/21 0335  NA 136 136  K 3.9 3.6  CL 103 107  CO2 23 21*  GLUCOSE 120* 102*  BUN 12 8  CREATININE 0.91 0.76  CALCIUM 8.5* 8.0*   GFR: Estimated Creatinine Clearance: 90.2 mL/min (by C-G formula based on SCr of 0.76 mg/dL).  Liver Function Tests: No results for input(s): AST, ALT, ALKPHOS, BILITOT, PROT, ALBUMIN in the last 168 hours. No results for input(s): LIPASE, AMYLASE in the last 168 hours. No results for input(s): AMMONIA in the last 168 hours.  Coagulation Profile: No results for input(s): INR, PROTIME in the last 168 hours.   Recent Results (from the past 240 hour(s))  Resp Panel by RT-PCR (Flu A&B, Covid) Nasopharyngeal Swab  Status: None   Collection Time: 03/07/21  8:07 PM   Specimen: Nasopharyngeal Swab; Nasopharyngeal(NP) swabs in vial transport medium  Result Value Ref Range Status   SARS Coronavirus 2 by RT PCR NEGATIVE NEGATIVE Final    Comment: (NOTE) SARS-CoV-2 target nucleic acids are NOT DETECTED.  The SARS-CoV-2 RNA is generally detectable in upper respiratory specimens during the acute phase of infection. The lowest concentration of SARS-CoV-2 viral copies this assay can detect is 138 copies/mL. A negative result does not preclude SARS-Cov-2 infection and should not be used as the sole basis for treatment or other patient management decisions. A  negative result may occur with  improper specimen collection/handling, submission of specimen other than nasopharyngeal swab, presence of viral mutation(s) within the areas targeted by this assay, and inadequate number of viral copies(<138 copies/mL). A negative result must be combined with clinical observations, patient history, and epidemiological information. The expected result is Negative.  Fact Sheet for Patients:  BloggerCourse.com  Fact Sheet for Healthcare Providers:  SeriousBroker.it  This test is no t yet approved or cleared by the [place] FDA and  has been authorized for detection and/or diagnosis of SARS-CoV-2 by FDA under an Emergency Use Authorization (EUA). This EUA will remain  in effect (meaning this test can be used) for the duration of the COVID-19 declaration under Section 564(b)(1) of the Act, 21 U.S.C.section 360bbb-3(b)(1), unless the authorization is terminated  or revoked sooner.       Influenza A by PCR NEGATIVE NEGATIVE Final   Influenza B by PCR NEGATIVE NEGATIVE Final    Comment: (NOTE) The Xpert Xpress SARS-CoV-2/FLU/RSV plus assay is intended as an aid in the diagnosis of influenza from Nasopharyngeal swab specimens and should not be used as a sole basis for treatment. Nasal washings and aspirates are unacceptable for Xpert Xpress SARS-CoV-2/FLU/RSV testing.  Fact Sheet for Patients: BloggerCourse.com  Fact Sheet for Healthcare Providers: SeriousBroker.it  This test is not yet approved or cleared by the [place] FDA and has been authorized for detection and/or diagnosis of SARS-CoV-2 by FDA under an Emergency Use Authorization (EUA). This EUA will remain in effect (meaning this test can be used) for the duration of the COVID-19 declaration under Section 564(b)(1) of the Act, 21 U.S.C. section 360bbb-3(b)(1), unless the authorization  is terminated or revoked.  Performed at [facility], [address] [place], [place] [address]   MRSA PCR Screening     Status: None   Collection Time: 03/07/21 11:25 PM   Specimen: Nasal Mucosa; Nasopharyngeal  Result Value Ref Range Status   MRSA by PCR NEGATIVE NEGATIVE Final    Comment:        The GeneXpert MRSA Assay (FDA approved for NASAL specimens only), is one component of a comprehensive MRSA colonization surveillance program. It is not intended to diagnose MRSA infection nor to guide or monitor treatment for MRSA infections. Performed at [facility], [address], [place], [place] [address]      Scheduled Meds: . atorvastatin  40 mg Oral Daily  . escitalopram  5 mg Oral Daily   Continuous Infusions: . heparin 1,300 Units/hr (03/09/21 0328)     LOS: 2 days   Lonia Blood, MD [facility] Office  [phone] Pager - Text Page per Amion  If 7PM-7AM, please contact night-coverage per Amion 03/09/2021, 8:01 AM

## 2021-03-10 LAB — CBC
HCT: 33.5 % — ABNORMAL LOW (ref 36.0–46.0)
Hemoglobin: 11.6 g/dL — ABNORMAL LOW (ref 12.0–15.0)
MCH: 30.9 pg (ref 26.0–34.0)
MCHC: 34.6 g/dL (ref 30.0–36.0)
MCV: 89.1 fL (ref 80.0–100.0)
Platelets: 327 10*3/uL (ref 150–400)
RBC: 3.76 MIL/uL — ABNORMAL LOW (ref 3.87–5.11)
RDW: 13.5 % (ref 11.5–15.5)
WBC: 6.7 10*3/uL (ref 4.0–10.5)
nRBC: 0 % (ref 0.0–0.2)

## 2021-03-10 LAB — HEPARIN LEVEL (UNFRACTIONATED): Heparin Unfractionated: 0.41 IU/mL (ref 0.30–0.70)

## 2021-03-10 LAB — LUPUS ANTICOAGULANT PANEL
DRVVT: 57.3 s — ABNORMAL HIGH (ref 0.0–47.0)
PTT Lupus Anticoagulant: 29.7 s (ref 0.0–51.9)

## 2021-03-10 LAB — DRVVT CONFIRM: dRVVT Confirm: 1.5 ratio — ABNORMAL HIGH (ref 0.8–1.2)

## 2021-03-10 LAB — PROTEIN C, TOTAL: Protein C, Total: 95 % (ref 60–150)

## 2021-03-10 LAB — BETA-2-GLYCOPROTEIN I ABS, IGG/M/A
Beta-2 Glyco I IgG: <9 GPI IgG units (ref 0–20)
Beta-2-Glycoprotein I IgA: <9 GPI IgA units (ref 0–25)
Beta-2-Glycoprotein I IgM: <9 GPI IgM units (ref 0–32)

## 2021-03-10 LAB — DRVVT MIX: dRVVT Mix: 44.2 s — ABNORMAL HIGH (ref 0.0–40.4)

## 2021-03-10 MED ORDER — APIXABAN 5 MG PO TABS
10.0000 mg | ORAL_TABLET | Freq: Two times a day (BID) | ORAL | Status: DC
  Administered 2021-03-11: 10 mg via ORAL
  Filled 2021-03-10: qty 2

## 2021-03-10 MED ORDER — APIXABAN 5 MG PO TABS: 5.0000 mg | ORAL_TABLET | Freq: Two times a day (BID) | ORAL | Status: DC

## 2021-03-10 NOTE — Progress Notes (Addendum)
ANTICOAGULATION CONSULT NOTE Pharmacy Consult for heparin Indication: dural venous sinus thrombosis  No Known Allergies  Patient Measurements: Height: 5\' 2"  (157.5 cm) Weight: 89 kg (196 lb 1.6 oz) IBW/kg (Calculated) : 50.1 Heparin Dosing Weight: 69.4 kg  Vital Signs: Temp: 98.5 F (36.9 C) (03/27 0827) Temp Source: Oral (03/27 0419) BP: 119/84 (03/27 0827) Pulse Rate: 76 (03/27 0827)  Labs: Recent Labs    03/07/21 1819 03/08/21 0335 03/08/21 0529 03/09/21 0128 03/09/21 1228 03/10/21 0028  HGB 11.9* 11.0*  --  11.2*  --  11.6*  HCT 36.4 33.9*  --  34.0*  --  33.5*  PLT 264 254  --  310  --  327  HEPARINUNFRC  --   --    < > 0.15* 0.38 0.41  CREATININE 0.91 0.76  --   --   --   --    < > = values in this interval not displayed.    Estimated Creatinine Clearance: 90.2 mL/min (by C-G formula based on SCr of 0.76 mg/dL).   Assessment: 45 yo female admitted with headache, found to have dural venous sinus thrombosis and continuing on heparin drip. Patient is not on anticoagulation PTA. Heparin level remains therapeutic. CBC wnl. No active bleed issues reported.  Goal of Therapy:  Heparin level 0.3-0.5 units/mL Monitor platelets by anticoagulation protocol: Yes   Plan:  Continue heparin drip at 1300 units/hr Monitor daily heparin level and CBC, s/sx bleeding Consider switch to DOAC 3/28 prior to discharge if tolerating per Neuro   4/28, PharmD, BCPS Please check AMION for all [facility] contact numbers Clinical Pharmacist 03/10/2021 10:14 AM    ADDENDUM - Pharmacy consulted to transition heparin to apixaban starting 3/28 AM. Ok to load patient with apixaban per discussion with Dr. 4/28. No active bleed issues reported.  Plan: Apixaban 10mg  PO BID x 7 days; then 5mg  PO BID (to start 3/28 at 1000) D/c heparin drip at time of 1st dose of apixaban 3/28 AM - stop time entered Monitor CBC, s/sx bleeding   , PharmD, BCPS Please check  AMION for all [facility] contact numbers Clinical Pharmacist 03/10/2021 2:59 PM

## 2021-03-10 NOTE — Progress Notes (Addendum)
STROKE TEAM PROGRESS NOTE  HPI:  Jasmine [name] is a 45 y.o. female past medical history of anxiety, depression, PTSD, prior ectopic pregnancies, migraine in the past with last migrainous headache 3 to 4 years ago, presented to the emergency room for evaluation of headaches and blurred vision that started sometime Sunday evening and did not get better and continued to get worse. She describes the headache as a sense of pressure on her forehead as well as left temple and left back of the head along with some pain in her neck as well.  She was evaluated by the ED provider.  A CT venogram was obtained that showed dural venous sinus thrombosis. She was placed on heparin drip. No neurologic deficits were apparent.   INTERVAL HISTORY No acute events overnight. Seen by PT with no follow up  or equipment recommended.  BP stable with mild elevation to 140s.. Afebrile. Neurologically intact and stable.  Denies headaches today.  She denies new symptoms of concern and specifically denies vision changes, dizziness, nausea, numbness/tingling, weakness.      Vitals:   03/09/21 2000 03/10/21 0040 03/10/21 0419 03/10/21 0827  BP: 127/78 129/80 136/86 119/84  Pulse: 88 80 70 76  Resp:      Temp: 98.9 F (37.2 C) 98.7 F (37.1 C) 98.5 F (36.9 C) 98.5 F (36.9 C)  TempSrc: Oral Oral Oral   SpO2: 96% 97% 97% 98%  Weight:      Height:       CBC:  Recent Labs  Lab 03/07/21 1819 03/08/21 0335 03/09/21 0128 03/10/21 0028  WBC 5.8   < > 5.7 6.7  NEUTROABS 3.6  --   --   --   HGB 11.9*   < > 11.2* 11.6*  HCT 36.4   < > 34.0* 33.5*  MCV 91.2   < > 91.4 89.1  PLT 264   < > 310 327   < > = values in this interval not displayed.   Basic Metabolic Panel:  Recent Labs  Lab 03/07/21 1819 03/08/21 0335  NA 136 136  K 3.9 3.6  CL 103 107  CO2 23 21*  GLUCOSE 120* 102*  BUN 12 8  CREATININE 0.91 0.76  CALCIUM 8.5* 8.0*   Lipid Panel:  Recent Labs  Lab 03/08/21 1056  CHOL 168  TRIG 61   HDL 45  CHOLHDL 3.7  VLDL 12  LDLCALC 409*   HgbA1c:  Recent Labs  Lab 03/08/21 1056  HGBA1C 6.0*    IMAGING past 24 hours No results found. PHYSICAL EXAM  Temp:  [97.7 F (36.5 C)-98.9 F (37.2 C)] 98.5 F (36.9 C) (03/27 0827) Pulse Rate:  [70-88] 76 (03/27 0827) Resp:  [18] 18 (03/26 1600) BP: (119-139)/(78-90) 119/84 (03/27 0827) SpO2:  [96 %-98 %] 98 % (03/27 0827)  General - Well nourished, well developed middle aged female, in no apparent distress.   Cardiovascular - Regular rhythm and rate.   Mental Status -  Level of arousal and orientation to time, place,situation and person were intact.  Language including expression, naming, repetition, comprehension was assessed and found intact. Attention span and concentration were normal. Recent and remote memory were intact.   Cranial Nerves II - XII - II - Visual field intact OU. III, IV, VI - Extraocular movements intact. V - Facial sensation intact bilaterally. VII - Facial movement intact bilaterally. VIII - Hearing & vestibular intact bilaterally. X - Palate elevates symmetrically. XI - Chin turning & shoulder shrug  intact bilaterally. XII - Tongue protrusion intact.  Motor Strength - The patients strength was normal in all extremities and pronator drift was absent.  Bulk was normal and fasciculations were absent.   Motor Tone - Muscle tone was assessed at the neck and appendages and was normal. .  Sensory - Light touch was assessed in all extremities x4 and was symmetrical.    Coordination - The patient had normal movements in the hands and feet with no ataxia or dysmetria.  Tremor was absent.  Gait and Station - deferred.  ASSESSMENT/PLAN Jasmine [name] is a 45 y.o. female past medical history of anxiety, depression, PTSD, prior ectopic pregnancies, migraine in the past with last migrainous headache 3 to 4 years prior to admission, presented to the emergency room ([facility] ER) for evaluation of headaches  and blurred vision that started sometime Sunday evening and did not get better and continued to get worse. She describes the headache as a sense of pressure on her forehead as well as left temple and left back of the head along with some pain in her neck as well. She was evaluated by the ED provider. A CT venogram was obtained that showed dural venous sinus thrombosis. No focal neurologic deficits upon presentation.  She was placed on heparin drip and transferred to [facility] for further neurologic evaluation and care.   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 Near occlusive thrombus throughout the left transverse and sigmoid sinuses.   MRI Brain 03/08/21  1. Abnormal flow void within the posterosuperior sagittal sinus, extending into the left transverse and sigmoid sinuses, consistent with previously identified acute dural sinus thrombosis. No associated venous infarct, hemorrhage, or other complication. 2. Otherwise normal brain MRI. 3. Normal intracranial MRA. 4. Normal MRA of the neck.   2D Echo EF 55-60%  Hypercoagulabe labs are pending (drawn prior to heparin)  LDL 111  HgbA1c 6.0 VTE prophylaxis - on heparin gtt. Heparin levels monitored daily; 0.41 today.   No anticoagulant/antiplatelet prior to admission, currently on heparin drip. If tolerating heparin IV on 03/11/21 consider to switch to DOAC before discharge.  Therapy recommendations:  No follow up.   Dispositio/n:  home  Migraine   Not have migraine for the last 3 years  Typical migraine symptoms including headache, photophobia, phonophobia, nausea vomiting  This time headache resolved photophobia, phonophobia nausea vomiting.  Hyperlipidemia  Home meds: None   LDL 111, not at goal < 70  On Lipitor 40mg  daily.  Continue  statin at discharge  [facility] to transfer out of ICU today from neuro standpoint.    [facility], PA-C Above discussed with Dr. Suzan Garibaldi.    Hospital day # 4  . Dr. Gerilyn Pilgrim -- The patient was seen and examined by me; notes, chart and tests reviewed and discussed with the practice provider, other providers, patient, and family.

## 2021-03-10 NOTE — Progress Notes (Signed)
Jasmine [name]  OBS:[id number] DOB: 1976/06/27 DOA: 03/07/2021 PCP: Farris Has, MD    Brief Narrative:  [phone] with a history of anxiety/depression, PTSD, and migraine headaches who presented to the ER with severe left temporal headache (different than her last remote migraine) and blurry vision which had persisted over a week.  In the ER CT venogram revealed dural venous sinus thrombosis.  Significant Events:  3/24 admit via [facility] 3/24 CT venogram head - dural venous sinus thrombosis  3/25 TTE - EF 55-60% - grade 2 DD 3/25 MRI/A - noted dural venous sinus thrombosis but o/w normal MRI & MRA brain + neck  Antimicrobials:  None  DVT prophylaxis: IV heparin  Consultants:  Neurology  Code Status: FULL CODE  Subjective: Blood pressure control.  Vital signs stable.  Afebrile.  No new complaints.  Headache fully resolved.  No focal neurologic deficits.  Assessment & Plan:  Acute dural venous sinus thrombosis Etiology unclear - Neurology directing treatment - continue IV heparin with plan to transition to DOAC at time of discharge - hypercoagulable work-up pending  HLD LDL 111 -goal less than 70 -Lipitor added this admission  Elevated A1c A1c 6.0 in preDM range - discussed w/ pt and advised modified diet and exercise to bring about weight loss to avoid progression to DM  Anxiety/depression Continue home Lexapro  Normocytic anemia  Likely simply related to menstrual loss - check Fe studies   Class 2 Obesity - Body mass index is 35.87 kg/m.    Family Communication:  Status is: Inpatient  Remains inpatient appropriate because:Inpatient level of care appropriate due to severity of illness   Dispo: The patient is from: Home              Anticipated d/c is to: Home              Patient currently is not medically stable to d/c.   Difficult to place patient No    Objective: Blood pressure 136/86, pulse 70, temperature 98.5 F (36.9 C), temperature source Oral, resp.  rate 18, height 5\' 2"  (1.575 m), weight 89 kg, SpO2 97 %.  Intake/Output Summary (Last 24 hours) at 03/10/2021 0755 Last data filed at 03/09/2021 1430 Gross per 24 hour  Intake 480 ml  Output -  Net 480 ml   Filed Weights   03/07/21 1714 03/07/21 2026  Weight: 85.3 kg 89 kg    Examination: General: No acute respiratory distress -intact sensation intact throughout -cranial nerves II-XII intact bilaterally Lungs: CTA B - no wheezing  Cardiovascular: RRR - no M or rub  Abdomen: NT/ND, soft, BS positive Extremities: No edema B lower extremities  CBC: Recent Labs  Lab 03/07/21 1819 03/08/21 0335 03/09/21 0128 03/10/21 0028  WBC 5.8 6.6 5.7 6.7  NEUTROABS 3.6  --   --   --   HGB 11.9* 11.0* 11.2* 11.6*  HCT 36.4 33.9* 34.0* 33.5*  MCV 91.2 91.1 91.4 89.1  PLT 264 254 310 327   Basic Metabolic Panel: Recent Labs  Lab 03/07/21 1819 03/08/21 0335  NA 136 136  K 3.9 3.6  CL 103 107  CO2 23 21*  GLUCOSE 120* 102*  BUN 12 8  CREATININE 0.91 0.76  CALCIUM 8.5* 8.0*   GFR: Estimated Creatinine Clearance: 90.2 mL/min (by C-G formula based on SCr of 0.76 mg/dL).  Liver Function Tests: No results for input(s): AST, ALT, ALKPHOS, BILITOT, PROT, ALBUMIN in the last 168 hours. No results for input(s): LIPASE, AMYLASE in  the last 168 hours. No results for input(s): AMMONIA in the last 168 hours.   Recent Results (from the past 240 hour(s))  Resp Panel by RT-PCR (Flu A&B, Covid) Nasopharyngeal Swab     Status: None   Collection Time: 03/07/21  8:07 PM   Specimen: Nasopharyngeal Swab; Nasopharyngeal(NP) swabs in vial transport medium  Result Value Ref Range Status   SARS Coronavirus 2 by RT PCR NEGATIVE NEGATIVE Final    Comment: (NOTE) SARS-CoV-2 target nucleic acids are NOT DETECTED.  The SARS-CoV-2 RNA is generally detectable in upper respiratory specimens during the acute phase of infection. The lowest concentration of SARS-CoV-2 viral copies this assay can detect  is 138 copies/mL. A negative result does not preclude SARS-Cov-2 infection and should not be used as the sole basis for treatment or other patient management decisions. A negative result may occur with  improper specimen collection/handling, submission of specimen other than nasopharyngeal swab, presence of viral mutation(s) within the areas targeted by this assay, and inadequate number of viral copies(<138 copies/mL). A negative result must be combined with clinical observations, patient history, and epidemiological information. The expected result is Negative.  Fact Sheet for Patients:  BloggerCourse.com  Fact Sheet for Healthcare Providers:  SeriousBroker.it  This test is no t yet approved or cleared by the [place] FDA and  has been authorized for detection and/or diagnosis of SARS-CoV-2 by FDA under an Emergency Use Authorization (EUA). This EUA will remain  in effect (meaning this test can be used) for the duration of the COVID-19 declaration under Section 564(b)(1) of the Act, 21 U.S.C.section 360bbb-3(b)(1), unless the authorization is terminated  or revoked sooner.       Influenza A by PCR NEGATIVE NEGATIVE Final   Influenza B by PCR NEGATIVE NEGATIVE Final    Comment: (NOTE) The Xpert Xpress SARS-CoV-2/FLU/RSV plus assay is intended as an aid in the diagnosis of influenza from Nasopharyngeal swab specimens and should not be used as a sole basis for treatment. Nasal washings and aspirates are unacceptable for Xpert Xpress SARS-CoV-2/FLU/RSV testing.  Fact Sheet for Patients: BloggerCourse.com  Fact Sheet for Healthcare Providers: SeriousBroker.it  This test is not yet approved or cleared by the [place] FDA and has been authorized for detection and/or diagnosis of SARS-CoV-2 by FDA under an Emergency Use Authorization (EUA). This EUA will remain in effect  (meaning this test can be used) for the duration of the COVID-19 declaration under Section 564(b)(1) of the Act, 21 U.S.C. section 360bbb-3(b)(1), unless the authorization is terminated or revoked.  Performed at [facility], [address] [place], [place] [address]   MRSA PCR Screening     Status: None   Collection Time: 03/07/21 11:25 PM   Specimen: Nasal Mucosa; Nasopharyngeal  Result Value Ref Range Status   MRSA by PCR NEGATIVE NEGATIVE Final    Comment:        The GeneXpert MRSA Assay (FDA approved for NASAL specimens only), is one component of a comprehensive MRSA colonization surveillance program. It is not intended to diagnose MRSA infection nor to guide or monitor treatment for MRSA infections. Performed at [facility], [address], [place], [place] [address]      Scheduled Meds: . atorvastatin  40 mg Oral Daily  . escitalopram  5 mg Oral Daily   Continuous Infusions: . heparin 1,300 Units/hr (03/09/21 1720)     LOS: 3 days   Lonia Blood, MD [facility] Office  [phone] Pager -  Text Page per Loretha Stapler  If 7PM-7AM, please contact night-coverage per Amion 03/10/2021, 7:55 AM

## 2021-03-10 NOTE — Evaluation (Signed)
Occupational Therapy Evaluation Patient Details Name: Jasmine [name] MRN: [id number] DOB: November 15, 1976 Today's Date: 03/10/2021    History of Present Illness Pt is a 45 y.o F who presents 3/24 with HA and blurred vision. 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 She was placed on a heparin drip. Significant PMH: anxiety, depression, PTSD, prior ectopic pregnancies.   Clinical Impression   Pt PTA:  Pt living alone and family in town to assist as needed. Pt currently, pt ad lib in room and performing own ADL tasks. Pt standing at sink x5 mins for OOB ADL and ambulatory in room and hallway with modified independence. Only focal deficit noted was her L eye close-up vision was slightly blurry. With R eye shut, pt unable to read small printed lines, but with both eyes open, pt able to read near and far well. Pt does not require continued OT skilled services. OT signing off, thank you. Recommendation to see eye specialist (neuro) for L visual deficits.    Follow Up Recommendations  No OT follow up    Equipment Recommendations  None recommended by OT    Recommendations for Other Services       Precautions / Restrictions Precautions Precautions: None Restrictions Weight Bearing Restrictions: No      Mobility Bed Mobility Overal bed mobility: Modified Independent                  Transfers Overall transfer level: Independent Equipment used: None                  Balance Overall balance assessment: No apparent balance deficits (not formally assessed)                                         ADL either performed or assessed with clinical judgement   ADL Overall ADL's : Modified independent                                       General ADL Comments: Pt ad lib in room and performing own ADL tasks. Pt standing at sink x5 mins for OOB ADL and ambulatory in room and hallway. Only focal deficit noted was her L eye  close-up vision was slightly blurry. With R eye shut, pt unable tto read small printed lines, but with both eyes open, pt able to read near and far well.     Vision Baseline Vision/History: Wears glasses Wears Glasses: At all times Patient Visual Report: Blurring of vision;Other (comment) (L near sighted vision is affected- blurriness noted) Vision Assessment?: Yes;Vision impaired- to be further tested in functional context Eye Alignment: Within Functional Limits Ocular Range of Motion: Within Functional Limits Alignment/Gaze Preference: Within Defined Limits Tracking/Visual Pursuits: Able to track stimulus in all quads without difficulty Additional Comments: Pt reports L visual field for near sightedness is blurry if right in front her, otherwise, pt can see with both eyes open near and far. pt reports this is different from yesterday as her L eye distance visual field was blurry. R eye, WNLs.     Perception     Praxis      Pertinent Vitals/Pain Pain Assessment: No/denies pain     Hand Dominance Right   Extremity/Trunk Assessment Upper Extremity Assessment Upper Extremity Assessment: Overall WFL for tasks  assessed;RUE deficits/detail;LUE deficits/detail RUE Deficits / Details: strength 5/5; AROM WNLs LUE Deficits / Details: strength 5/5; AROM WNLs   Lower Extremity Assessment Lower Extremity Assessment: Overall WFL for tasks assessed RLE Deficits / Details: Strength 5/5 LLE Deficits / Details: Strength 5/5   Cervical / Trunk Assessment Cervical / Trunk Assessment: Normal   Communication Communication Communication: No difficulties   Cognition Arousal/Alertness: Awake/alert Behavior During Therapy: WFL for tasks assessed/performed Overall Cognitive Status: Within Functional Limits for tasks assessed                                     General Comments  VSS on RA.    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, Furniture conservator/restorer        OT Problem List: Impaired vision/perception      OT Treatment/Interventions:      OT Goals(Current goals can be found in the care plan section) Acute Rehab OT Goals Patient Stated Goal: go home OT Goal Formulation: All assessment and education complete, DC therapy  OT Frequency:     Barriers to D/[name]:            Co-evaluation              AM-PAC OT "6 Clicks" Daily Activity     Outcome Measure Help from another person eating meals?: None Help from another person taking care of personal grooming?: None Help from another person toileting, which includes using toliet, bedpan, or urinal?: None Help from another person bathing (including washing, rinsing, drying)?: None Help from another person to put on and taking off regular upper body clothing?: None Help from another person to put on and taking off regular lower body clothing?: None 6 Click Score: 24   End of Session Nurse Communication: Mobility status;Other (comment) (L visual field)  Activity Tolerance: Patient tolerated treatment well Patient left: in bed;with call bell/phone within reach  OT Visit Diagnosis: Unsteadiness on feet (R26.81)                Time: 0947-0962 OT Time Calculation (min): 27 min Charges:  OT General Charges $OT Visit: 1 Visit OT Evaluation $OT Eval Moderate Complexity: 1 Mod OT Treatments $Self Care/Home Management : 8-22 mins  Jasmine [name], OTR/L Acute Rehabilitation Services Pager: [phone] Office:  [phone]   Jasmine [name] 03/10/2021, 2:16 PM

## 2021-03-11 LAB — RETICULOCYTES
Immature Retic Fract: 22 % — ABNORMAL HIGH (ref 2.3–15.9)
RBC.: 3.88 MIL/uL (ref 3.87–5.11)
Retic Count, Absolute: 65.6 10*3/uL (ref 19.0–186.0)
Retic Ct Pct: 1.7 % (ref 0.4–3.1)

## 2021-03-11 LAB — CBC
HCT: 35.9 % — ABNORMAL LOW (ref 36.0–46.0)
Hemoglobin: 11.8 g/dL — ABNORMAL LOW (ref 12.0–15.0)
MCH: 29.7 pg (ref 26.0–34.0)
MCHC: 32.9 g/dL (ref 30.0–36.0)
MCV: 90.4 fL (ref 80.0–100.0)
Platelets: 355 10*3/uL (ref 150–400)
RBC: 3.97 MIL/uL (ref 3.87–5.11)
RDW: 13.4 % (ref 11.5–15.5)
WBC: 7.6 10*3/uL (ref 4.0–10.5)
nRBC: 0 % (ref 0.0–0.2)

## 2021-03-11 LAB — IRON AND TIBC
Iron: 79 ug/dL (ref 28–170)
Saturation Ratios: 27 % (ref 10.4–31.8)
TIBC: 293 ug/dL (ref 250–450)
UIBC: 214 ug/dL

## 2021-03-11 LAB — HEPARIN LEVEL (UNFRACTIONATED): Heparin Unfractionated: 0.65 IU/mL (ref 0.30–0.70)

## 2021-03-11 LAB — FOLATE: Folate: 18.7 ng/mL (ref >5.9)

## 2021-03-11 LAB — FERRITIN: Ferritin: 134 ng/mL (ref 11–307)

## 2021-03-11 LAB — VITAMIN B12: Vitamin B-12: 730 pg/mL (ref 180–914)

## 2021-03-11 LAB — HOMOCYSTEINE: Homocysteine: 9.4 umol/L (ref 0.0–14.5)

## 2021-03-11 MED ORDER — APIXABAN (ELIQUIS) VTE STARTER PACK (10MG AND 5MG): ORAL_TABLET | ORAL | 0 refills | Status: DC

## 2021-03-11 MED ORDER — ACETAMINOPHEN 325 MG PO TABS: 650.0000 mg | ORAL_TABLET | Freq: Four times a day (QID) | ORAL | Status: AC | PRN

## 2021-03-11 MED ORDER — ATORVASTATIN CALCIUM 40 MG PO TABS: 40.0000 mg | ORAL_TABLET | Freq: Every day | ORAL | 1 refills | Status: DC

## 2021-03-11 NOTE — Progress Notes (Signed)
PER BENEFITS CHECK  Mardene Sayer sent to Candida Peeling, RN # 2.  S/W MARSHA @ [facility] RX # [phone]     1. ELIQUIS 5 MG BID  COVER- YES  CO-PAY- $38.00  TIER- PREFERRED  PRIOR APPROVAL- NO    2, ELIQUIS 2.5 MG BID  COVER- YES  CO-PAY- $38..00  TIER- PREFERRED  PRIOR APPROVAL- NO    DEDUCTIBLE :UNMET   PREFERRED PHARMACY : YES  [facility]

## 2021-03-11 NOTE — Progress Notes (Signed)
STROKE TEAM PROGRESS NOTE  HPI:  Jasmine [name] is a 45 y.o. female past medical history of anxiety, depression, PTSD, prior ectopic pregnancies, migraine in the past with last migrainous headache 3 to 4 years ago, presented to the emergency room for evaluation of headaches and blurred vision that started sometime Sunday evening and did not get better and continued to get worse. She describes the headache as a sense of pressure on her forehead as well as left temple and left back of the head along with some pain in her neck as well.  She was evaluated by the ED provider.  A CT venogram was obtained that showed dural venous sinus thrombosis. She was placed on heparin drip. No neurologic deficits were apparent.   INTERVAL HISTORY No acute events overnight.  VSS, Afebrile.  Remains neurologically intact.   Per our discussion today: No further headache or  blurred vision x 2 days.  No BCPs,  No illicit drugs, no smoking, no high protein intake.  Has had Covid x 2 and booster last fall.   We discussed her diagnosis and plan of care including hypercoag labs and starting Eliquis. Her questions were answered.  Instructed to stay hydrated and follow up in Dr. Marlis Edelson clinic in 6 weeks.   Vitals:   03/10/21 1522 03/10/21 1933 03/11/21 0450 03/11/21 0746  BP: 118/80 129/86 121/78 111/78  Pulse: 75 92 81 91  Resp: 20 18 17 14   Temp: 97.8 F (36.6 C) 98 F (36.7 C) 97.8 F (36.6 C) 98.5 F (36.9 C)  TempSrc:  Oral Oral Oral  SpO2: 100% 97% 96% 97%  Weight:      Height:       CBC:  Recent Labs  Lab 03/07/21 1819 03/08/21 0335 03/10/21 0028 03/11/21 0626  WBC 5.8   < > 6.7 7.6  NEUTROABS 3.6  --   --   --   HGB 11.9*   < > 11.6* 11.8*  HCT 36.4   < > 33.5* 35.9*  MCV 91.2   < > 89.1 90.4  PLT 264   < > 327 355   < > = values in this interval not displayed.   Basic Metabolic Panel:  Recent Labs  Lab 03/07/21 1819 03/08/21 0335  NA 136 136  K 3.9 3.6  CL 103 107  CO2 23 21*   GLUCOSE 120* 102*  BUN 12 8  CREATININE 0.91 0.76  CALCIUM 8.5* 8.0*   Lipid Panel:  Recent Labs  Lab 03/08/21 1056  CHOL 168  TRIG 61  HDL 45  CHOLHDL 3.7  VLDL 12  LDLCALC 03/10/21*   HgbA1c:  Recent Labs  Lab 03/08/21 1056  HGBA1C 6.0*    IMAGING past 24 hours No results found. PHYSICAL EXAM  Temp:  [97.8 F (36.6 C)-98.6 F (37 C)] 98.5 F (36.9 C) (03/28 0746) Pulse Rate:  [75-99] 91 (03/28 0746) Resp:  [13-29] 14 (03/28 0746) BP: (111-133)/(78-86) 111/78 (03/28 0746) SpO2:  [96 %-100 %] 97 % (03/28 0746)  General - Well nourished, well developed middle aged female, in no apparent distress.   Cardiovascular - Regular rhythm and rate.   Mental Status -  Level of arousal and orientation to time, place,situation and person were intact.  Language including expression, naming, repetition, comprehension was assessed and found intact. Attention span and concentration were normal. Recent and remote memory were intact.   Cranial Nerves II - XII - II - Visual field intact OU. III, IV, VI -  Extraocular movements intact. V - Facial sensation intact bilaterally. VII - Facial movement intact bilaterally. VIII - Hearing & vestibular intact bilaterally. X - Palate elevates symmetrically. XI - Chin turning & shoulder shrug intact bilaterally. XII - Tongue protrusion intact.  Motor Strength - The patient's strength was normal in all extremities and pronator drift was absent.  Bulk was normal and fasciculations were absent.   Motor Tone - Muscle tone was assessed at the neck and appendages and was normal. .  Sensory - Light touch was assessed in all extremities x4 and was symmetrical.    Coordination - The patient had normal movements in the hands and feet with no ataxia or dysmetria.  Tremor was absent.  Gait and Station - deferred.  ASSESSMENT/PLAN [name] is a 45 y.o. female past medical history of anxiety, depression, PTSD, prior ectopic pregnancies,  migraine in the past with last migrainous headache 3 to 4 years prior to admission, presented to the emergency room ([facility]) for evaluation of headaches and blurred vision that started sometime Sunday evening and did not get better and continued to get worse. She describes the headache as a sense of pressure on her forehead as well as left temple and left back of the head along with some pain in her neck as well. She was evaluated by the ED provider. A CT venogram was obtained that showed dural venous sinus thrombosis. No focal neurologic deficits upon presentation.  She was placed on heparin drip and transferred to [facility] for further neurologic evaluation and care.   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 Near occlusive thrombus throughout the left transverse and sigmoid sinuses.   MRI Brain 03/08/21  1. Abnormal flow void within the posterosuperior sagittal sinus, extending into the left transverse and sigmoid sinuses, consistent with previously identified acute dural sinus thrombosis. No associated venous infarct, hemorrhage, or other complication. 2. Otherwise normal brain MRI. 3. Normal intracranial MRA. 4. Normal MRA of the neck.   2D Echo EF 55-60%  Hypercoagulabe labs are pending (drawn prior to heparin). Available results as below:  Beta-2 Glyco I IgG 0 - 20 GPI IgG units <9    DRVVT 0.0 - 47.0 sec 57.3High    Protein S Ag, Total 60 - 150 % 96    Protein C, Total 60 - 150 % 95    Protein C Activity 73 - 180 % 152    AntiThromb III Func 75 - 120 % 81     LDL 111  HgbA1c 6.0 VTE prophylaxis - on heparin gtt. Heparin levels monitored daily; 0.41 today.   No anticoagulant/antiplatelet prior to admission, Transition from heparin drip to Eliquis.   Therapy recommendations:  No  follow up.   Dispositio/n:  home  Migraine   Not have migraine for the last 3 years  Typical migraine symptoms including headache, photophobia, phonophobia, nausea vomiting  This time headache resolved photophobia, phonophobia nausea vomiting.  Hyperlipidemia  Home meds: None   LDL 111, not at goal < 70  On Lipitor 40mg  daily.  Continue statin at discharge  Hospital day # 4  I have personally obtained history,examined this patient, reviewed notes, independently viewed imaging studies, participated in medical decision making and plan of care.ROS completed by me personally and pertinent positives fully documented  I  have made any additions or clarifications directly to the above note. Agree with note above.  Patient presented with headache and MRI scan showing partial thrombosis of the posterior superior subtitles sinus and left transverse and sigmoid sinuses.  Hypercoagulable panel lab suggest positive lupus anticoagulant and low protein S levels.  Recommend Eliquis and aggressive hydration.  Patient advised to stay away from birth control pills.  She will need outpatient hematology referral for work-up for thrombophilia and decision on duration of anticoagulation.  Follow-up as an outpatient with stroke clinic in 6 weeks.  Greater than 50% time during this 25-minute visit was spent on counseling and coordination of care and discussion about need for anticoagulation and answering questions Delia Heady, MD Medical Director [facility] Pager: [phone] 03/11/2021 4:25 PM

## 2021-03-11 NOTE — Discharge Instructions (Signed)
Follow up in 6 weeks with Dr. Pearlean Brownie ([facility]). [phone]   Information on my medicine - ELIQUIS (apixaban)  This medication education was reviewed with me or my healthcare representative as part of my discharge preparation.  Why was Eliquis prescribed for you? Eliquis was prescribed to treat blood clots that may have been found and to reduce the risk of them occurring again.  What do You need to know about Eliquis ? The starting dose is 10 mg (two 5 mg tablets) taken TWICE daily for the FIRST SEVEN (7) DAYS, then on (enter date)  03/18/2021  the dose is reduced to ONE 5 mg tablet taken TWICE daily.  Eliquis may be taken with or without food.   Try to take the dose about the same time in the morning and in the evening. If you have difficulty swallowing the tablet whole please discuss with your pharmacist how to take the medication safely.  Take Eliquis exactly as prescribed and DO NOT stop taking Eliquis without talking to the doctor who prescribed the medication.  Stopping may increase your risk of developing a new blood clot.  Refill your prescription before you run out.  After discharge, you should have regular check-up appointments with your healthcare provider that is prescribing your Eliquis.    What do you do if you miss a dose? If a dose of ELIQUIS is not taken at the scheduled time, take it as soon as possible on the same day and twice-daily administration should be resumed. The dose should not be doubled to make up for a missed dose.  Important Safety Information A possible side effect of Eliquis is bleeding. You should call your healthcare provider right away if you experience any of the following: ? Bleeding from an injury or your nose that does not stop. ? Unusual colored urine (red or dark brown) or unusual colored stools (red or black). ? Unusual bruising for unknown reasons. ? A serious fall or if you hit your head (even if there is no  bleeding).  Some medicines may interact with Eliquis and might increase your risk of bleeding or clotting while on Eliquis. To help avoid this, consult your healthcare provider or pharmacist prior to using any new prescription or non-prescription medications, including herbals, vitamins, non-steroidal anti-inflammatory drugs (NSAIDs) and supplements.  This website has more information on Eliquis (apixaban): http://[email or web address].com/eliquis/home

## 2021-03-11 NOTE — Discharge Summary (Addendum)
DISCHARGE SUMMARY  Jasmine [name]  MR#: [id number]  DOB:12/26/1975  Date of Admission: 03/07/2021 Date of Discharge: 03/11/2021  Attending Physician:Brooks Kinnan Silvestre Gunner, MD  Patient's ESP:[id number], Clifton Custard, MD  Consults: Neurology/Stroke Team  Disposition: D/C home    Follow-up Appts:  Follow-up Information    Farris Has, MD Follow up in 10 day(s).   Specialty: Family Medicine Contact information: [address] [place] [address] [phone]        Micki Riley, MD Follow up in 6 week(s).   Specialties: Neurology, Radiology Why: The office should call you to arrange an appointment. If you do not hear from them in 2-3 days, call the office to arrange an appointment.  Contact information: [address] [place] [address] [phone]               Tests Needing Follow-up: -numerous hypercoag labs still pending at time of d/c - f/u on results and determine if Hematology referral indicated  - f/u LFTs in 4-5 weeks in pt newly started on Lipitor  -monitor pre-DM state -encourage ongoing exercise and weight loss   Discharge Diagnoses: Acute dural venous sinus thrombosis HLD Elevated A1c - Pre-DM Anxiety/depression Normocytic anemia  Class 2 Obesity - Body mass index is 35.87 kg/m.  Initial presentation: 45yo with a history of anxiety/depression, PTSD, and migraine headaches who presented to the ER with severe left temporal headache (different than her last remote migraine) and blurry vision which had persisted over a week.  In the ER CT venogram revealed dural venous sinus thrombosis.  Hospital Course: 3/24 admit via [facility] 3/24 CT venogram head - dural venous sinus thrombosis  3/25 TTE - EF 55-60% - grade 2 DD 3/25 MRI/A - noted dural venous sinus thrombosis but o/w normal MRI & MRA brain + neck  Acute dural venous sinus thrombosis Etiology unclear - Neurology directing treatment - treated w/ IV heparin then  transitioned to DOAC at time of discharge - hypercoagulable work-up pending at time of d/c   Suspected hypercoagulable state PTT LA normal range, but dRVVT modestly elevated, as well as dRVVT mix and confirm - B2 glycoprotein normal and anti-cardiolipin pending - low Prot S activity ?due to clot itself/acute event - Prot C level and activity normal - ATIII normal - consider Hematology eval as outpt depending upon results of full test panel   HLD LDL 111 -goal less than 70 -Lipitor added this admission - f/u LFTs in 4-5 weeks  Elevated A1c A1c 6.0 in preDM range - discussed w/ pt and advised modified diet and exercise to bring about weight loss to avoid progression to DM  Anxiety/depression Continue home Lexapro  Normocytic anemia  Likely simply related to menstrual loss - Fe studies do not reveal signif Fe deficiency - folate and B12 normal     Class 2 Obesity - Body mass index is 35.87 kg/m.   Allergies as of 03/11/2021   No Known Allergies     Medication List    STOP taking these medications   ibuprofen 200 MG tablet Commonly known as: ADVIL     TAKE these medications   acetaminophen 325 MG tablet Commonly known as: TYLENOL Take 2 tablets (650 mg total) by mouth every 6 (six) hours as needed for fever or mild pain.   Apixaban Starter Pack (10mg  and 5mg ) Commonly known as: ELIQUIS STARTER PACK Take as directed on package: start with two-5mg  tablets twice daily for 7 days. On day 8, switch  to one-5mg  tablet twice daily.   atorvastatin 40 MG tablet Commonly known as: LIPITOR Take 1 tablet (40 mg total) by mouth daily. Start taking on: March 12, 2021   butalbital-acetaminophen-caffeine 50-325-40 MG tablet Commonly known as: FIORICET Take 1-2 tablets by mouth every 6 (six) hours as needed for headache (pain or inflammation).   escitalopram 5 MG tablet Commonly known as: LEXAPRO Take 5 mg by mouth at bedtime.       Day of Discharge BP 111/78 (BP Location:  Right Arm)   Pulse 91   Temp 98.5 F (36.9 C) (Oral)   Resp 14   Ht 5\' 2"  (1.575 m)   Wt 89 kg   SpO2 97%   BMI 35.87 kg/m   Physical Exam: General: No acute respiratory distress Lungs: Clear to auscultation bilaterally without wheezes or crackles Cardiovascular: Regular rate and rhythm without murmur gallop or rub normal S1 and S2 Abdomen: Nontender, nondistended, soft, bowel sounds positive, no rebound, no ascites, no appreciable mass Extremities: No significant cyanosis, clubbing, or edema bilateral lower extremities  Basic Metabolic Panel: Recent Labs  Lab 03/07/21 1819 03/08/21 0335  NA 136 136  K 3.9 3.6  CL 103 107  CO2 23 21*  GLUCOSE 120* 102*  BUN 12 8  CREATININE 0.91 0.76  CALCIUM 8.5* 8.0*    CBC: Recent Labs  Lab 03/07/21 1819 03/08/21 0335 03/09/21 0128 03/10/21 0028 03/11/21 0626  WBC 5.8 6.6 5.7 6.7 7.6  NEUTROABS 3.6  --   --   --   --   HGB 11.9* 11.0* 11.2* 11.6* 11.8*  HCT 36.4 33.9* 34.0* 33.5* 35.9*  MCV 91.2 91.1 91.4 89.1 90.4  PLT 264 254 310 327 355     Recent Results (from the past 240 hour(s))  Resp Panel by RT-PCR (Flu A&B, Covid) Nasopharyngeal Swab     Status: None   Collection Time: 03/07/21  8:07 PM   Specimen: Nasopharyngeal Swab; Nasopharyngeal(NP) swabs in vial transport medium  Result Value Ref Range Status   SARS Coronavirus 2 by RT PCR NEGATIVE NEGATIVE Final    Comment: (NOTE) SARS-CoV-2 target nucleic acids are NOT DETECTED.  The SARS-CoV-2 RNA is generally detectable in upper respiratory specimens during the acute phase of infection. The lowest concentration of SARS-CoV-2 viral copies this assay can detect is 138 copies/mL. A negative result does not preclude SARS-Cov-2 infection and should not be used as the sole basis for treatment or other patient management decisions. A negative result may occur with  improper specimen collection/handling, submission of specimen other than nasopharyngeal swab, presence of  viral mutation(s) within the areas targeted by this assay, and inadequate number of viral copies(<138 copies/mL). A negative result must be combined with clinical observations, patient history, and epidemiological information. The expected result is Negative.  Fact Sheet for Patients:  03/09/21  Fact Sheet for Healthcare Providers:  BloggerCourse.com  This test is no t yet approved or cleared by the [place] FDA and  has been authorized for detection and/or diagnosis of SARS-CoV-2 by FDA under an Emergency Use Authorization (EUA). This EUA will remain  in effect (meaning this test can be used) for the duration of the COVID-19 declaration under Section 564(b)(1) of the Act, 21 U.S.C.section 360bbb-3(b)(1), unless the authorization is terminated  or revoked sooner.       Influenza A by PCR NEGATIVE NEGATIVE Final   Influenza B by PCR NEGATIVE NEGATIVE Final    Comment: (NOTE) The Xpert Xpress SARS-CoV-2/FLU/RSV plus assay is  intended as an aid in the diagnosis of influenza from Nasopharyngeal swab specimens and should not be used as a sole basis for treatment. Nasal washings and aspirates are unacceptable for Xpert Xpress SARS-CoV-2/FLU/RSV testing.  Fact Sheet for Patients: BloggerCourse.com  Fact Sheet for Healthcare Providers: SeriousBroker.it  This test is not yet approved or cleared by the [place] FDA and has been authorized for detection and/or diagnosis of SARS-CoV-2 by FDA under an Emergency Use Authorization (EUA). This EUA will remain in effect (meaning this test can be used) for the duration of the COVID-19 declaration under Section 564(b)(1) of the Act, 21 U.S.C. section 360bbb-3(b)(1), unless the authorization is terminated or revoked.  Performed at [facility], [address] [place], [place] [address]   MRSA PCR Screening      Status: None   Collection Time: 03/07/21 11:25 PM   Specimen: Nasal Mucosa; Nasopharyngeal  Result Value Ref Range Status   MRSA by PCR NEGATIVE NEGATIVE Final    Comment:        The GeneXpert MRSA Assay (FDA approved for NASAL specimens only), is one component of a comprehensive MRSA colonization surveillance program. It is not intended to diagnose MRSA infection nor to guide or monitor treatment for MRSA infections. Performed at [facility], [address], [place], [place] [address]      Time spent in discharge (includes decision making & examination of pt): 35 minutes  03/11/2021, 11:52 AM   Lonia Blood, MD [facility] Office  [phone]

## 2021-03-12 LAB — CARDIOLIPIN ANTIBODIES, IGG, IGM, IGA
Anticardiolipin IgA: <9 APL U/mL (ref 0–11)
Anticardiolipin IgG: <9 GPL U/mL (ref 0–14)
Anticardiolipin IgM: <9 MPL U/mL (ref 0–12)

## 2021-03-14 LAB — FACTOR 5 LEIDEN

## 2021-03-15 LAB — PROTHROMBIN GENE MUTATION

## 2021-04-09 ENCOUNTER — Ambulatory Visit: Admitting: Adult Health

## 2021-04-09 ENCOUNTER — Encounter: Payer: Self-pay | Admitting: Adult Health

## 2021-04-09 VITALS — BP 138/89 | HR 74 | Ht 62.0 in | Wt 190.0 lb

## 2021-04-09 DIAGNOSIS — G08 Intracranial and intraspinal phlebitis and thrombophlebitis: Secondary | ICD-10-CM

## 2021-04-09 DIAGNOSIS — E785 Hyperlipidemia, unspecified: Secondary | ICD-10-CM

## 2021-04-09 DIAGNOSIS — D6859 Other primary thrombophilia: Secondary | ICD-10-CM

## 2021-04-09 DIAGNOSIS — R7303 Prediabetes: Secondary | ICD-10-CM | POA: Diagnosis not present

## 2021-04-09 MED ORDER — ELIQUIS 5 MG PO TABS: 5.0000 mg | ORAL_TABLET | Freq: Two times a day (BID) | ORAL | 5 refills | Status: DC

## 2021-04-09 NOTE — Addendum Note (Signed)
Addended by: Raliegh Ip on: 04/09/2021 10:50 AM   Modules accepted: Orders

## 2021-04-09 NOTE — Progress Notes (Signed)
[facility] [address] [place]. [place] [address] [phone]       HOSPITAL FOLLOW UP NOTE  [name] Date of Birth:  August 04, 1976 Medical Record Number:  [id number]   Reason for Referral:  hospital stroke follow up    SUBJECTIVE:   CHIEF COMPLAINT:  Chief Complaint  Patient presents with  . Follow-up    Rm 14  alone Pt is well, no symptoms, no complaints     HPI:   Jasmine F Turneris a 45 y.o.femalepast medical history of anxiety, depression, PTSD, prior ectopic pregnancies, and migraine in the past with last migrainous headache 3 to 4 years prior to admission, who presented to outside hospital on 03/07/2021 for evaluation of headaches and blurred vision that started sometime 3/20 and did not get better and continued to get worse.  Personally reviewed hospitalization pertinent progress notes, lab work and imaging summary provided.  CT venogram showed dural venous sinus thrombosis and was transferred to [facility] for further evaluation.  MRI consistent with previously identified dural sinus thrombosis and negative for acute infarct.  Hypercoagulable labs positive for lupus anticoagulant and low protein S levels.  Initiated Eliquis and aggressive hydration.  Hematology referral placed for further evaluation and decision on duration of anticoagulant.  LDL 111 and initiate atorvastatin 40 mg daily.  History of migraine with presenting headache not consistent with known migraines.  Evaluated by therapies without therapy needs and discharged home in stable condition.  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 Near occlusive thrombus throughout the left transverse and sigmoid sinuses.   MRI Brain 03/08/21  1. Abnormal flow void within the posterosuperior sagittal sinus, extending  into the left transverse and sigmoid sinuses, consistent with previously identified acute dural sinus thrombosis. No associated venous infarct, hemorrhage, or other complication. 2. Otherwise normal brain MRI. 3. Normal intracranial MRA. 4. Normal MRA of the neck.   2D Echo EF 55-60%  Hypercoagulabe labs all negative except as below  DRVVT PTT lupus anticoagulant 0.0 - 47.0 sec 0.0-51.9 sec 57.3High  29.7   Protein S activity  63-140%  37 point  Protein S Ag, Total 60 - 150 % 96     LDL 111  HgbA1c 6.0  VTE prophylaxis - on heparin gtt. Heparin levels monitored daily; 0.41 today.   No anticoagulant/antiplatelet prior to admission, Transition from heparin drip to Eliquis.   Therapy recommendations:  No follow up.   Dispositio/n:  home   Today, 04/09/2021, [name] is being seen for hospital follow-up unaccompanied  Doing well since discharge without residual deficits or new/recurrent stroke/TIA symptoms.  Reports compliance on Eliquis 5 mg twice daily and atorvastatin 40 mg daily without associated side effects.  Blood pressure today 138/89.  She has returned back to all prior activities without difficulty  Multiple questions regarding possible clotting disorder and cause of recent event.  She denies any prior known clotting disorder or hx of clots.  Denies use of birth control pills or estrogen.  She does admit to being dehydrated at that time but has since increased fluid intake.  She does report her mother has a history of autoimmune connective tissue disorder (unsure of specific type) and hypothyroidism.  Sister has history of sarcoidosis.  No further concerns at this time      ROS:  14 system review of systems performed and negative with exception of no complaints  PMH:  Past Medical History:  Diagnosis Date  . Anxiety   . Depression   . Ectopic pregnancy   . Migraine   . Osteoarthritis   . Plantar fasciitis   . PTSD (post-traumatic stress  disorder)     PSH:  Past Surgical History:  Procedure Laterality Date  . ECTOPIC PREGNANCY SURGERY  10/2000  . ECTOPIC PREGNANCY SURGERY  01/2004    Social History:  Social History   Socioeconomic History  . Marital status: Divorced    Spouse name: Not on file  . Number of children: Not on file  . Years of education: Not on file  . Highest education level: Not on file  Occupational History  . Not on file  Tobacco Use  . Smoking status: Never Smoker  . Smokeless tobacco: Never Used  Vaping Use  . Vaping Use: Never used  Substance and Sexual Activity  . Alcohol use: No  . Drug use: No  . Sexual activity: Not Currently    Birth control/protection: None  Other Topics Concern  . Not on file  Social History Narrative  . Not on file   Social Determinants of Health   Financial Resource Strain: Not on file  Food Insecurity: Not on file  Transportation Needs: Not on file  Physical Activity: Not on file  Stress: Not on file  Social Connections: Not on file  Intimate Partner Violence: Not on file    Family History:  Family History  Problem Relation Age of Onset  . Hypertension Mother   . Seizures Mother     Medications:   Current Outpatient Medications on File Prior to Visit  Medication Sig Dispense Refill  . acetaminophen (TYLENOL) 325 MG tablet Take 2 tablets (650 mg total) by mouth every 6 (six) hours as needed for fever or mild pain.    Marland Kitchen atorvastatin (LIPITOR) 40 MG tablet Take 1 tablet (40 mg total) by mouth daily. 30 tablet 1  . escitalopram (LEXAPRO) 10 MG tablet Take 10 mg by mouth at bedtime.     No current facility-administered medications on file prior to visit.    Allergies:  No Known Allergies    OBJECTIVE:  Physical Exam  Vitals:   04/09/21 0843  BP: 138/89  Pulse: 74  Weight: 190 lb (86.2 kg)  Height: 5\' 2"  (1.575 m)   Body mass index is 34.75 kg/m. No exam data present  Post stroke PHQ 2/9 Depression screen PHQ 2/9 04/09/2021   Decreased Interest 0  Down, Depressed, Hopeless 0  PHQ - 2 Score 0     General: well developed, well nourished, very pleasant middle-aged African-American female, seated, in no evident distress Head: head normocephalic and atraumatic.   Neck: supple with no carotid or supraclavicular bruits Cardiovascular: regular rate and rhythm, no murmurs Musculoskeletal: no deformity Skin:  no rash/petichiae Vascular:  Normal pulses all extremities   Neurologic Exam Mental Status: Awake and fully alert.   Fluent speech and language.  Oriented to place and time. Recent and remote memory intact. Attention span, concentration and fund of knowledge appropriate. Mood and affect appropriate.  Cranial Nerves: Fundoscopic exam reveals sharp disc margins. Pupils equal, briskly reactive to light. Extraocular movements full without nystagmus. Visual fields full to confrontation. Hearing intact. Facial sensation intact. Face, tongue, palate moves normally and symmetrically.  Motor: Normal bulk and tone. Normal strength in all tested extremity muscles Sensory.: intact to touch ,  pinprick , position and vibratory sensation.  Coordination: Rapid alternating movements normal in all extremities. Finger-to-nose and heel-to-shin performed accurately bilaterally. Gait and Station: Arises from chair without difficulty. Stance is normal. Gait demonstrates normal stride length and balance without use of assistive device. Tandem walk and heel toe without difficulty.  Reflexes: 1+ and symmetric. Toes downgoing.     NIHSS  0 Modified Rankin  0      ASSESSMENT: Jasmine [name] is a 45 y.o. year old female presented with 4-day onset of headache and blurred vision on 03/07/2021 with evidence of dural venous sinus thrombosis secondary to unknown etiology although hypercoagulable work-up positive lupus anticoagulant and low protein S levels. Vascular risk factors include history of migraines, pre-DM, normocytic anemia and HLD.       PLAN:  1. Dural venous sinus thrombosis: No residual symptoms.  Plan to repeat CTV at the end of June.  Continue Eliquis (apixaban) daily  and atorvastatin for secondary stroke prevention.  Discussed secondary stroke prevention measures and importance of close PCP follow up for aggressive stroke risk factor management  2. Suspected hypercoagulable state: LA normal range but dRVVT modestly elevated as well as mixing confirm.  Protein S activity 37 (low).  All others within normal limits. Plan to repeat hypercoagulable panel around end of June (12 weeks from initial testing).  Discussed importance of adequate hydration and to continue Eliquis -refill provided.  If levels remain abnormal, will refer to hematology 3. HLD: LDL goal <70. Recent LDL 111 - started atorvastatin 40 mg daily during recent admission. Repeat lipid panel today 4. Pre-DM: A1c goal<7.  Recent A1c 6.0. repeat A1c today. Encouraged continued diet modification and routine exercise to avoid progression to DM     Follow up in 3 months or call earlier if needed   CC:  [facility] provider: Dr. Pearlean Brownie PCP: Loyola Mast, MD    I spent 45 minutes of face-to-face and non-face-to-face time with patient.  This included previsit chart review including recent hospitalization pertinent progress notes, lab work and imaging, lab review, study review, order entry, electronic health record documentation, patient education regarding recent dural venous sinus thrombosis and potential etiologies, further hypercoagulable work-up in the future, importance of managing stroke risk factors and answered all other questions to patient satisfaction   Ihor Austin, AGNP-BC  [facility] [address] [place], [place] [address]  Phone [phone] Fax [phone] Note: This document was prepared with digital dictation and possible smart phrase technology. Any transcriptional errors that result from this process are  unintentional.

## 2021-04-09 NOTE — Patient Instructions (Addendum)
Continue Eliquis (apixaban) daily  and atorvastatin  for secondary stroke prevention  We will check cholesterol and A1c today  We will plan on rechecking clotting disorder labs towards the end of June as well as repeat imaging to assess for clot resolution  Continue to follow up with PCP regarding cholesterol management  Maintain strict control of cholesterol with LDL cholesterol (bad cholesterol) goal below 70 mg/dL.      Followup in the future with me in 3 months or call earlier if needed      Thank you for coming to see [place] at [facility]. I hope we have been able to provide you high quality care today.  You may receive a patient satisfaction survey over the next few weeks. We would appreciate your feedback and comments so that we may continue to improve ourselves and the health of our patients.

## 2021-04-10 ENCOUNTER — Encounter: Payer: Self-pay | Admitting: Family Medicine

## 2021-04-10 ENCOUNTER — Other Ambulatory Visit: Payer: Self-pay

## 2021-04-10 ENCOUNTER — Ambulatory Visit (INDEPENDENT_AMBULATORY_CARE_PROVIDER_SITE_OTHER): Admitting: Family Medicine

## 2021-04-10 VITALS — BP 118/68 | HR 88 | Temp 97.4°F | Ht 62.0 in | Wt 188.4 lb

## 2021-04-10 DIAGNOSIS — F331 Major depressive disorder, recurrent, moderate: Secondary | ICD-10-CM | POA: Diagnosis not present

## 2021-04-10 DIAGNOSIS — G08 Intracranial and intraspinal phlebitis and thrombophlebitis: Secondary | ICD-10-CM | POA: Diagnosis not present

## 2021-04-10 DIAGNOSIS — Z1231 Encounter for screening mammogram for malignant neoplasm of breast: Secondary | ICD-10-CM

## 2021-04-10 DIAGNOSIS — E669 Obesity, unspecified: Secondary | ICD-10-CM | POA: Insufficient documentation

## 2021-04-10 DIAGNOSIS — Z23 Encounter for immunization: Secondary | ICD-10-CM

## 2021-04-10 DIAGNOSIS — E785 Hyperlipidemia, unspecified: Secondary | ICD-10-CM | POA: Diagnosis not present

## 2021-04-10 DIAGNOSIS — D649 Anemia, unspecified: Secondary | ICD-10-CM | POA: Insufficient documentation

## 2021-04-10 DIAGNOSIS — R7303 Prediabetes: Secondary | ICD-10-CM | POA: Insufficient documentation

## 2021-04-10 LAB — HEMOGLOBIN A1C
Est. average glucose Bld gHb Est-mCnc: 128 mg/dL
Hgb A1c MFr Bld: 6.1 % — ABNORMAL HIGH (ref 4.8–5.6)

## 2021-04-10 LAB — LIPID PANEL
Chol/HDL Ratio: 2.6 ratio (ref 0.0–4.4)
Cholesterol, Total: 118 mg/dL (ref 100–199)
HDL: 45 mg/dL (ref >39)
LDL Chol Calc (NIH): 61 mg/dL (ref 0–99)
Triglycerides: 53 mg/dL (ref 0–149)
VLDL Cholesterol Cal: 12 mg/dL (ref 5–40)

## 2021-04-10 NOTE — Progress Notes (Signed)
[facility] [address] [place] [address] Dept: [phone] Dept Fax: [phone]  New Patient Office Visit  Subjective:    Patient ID: Jasmine [name], female    DOB: 31-Oct-1976, 45 y.o..   MRN: [id number]  Chief Complaint  Patient presents with  . Establish Care    NP- CPE/labs.  No concerns.  Wants Tdap (last 2009) and wants to discuss stopping Lipitor.    History of Present Illness:  Patient is in today to establish care. Jasmine [name] is from [place]. She is single and has no children (though she has a mutt named Achilles). She served 20 years in the [employer], retiring as a Production manager (E-7). She lived for about 7 years in [place] She retired on 02/12/2014. She [facility] by the [facility] at 85% disabled for PTSD, Anxiety, MDD, bilateral ankle tendon injuries, knee arthritis, and a TBI. She does not currently receive care through the [place]. 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  Jasmine [name] recently was hospitalized at [facility] with dural venous sinus thrombosis involving the superior sagittal sinus, confluence of sinuses and the left transverse and sigmoid dural sinuses. She was treated with anticoagulaiton and discharged on Eliquis. She had an outpatient visit with Jasmine [name] (Neurology NP) yesterday. Jasmine [name] was started on atorvastatin in the hospital. She questions the need for this in light of marginal lipid elevations. She denies current headache or visual changes.  Jasmine [name] has a past history of PTSD, anxiety, and major depressive disorder. She is currently managed on a low dose of Lexapro and finds these symptoms are doing well.  Past Medical History: Patient Active Problem List   Diagnosis Date Noted  . Hyperlipidemia 04/10/2021  . Prediabetes 04/10/2021  . Normocytic anemia 04/10/2021  . Obesity (BMI 35.0-39.9 without comorbidity) 04/10/2021  . Anxiety 03/08/2021  . Dural  venous sinus thrombosis 03/07/2021  . MDD (major depressive disorder), recurrent episode, moderate (HCC) 03/05/2016   Past Surgical History:  Procedure Laterality Date  . ECTOPIC PREGNANCY SURGERY  10/2000  . ECTOPIC PREGNANCY SURGERY  01/2004   Family History  Problem Relation Age of Onset  . Hypertension Mother   . Seizures Mother    Outpatient Medications Prior to Visit  Medication Sig Dispense Refill  . acetaminophen (TYLENOL) 325 MG tablet Take 2 tablets (650 mg total) by mouth every 6 (six) hours as needed for fever or mild pain.    Marland Kitchen apixaban (ELIQUIS) 5 MG TABS tablet Take 1 tablet (5 mg total) by mouth 2 (two) times daily. 60 tablet 5  . escitalopram (LEXAPRO) 10 MG tablet Take 10 mg by mouth at bedtime.    Marland Kitchen atorvastatin (LIPITOR) 40 MG tablet Take 1 tablet (40 mg total) by mouth daily. 30 tablet 1   No facility-administered medications prior to visit.    No Known Allergies    Objective:   Today's Vitals   04/10/21 0852  BP: 118/68  Pulse: 88  Temp: (!) 97.4 F (36.3 C)  TempSrc: Temporal  SpO2: 98%  Weight: 188 lb 6.4 oz (85.5 kg)  Height: 5\' 2"  (1.575 m)   Body mass index is 34.46 kg/m.   General: Well developed, well nourished. No acute distress. HEENT: Normocephalic, non-traumatic. PERRL. Conjunctiva clear. Fundiscopic exam shows normal disc and    vasculature. External ears normal. EAC and TMs normal bilaterally. Nose clear without congestion or rhinorrhea. Mucous   membranes moist. Oropharynx clear. Good  dentition. Neck: Supple. No lymphadenopathy. No thyromegaly. Lungs: Clear to auscultation bilaterally. No wheezing, rales or rhonchi. CV: RRR without murmurs or rubs. Pulses 2+ bilaterally. Abdomen: Soft, non-tender. Bowel sounds positive, normal pitch and frequency. No hepatosplenomegaly. No rebound or guarding. Extremities: Full ROM. No joint swelling or tenderness. No edema noted. Skin: Warm and dry. No rashes. Neuro: No gross neurological  deficits.  Psych: Alert and oriented. Normal mood and affect.  Health Maintenance Due  Topic Date Due  . Hepatitis C Screening  Never done  . TETANUS/TDAP  Never done  . PAP SMEAR-Modifier  Never done  . COLONOSCOPY (Pts 45-16yrs Insurance coverage will need to be confirmed)  Never done   Imaging: CT Venogram (03/07/2021) IMPRESSION: 1. Subocclusive thrombus within the inferior aspect of the superior sagittal sinus and confluence of sinuses. 2. Near occlusive thrombus throughout the left transverse and sigmoid dural venous sinuses. Some enhancement is seen within the visualized upper left internal jugular vein. 3. No evidence of acute intracranial hemorrhage or acute infarct.  MRI of Brain and MR angiogram of brain and neck (03/08/2021) IMPRESSION: 1. Abnormal flow void within the posterosuperior sagittal sinus, extending into the left transverse and sigmoid sinuses, consistent with previously identified acute dural sinus thrombosis. No associated venous infarct, hemorrhage, or other complication. 2. Otherwise normal brain MRI. 3. Normal intracranial MRA. 4. Normal MRA of the neck.  Assessment & Plan:   1. Dural venous sinus thrombosis I reviewed hospital discharge summary, CT venogram, MRI scan results, and lab results form her hospitalization and since. Jasmine [name] was found to have a normal total Protein S but low activity. She was lupus anticoagulant negative, but with a positive DRVVT. She has been referred to a hematologist for further evaluation of this. I suspect this will not be found to represent any particular hypercoagulable issue. If not, she will need to remain on anticoagulation with Eliquis for 6-12 months for this unprovoked cerebral venous thrombosis.  2. MDD (major depressive disorder), recurrent episode, moderate (HCC) Stable on Lexapro.  3. Hyperlipidemia, unspecified hyperlipidemia type Having reviewed the lipid profile from her hospitalization, I agree with MS.  [name] that statin therapy was not necessarily indicated. We will give her a trial off of statins. I will repeat a fasting lipid profile at her next visit in 3 months to re-evaluate.  4. Encounter for screening mammogram for malignant neoplasm of breast  - MM DIGITAL SCREENING BILATERAL; Future  5. Need for Td vaccine  - Td : Tetanus/diphtheria >7yo Preservative  free  Loyola Mast, MD

## 2021-04-11 NOTE — Progress Notes (Signed)
I agree with the above plan 

## 2021-04-30 ENCOUNTER — Emergency Department (HOSPITAL_BASED_OUTPATIENT_CLINIC_OR_DEPARTMENT_OTHER)
Admission: EM | Admit: 2021-04-30 | Discharge: 2021-04-30 | Disposition: A | Discharge status: Home / Self Care | Attending: Emergency Medicine | Admitting: Emergency Medicine

## 2021-04-30 ENCOUNTER — Encounter (HOSPITAL_BASED_OUTPATIENT_CLINIC_OR_DEPARTMENT_OTHER): Payer: Self-pay | Admitting: *Deleted

## 2021-04-30 ENCOUNTER — Other Ambulatory Visit: Payer: Self-pay

## 2021-04-30 ENCOUNTER — Telehealth: Payer: Self-pay | Admitting: Family Medicine

## 2021-04-30 ENCOUNTER — Emergency Department (HOSPITAL_BASED_OUTPATIENT_CLINIC_OR_DEPARTMENT_OTHER)

## 2021-04-30 DIAGNOSIS — H538 Other visual disturbances: Secondary | ICD-10-CM | POA: Diagnosis not present

## 2021-04-30 DIAGNOSIS — R519 Headache, unspecified: Secondary | ICD-10-CM | POA: Insufficient documentation

## 2021-04-30 DIAGNOSIS — Z7901 Long term (current) use of anticoagulants: Secondary | ICD-10-CM | POA: Diagnosis not present

## 2021-04-30 HISTORY — DX: Intracranial and intraspinal phlebitis and thrombophlebitis: G08

## 2021-04-30 LAB — CBC WITH DIFFERENTIAL/PLATELET
Abs Immature Granulocytes: 0.01 10*3/uL (ref 0.00–0.07)
Basophils Absolute: 0 10*3/uL (ref 0.0–0.1)
Basophils Relative: 0 %
Eosinophils Absolute: 0.1 10*3/uL (ref 0.0–0.5)
Eosinophils Relative: 3 %
HCT: 38.4 % (ref 36.0–46.0)
Hemoglobin: 12.7 g/dL (ref 12.0–15.0)
Immature Granulocytes: 0 %
Lymphocytes Relative: 42 %
Lymphs Abs: 2 10*3/uL (ref 0.7–4.0)
MCH: 30.5 pg (ref 26.0–34.0)
MCHC: 33.1 g/dL (ref 30.0–36.0)
MCV: 92.3 fL (ref 80.0–100.0)
Monocytes Absolute: 0.3 10*3/uL (ref 0.1–1.0)
Monocytes Relative: 7 %
Neutro Abs: 2.3 10*3/uL (ref 1.7–7.7)
Neutrophils Relative %: 48 %
Platelets: 334 10*3/uL (ref 150–400)
RBC: 4.16 MIL/uL (ref 3.87–5.11)
RDW: 14.2 % (ref 11.5–15.5)
WBC: 4.8 10*3/uL (ref 4.0–10.5)
nRBC: 0 % (ref 0.0–0.2)

## 2021-04-30 LAB — BASIC METABOLIC PANEL
Anion gap: 8 (ref 5–15)
BUN: 12 mg/dL (ref 6–20)
CO2: 26 mmol/L (ref 22–32)
Calcium: 9.3 mg/dL (ref 8.9–10.3)
Chloride: 105 mmol/L (ref 98–111)
Creatinine, Ser: 0.83 mg/dL (ref 0.44–1.00)
GFR, Estimated: >60 mL/min (ref >60)
Glucose, Bld: 88 mg/dL (ref 70–99)
Potassium: 4.1 mmol/L (ref 3.5–5.1)
Sodium: 139 mmol/L (ref 135–145)

## 2021-04-30 IMAGING — CT CT VENOGRAM HEAD
1 of 10 series · 6 of 47 positions shown · non-contrast
Comparison: MRI head [DATE].  CT venogram [DATE].

CLINICAL DATA: Provided history: Headache, secondary. Dural venous
sinus thrombosis suspected. Additional history provided: Patient
reports headache for 4 days, pressure in forehead, lightheaded,
blurred vision, history of "blood clot in her head."

EXAM:
CT HEAD WITHOUT CONTRAST
TECHNIQUE: Contiguous axial images were obtained from the base of the skull
through the vertex without intravenous contrast.

[Series 6: head venogram · axial · 0.45mm/px · z∈[+1198,+1314]mm · 6 of 82 slices shown]
[im 12/82  brain]
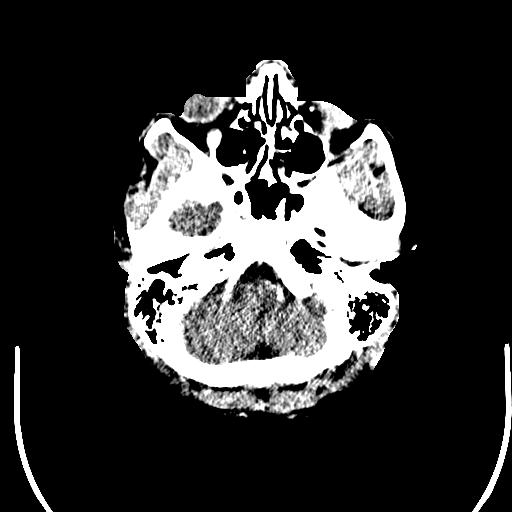
[im 24/82  bone]
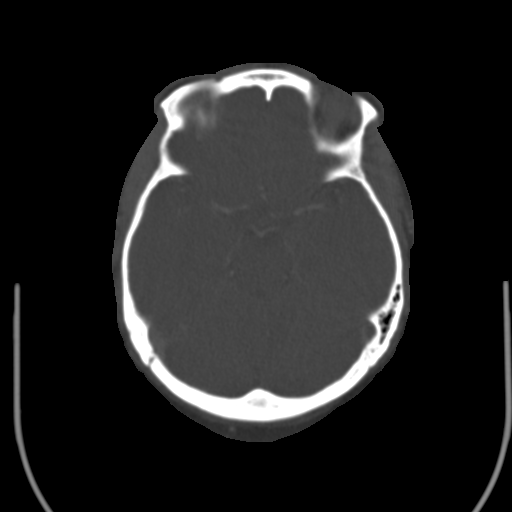
[im 35/82  brain]
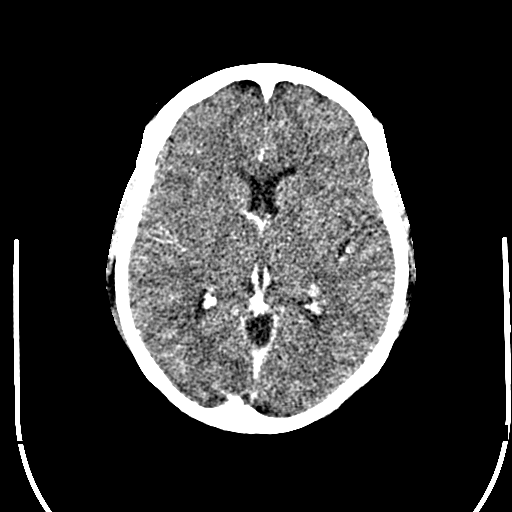
[im 47/82  bone]
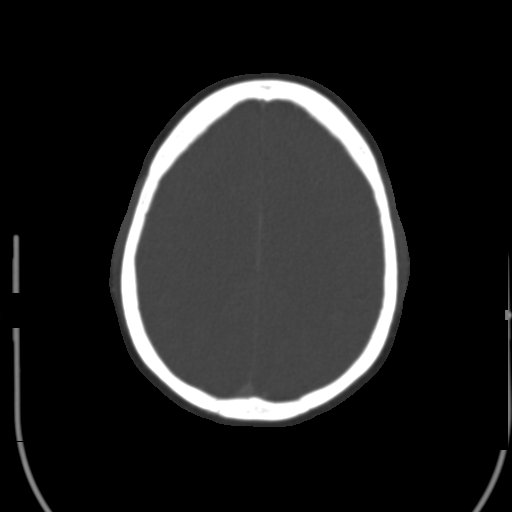
[im 58/82  brain]
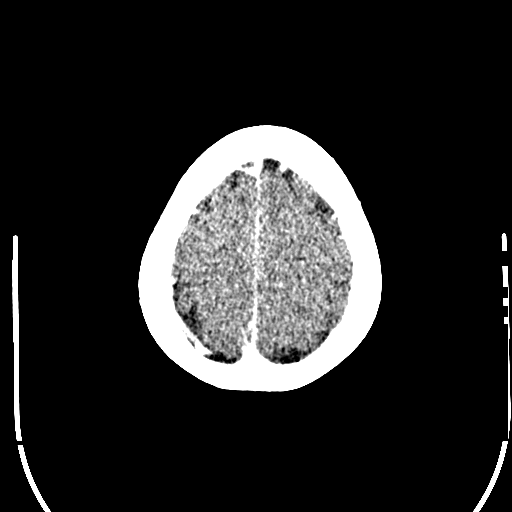
[im 70/82  bone]
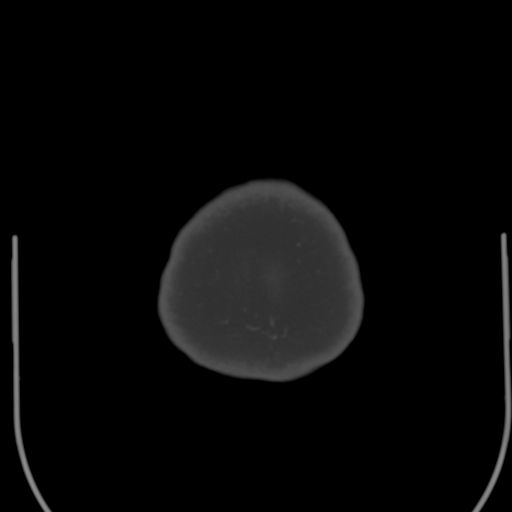

[6 of 47 positions shown; findings below may reference images not displayed]

A noncontrast CT of the brain was performed. Coronal and sagittal
reconstructions were submitted for interpretation. Subsequently, CT
venography of the head was performed following the bolus
administration of 100 mL Omnipaque 350 intravenous contrast. Coronal
and sagittal thin MIP reconstructions were provided. Axial, coronal
and sagittal thick MIP reconstructions were also provided for
interpretation.
FINDINGS: NON-CONTRAST CT HEAD:

Brain:

Cerebral volume is normal.

There is no acute intracranial hemorrhage.

No demarcated cortical infarct.

No extra-axial fluid collection.

No evidence of intracranial mass.

No midline shift.

Vascular: No hyperdense vessel.

Skull: Normal. Negative for fracture or focal lesion.

Sinuses/Orbits: Visualized orbits show no acute finding. No
significant paranasal sinus disease at the imaged levels.

CT venogram head:

Persistent subocclusive thrombus within the inferior aspect of the
superior sagittal sinus/confluence of sinuses. Persistent near
occlusive thrombus within the left transverse and sigmoid dural
venous sinuses. As before, some enhancement is seen within the upper
left internal jugular vein.

The superior sagittal sinus is otherwise patent. Redemonstrated
rounded filling defect within the posterosuperior sagittal sinus,
likely reflecting an arachnoid granulation. The internal cerebral
veins, vein of [place], straight sinus, right transverse sinus, right
sigmoid sinus and visualized right internal jugular vein are patent.
IMPRESSION: Persistent subocclusive thrombus within the inferior aspect of the
superior sagittal sinus/confluence of sinuses. Persistent near
occlusive thrombus throughout the left transverse and sigmoid dural
venous sinuses. These findings are similar as compared to the CT
venogram of [DATE]. As before, some enhancement is seen within
the upper left internal jugular vein.

No evidence of acute infarct or intracranial hemorrhage.

## 2021-04-30 MED ORDER — ACETAMINOPHEN 500 MG PO TABS
1000.0000 mg | ORAL_TABLET | Freq: Once | ORAL | Status: AC
  Administered 2021-04-30: 1000 mg via ORAL
  Filled 2021-04-30: qty 2

## 2021-04-30 MED ORDER — ACETAMINOPHEN 500 MG PO TABS: 1000.0000 mg | ORAL_TABLET | Freq: Once | ORAL | Status: DC

## 2021-04-30 MED ORDER — IOHEXOL 350 MG/ML SOLN
100.0000 mL | Freq: Once | INTRAVENOUS | Status: AC | PRN
  Administered 2021-04-30: 75 mL via INTRAVENOUS

## 2021-04-30 NOTE — ED Triage Notes (Signed)
Headache x 4 days. Pressure in her forehead. Lightheaded and blurred vision at times. Hx of "blood clot in her head."  She takes Eliquis.

## 2021-04-30 NOTE — ED Notes (Signed)
See EDP assessment; pt ambulatory and stable at time of discharge

## 2021-04-30 NOTE — Telephone Encounter (Signed)
Called patient and spoke to her, she is experiencing HA's and wants to know if she should go and get checked out due to her recent diagnosis.  Spoke tp Dr Veto Kemps and he recommends her to go to the ER to be evaluated.  Advised her of his recommendations and she will go to be seen at ER on [address] Dm/cma

## 2021-04-30 NOTE — ED Notes (Signed)
Patient transported to CT 

## 2021-04-30 NOTE — ED Provider Notes (Signed)
[facility] EMERGENCY DEPARTMENT Provider Note   CSN: [id number] Arrival date & time: 04/30/21  1559     History No chief complaint on file.   Jasmine [name] is a 45 y.o. female.  Patient is a 45 year old female presenting today with complaint of headache that started around 10:00 and has gradually worsened.  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  Patient has been on Eliquis since leaving the hospital and has not been having headaches.  She had a mild headache on Friday that did resolve with Tylenol but today the headache is worse.  She describes it as significant pressure in the front of her face and some intermittent blurry vision.  She has no unilateral numbness or tingling.  She reports typically with her migraines she always has nausea and vomiting which she does not have any of that.  She is not light sensitive.  She has had some intermittent tinnitus but is not sure if that is related.  She denies any tinnitus at this moment.  She has not missed any doses of her Eliquis.  There is no explanation to why she got the thrombosis as she has no history of clotting disorder, it does not run in her family she had no recent medication changes, immobilizations or surgeries.  The history is provided by the patient and medical records.       Past Medical History:  Diagnosis Date  . Anxiety   . Cerebral venous thrombosis   . Depression   . Ectopic pregnancy   . Migraine   . Osteoarthritis   . Plantar fasciitis   . PTSD (post-traumatic stress disorder)     Patient Active Problem List   Diagnosis Date Noted  . Hyperlipidemia 04/10/2021  . Prediabetes 04/10/2021  . Normocytic anemia 04/10/2021  . Obesity (BMI 35.0-39.9 without comorbidity) 04/10/2021  . Anxiety 03/08/2021  . Dural venous sinus thrombosis  03/07/2021  . MDD (major depressive disorder), recurrent episode, moderate (HCC) 03/05/2016    Past Surgical History:  Procedure Laterality Date  . ECTOPIC PREGNANCY SURGERY  10/2000  . ECTOPIC PREGNANCY SURGERY  01/2004     OB History   No obstetric history on file.     Family History  Problem Relation Age of Onset  . Hypertension Mother   . Seizures Mother     Social History   Tobacco Use  . Smoking status: Never Smoker  . Smokeless tobacco: Never Used  Vaping Use  . Vaping Use: Never used  Substance Use Topics  . Alcohol use: No  . Drug use: No    Home Medications Prior to Admission medications   Medication Sig Start Date End Date Taking? Authorizing Provider  acetaminophen (TYLENOL) 325 MG tablet Take 2 tablets (650 mg total) by mouth every 6 (six) hours as needed for fever or mild pain. 03/11/21   Lonia Blood, MD  apixaban (ELIQUIS) 5 MG TABS tablet Take 1 tablet (5 mg total) by mouth 2 (two) times daily. 04/09/21   Ihor Austin, NP  escitalopram (LEXAPRO) 10 MG tablet Take 10 mg by mouth at bedtime.    [provider]    Allergies    Patient has no known allergies.  Review of Systems   Review of Systems  All other systems reviewed and are negative.  Physical Exam Updated Vital Signs BP (!) 139/93 (BP Location: Right Arm)   Pulse 83   Temp 98.5 F (36.9 C) (Oral)   Resp 18   Ht 5\' 2"  (1.575 m)   Wt 85.5 kg   LMP 04/19/2021   SpO2 100%   BMI 34.48 kg/m   Physical Exam Vitals and nursing note reviewed.  Constitutional:      General: She is not in acute distress.    Appearance: Normal appearance. She is well-developed.  HENT:     Head: Normocephalic and atraumatic.     Right Ear: Tympanic membrane normal.     Left Ear: Tympanic membrane normal.     Nose: Nose normal.     Mouth/Throat:     Mouth: Mucous membranes are moist.  Eyes:     Extraocular Movements: Extraocular movements intact.     Conjunctiva/sclera: Conjunctivae  normal.     Pupils: Pupils are equal, round, and reactive to light.  Cardiovascular:     Rate and Rhythm: Normal rate and regular rhythm.     Heart sounds: Normal heart sounds. No murmur heard. No friction rub.  Pulmonary:     Effort: Pulmonary effort is normal.     Breath sounds: Normal breath sounds. No wheezing or rales.  Abdominal:     General: Bowel sounds are normal. There is no distension.     Palpations: Abdomen is soft.     Tenderness: There is no abdominal tenderness. There is no guarding or rebound.  Musculoskeletal:        General: No tenderness. Normal range of motion.     Cervical back: Normal range of motion and neck supple. No tenderness.     Right lower leg: No edema.     Left lower leg: No edema.     Comments: No edema  Skin:    General: Skin is warm and dry.     Findings: No rash.  Neurological:     General: No focal deficit present.     Mental Status: She is alert and oriented to person, place, and time. Mental status is at baseline.     Cranial Nerves: Cranial nerves are intact. No cranial nerve deficit.     Sensory: Sensation is intact. No sensory deficit.     Motor: No weakness or pronator drift.     Coordination: Coordination is intact. Coordination normal. Heel to Shin Test normal.     Gait: Gait is intact. Gait normal.  Psychiatric:        Mood and Affect: Mood normal.        Behavior: Behavior normal.     ED Results / Procedures / Treatments   Labs (all labs ordered are listed, but only abnormal results are displayed) Labs Reviewed  CBC WITH DIFFERENTIAL/PLATELET  BASIC METABOLIC PANEL    EKG None  Radiology CT VENOGRAM HEAD  Result Date: 04/30/2021 CLINICAL DATA:  Provided history: Headache, secondary. Dural venous sinus thrombosis suspected. Additional history provided: Patient reports headache for 4 days, pressure in forehead, lightheaded, blurred vision, history of "blood clot in her head." EXAM: CT HEAD WITHOUT CONTRAST TECHNIQUE:  Contiguous axial images were obtained from the base of the skull through the vertex without intravenous contrast. A noncontrast CT of the brain was performed. Coronal and sagittal reconstructions were submitted for interpretation. Subsequently, CT venography of the head was performed following the bolus administration of 100 mL Omnipaque 350 intravenous contrast. Coronal and sagittal thin MIP reconstructions were provided. Axial, coronal and  sagittal thick MIP reconstructions were also provided for interpretation. COMPARISON:  MRI head 03/08/2021.  CT venogram 03/07/2021. FINDINGS: NON-CONTRAST CT HEAD: Brain: Cerebral volume is normal. There is no acute intracranial hemorrhage. No demarcated cortical infarct. No extra-axial fluid collection. No evidence of intracranial mass. No midline shift. Vascular: No hyperdense vessel. Skull: Normal. Negative for fracture or focal lesion. Sinuses/Orbits: Visualized orbits show no acute finding. No significant paranasal sinus disease at the imaged levels. CT venogram head: Persistent subocclusive thrombus within the inferior aspect of the superior sagittal sinus/confluence of sinuses. Persistent near occlusive thrombus within the left transverse and sigmoid dural venous sinuses. As before, some enhancement is seen within the upper left internal jugular vein. The superior sagittal sinus is otherwise patent. Redemonstrated rounded filling defect within the posterosuperior sagittal sinus, likely reflecting an arachnoid granulation. The internal cerebral veins, vein of [place], straight sinus, right transverse sinus, right sigmoid sinus and visualized right internal jugular vein are patent. IMPRESSION: Persistent subocclusive thrombus within the inferior aspect of the superior sagittal sinus/confluence of sinuses. Persistent near occlusive thrombus throughout the left transverse and sigmoid dural venous sinuses. These findings are similar as compared to the CT venogram of 03/07/2021.  As before, some enhancement is seen within the upper left internal jugular vein. No evidence of acute infarct or intracranial hemorrhage. Electronically Signed   By: Jackey Loge DO   On: 04/30/2021 18:33    Procedures Procedures   Medications Ordered in ED Medications  acetaminophen (TYLENOL) tablet 1,000 mg (has no administration in time range)    ED Course  I have reviewed the triage vital signs and the nursing notes.  Pertinent labs & imaging results that were available during my care of the patient were reviewed by me and considered in my medical decision making (see chart for details).    MDM Rules/Calculators/A&P                          Patient presenting today with headache that is worsened throughout the day.  Most recently she was found to have a cavernous venous dural thrombosis in March 2022 and reports that headache she is experiencing today is very similar.  She has been taking Eliquis and has not missed any doses but today's headache does not feel like her typical migraine.  It is a pressure in the front of her face and she has had intermittent blurry vision.  She denies any nausea or vomiting which she reports is almost always present with her migraines.  She has had some nasal congestion and seasonal allergies but denies any neck pain or fever.  On exam she is well-appearing.  Blood pressure is 139/93 but otherwise vital signs are normal.  Exam without acute findings.  Given recent history and nature of her symptoms today will repeat CT venogram to ensure patient does not have recurrent or extending clot.  She was given Tylenol for headache as she did not want IV medications at this time.  7:09 PM Patient's CT venogram is no different.  She still has the subocclusive and almost completely occlusive clot but it has not worsened.  Speaking with neurology they report that this is fairly typical since patient has only been on Eliquis for 1.5 months.  It usually takes 3 to 4  months to start seeing regression of clot.  Given there is been no worsening patient is otherwise neurologically intact we will have her call her neurologist for up possible earlier  appointment.  Her headache has improved with Tylenol repeat blood pressure is 117/81.  Patient is stable for discharge.  MDM Number of Diagnoses or Management Options   Amount and/or Complexity of Data Reviewed Clinical lab tests: ordered and reviewed Tests in the radiology section of CPT: ordered and reviewed Independent visualization of images, tracings, or specimens: yes     Final Clinical Impression(s) / ED Diagnoses Final diagnoses:  Nonintractable headache, unspecified chronicity pattern, unspecified headache type    Rx / DC Orders ED Discharge Orders    None       Gwyneth SproutPlunkett, Ashtyn Meland, MD 04/30/21 1910

## 2021-04-30 NOTE — Telephone Encounter (Signed)
Pt is calling in for advise-she's having a headache and with all her health issues. She is wondering what she should do. I offered a Mychart video appointment for 05/01/21(tomorrow) and she declined. Please advise at [phone].

## 2021-04-30 NOTE — Discharge Instructions (Signed)
The CAT scan today was no worse.  Continue taking all your medications.  Use Tylenol as needed for headache.

## 2021-06-06 ENCOUNTER — Encounter: Payer: Self-pay | Admitting: Family Medicine

## 2021-06-10 ENCOUNTER — Other Ambulatory Visit: Payer: Self-pay | Admitting: Adult Health

## 2021-06-10 ENCOUNTER — Telehealth: Payer: Self-pay | Admitting: Adult Health

## 2021-06-10 DIAGNOSIS — G08 Intracranial and intraspinal phlebitis and thrombophlebitis: Secondary | ICD-10-CM

## 2021-06-10 DIAGNOSIS — D6859 Other primary thrombophilia: Secondary | ICD-10-CM

## 2021-06-10 NOTE — Telephone Encounter (Signed)
Please remind patient to come into the office this week for repeat lab work.  She will be contacted to schedule repeat imaging to look for resolution of venous sinus thrombosis

## 2021-06-10 NOTE — Progress Notes (Signed)
Opened in error

## 2021-06-11 ENCOUNTER — Telehealth: Payer: Self-pay | Admitting: Adult Health

## 2021-06-11 NOTE — Telephone Encounter (Signed)
CT venogram head: order sent to GI for scheduling. Pt has tricare (NPR)

## 2021-06-12 NOTE — Telephone Encounter (Signed)
Contacted pt, Jasmine [name], advising her to come into the for repeat lab work as she will be contacted for repeat imaging to look for resolution of venous sinus thrombosis. Advised to call the office with questions.

## 2021-06-14 ENCOUNTER — Other Ambulatory Visit: Payer: Self-pay | Admitting: Family Medicine

## 2021-06-14 NOTE — Progress Notes (Signed)
Received mammogram report, which was termed incomplete because of need for additional. images. Orders will be placed following radiology request.  Loyola Mast, MD

## 2021-06-18 ENCOUNTER — Other Ambulatory Visit (INDEPENDENT_AMBULATORY_CARE_PROVIDER_SITE_OTHER)

## 2021-06-18 DIAGNOSIS — Z0289 Encounter for other administrative examinations: Secondary | ICD-10-CM

## 2021-06-18 DIAGNOSIS — G08 Intracranial and intraspinal phlebitis and thrombophlebitis: Secondary | ICD-10-CM

## 2021-06-18 DIAGNOSIS — D6859 Other primary thrombophilia: Secondary | ICD-10-CM

## 2021-06-24 ENCOUNTER — Telehealth: Payer: Self-pay

## 2021-06-24 ENCOUNTER — Other Ambulatory Visit: Payer: Self-pay | Admitting: Adult Health

## 2021-06-24 DIAGNOSIS — R791 Abnormal coagulation profile: Secondary | ICD-10-CM

## 2021-06-24 DIAGNOSIS — D6859 Other primary thrombophilia: Secondary | ICD-10-CM

## 2021-06-24 DIAGNOSIS — G08 Intracranial and intraspinal phlebitis and thrombophlebitis: Secondary | ICD-10-CM

## 2021-06-24 LAB — PROTEIN S PANEL
Protein S Activity: 68 % (ref 63–140)
Protein S Ag, Free: 75 % (ref 61–136)
Protein S Ag, Total: 88 % (ref 60–150)

## 2021-06-24 LAB — LUPUS ANTICOAGULANT PANEL
Dilute Viper Venom Time: 55.5 s — ABNORMAL HIGH (ref 0.0–47.0)
PTT Lupus Anticoagulant: 30.5 s (ref 0.0–51.9)

## 2021-06-24 LAB — BETA-2-GLYCOPROTEIN I ABS, IGG/M/A
Beta-2 Glyco 1 IgA: <9 GPI IgA units (ref 0–25)
Beta-2 Glyco 1 IgM: <9 GPI IgM units (ref 0–32)
Beta-2 Glyco I IgG: <9 GPI IgG units (ref 0–20)

## 2021-06-24 LAB — FACTOR 5 LEIDEN

## 2021-06-24 LAB — CARDIOLIPIN ANTIBODIES, IGG, IGM, IGA
Anticardiolipin IgA: <9 APL U/mL (ref 0–11)
Anticardiolipin IgG: <9 GPL U/mL (ref 0–14)
Anticardiolipin IgM: 12 MPL U/mL (ref 0–12)

## 2021-06-24 LAB — DRVVT CONFIRM: dRVVT Confirm: 0.9 ratio (ref 0.8–1.2)

## 2021-06-24 LAB — DRVVT MIX: dRVVT Mix: 50 s — ABNORMAL HIGH (ref 0.0–40.4)

## 2021-06-24 NOTE — Telephone Encounter (Signed)
Sent pt message via MyChart stating Hi Ms.Lezotte, I wanted to inform you that recent repeat testing showed some abnormal values that was relatively consistent with prior testing. Shanda Bumps put in a referral that will be placed to hematology for further evaluation and input regarding lab work results and ongoing need of anticoagulation. They will be in contact. Let us know if you need anything in the meantime. Have a great day!

## 2021-06-24 NOTE — Telephone Encounter (Signed)
-----   Message from Ihor Austin, NP sent at 06/24/2021  1:41 PM EDT ----- Please advise patient that recent repeat testing showed some abnormal values that was relatively consistent with prior testing.  Referral will be placed to hematology for further evaluation and input regarding lab work results and ongoing need of anticoagulation.

## 2021-06-25 ENCOUNTER — Telehealth: Payer: Self-pay | Admitting: Hematology and Oncology

## 2021-06-25 NOTE — Telephone Encounter (Signed)
Received a new hem referral from [facility] for dural venous sinus thrombosis/thrombophilia. Pt has been cld and scheduled to see Dr. Al Pimple on 7/18 at 11:20am. Pt aware to arrive 20 minutes early.

## 2021-06-26 ENCOUNTER — Encounter: Payer: Self-pay | Admitting: Family Medicine

## 2021-06-27 ENCOUNTER — Encounter: Payer: Self-pay | Admitting: Family Medicine

## 2021-06-27 ENCOUNTER — Other Ambulatory Visit (HOSPITAL_COMMUNITY)
Admission: RE | Admit: 2021-06-27 | Discharge: 2021-06-27 | Disposition: A | Discharge status: Home / Self Care | Source: Ambulatory Visit | Attending: Hematology and Oncology | Admitting: Hematology and Oncology

## 2021-06-27 DIAGNOSIS — R590 Localized enlarged lymph nodes: Secondary | ICD-10-CM

## 2021-06-27 NOTE — Progress Notes (Unsigned)
Received request from [facility] for order for ultrasound-guided biopsy of an enlarged axillary lymph node discovered during an ultrasound follow-up for an abnormal mammogram. I will request mammogram and breast ultrasound reports.  Screening Digital Mammogram (06/12/2021) Impression: Incomplete-Additional Imaging Evaluation Needed. New bilateral lymphadenopathy. A systemic etiology is favored. Targeted bilateral axillary ultrasound is recommended.  Axilla Ultrasound (06/26/2021) Impression: Multiple bilateral enlarged axillary lymph nodes of uncertain etiology. Given family history and bilaterality, a systemic process, potentially extrapulmonary sarcoid or autoimmune process, is favored. Lymphoma is also in the differential. Ultrasound guided biopsy of one of the enlarged axillary lymph nodes is recommended, preferably the most abnormal node on the left labeled #5.  Order signed for ultrasound-guided biopsy.  Loyola Mast, MD

## 2021-07-01 ENCOUNTER — Other Ambulatory Visit: Payer: Self-pay

## 2021-07-01 ENCOUNTER — Encounter: Payer: Self-pay | Admitting: Hematology and Oncology

## 2021-07-01 ENCOUNTER — Inpatient Hospital Stay

## 2021-07-01 ENCOUNTER — Inpatient Hospital Stay: Attending: Hematology and Oncology | Admitting: Hematology and Oncology

## 2021-07-01 VITALS — BP 137/87 | HR 79 | Temp 99.4°F | Resp 18 | Ht 62.0 in | Wt 190.3 lb

## 2021-07-01 DIAGNOSIS — G08 Intracranial and intraspinal phlebitis and thrombophlebitis: Secondary | ICD-10-CM | POA: Diagnosis present

## 2021-07-01 DIAGNOSIS — Z79899 Other long term (current) drug therapy: Secondary | ICD-10-CM | POA: Insufficient documentation

## 2021-07-01 DIAGNOSIS — Z7901 Long term (current) use of anticoagulants: Secondary | ICD-10-CM | POA: Insufficient documentation

## 2021-07-01 LAB — SURGICAL PATHOLOGY

## 2021-07-01 LAB — ANTITHROMBIN III: AntiThromb III Func: 92 % (ref 75–120)

## 2021-07-01 NOTE — Progress Notes (Signed)
[facility] CONSULT NOTE  Patient Care Team: Loyola Mast, MD as PCP - General (Family Medicine) Ihor Austin, NP as Nurse Practitioner (Neurology) Rachel Moulds, MD as Consulting Physician (Hematology and Oncology)  CHIEF COMPLAINTS/PURPOSE OF CONSULTATION:   Dural venous sinus thrombus  ASSESSMENT & PLAN:   This is a very pleasant 45 year old female patient with dural venous sinus thrombosis was diagnosed in March 2022, currently on anticoagulation with Eliquis referred to hematology for anticoagulation recommendations.  She had a recent imaging in May 2022 which did not show any significant change, she continues to have dull headaches, follows up with neurology and is compliant with anticoagulation. We have discussed the following findings with the patient today.  Cerebral venous sinus thrombosis is an uncommon condition with annual incidence ranging from 1.16-2.0-100,000 people and is more common in females than males.  Common risk factors include prothrombotic conditions, obesity, oral contraceptives, pregnancy and puerperium, Malignancy, infection, head injury and some mechanical precipitants.  Most frequent risk factor for CVD in younger women's use of oral contraceptives.  We have also noted several cases of cerebral venous sinus thrombosis after COVID-19 infection even in the absence of predisposing risk factors.  There were some reports describing CVT in patients immunized with [employer] and [employer] vaccine.  For most patients with CVT and symptomatic, we recommend anticoagulation with subcutaneous low molecular weight heparin or IV heparin if there are no contraindications.  Although high-quality evidence is limited, either warfarin or direct oral anticoagulant also appears to be safe and effective to prevent recurrent CVT and other forms of venous thromboembolism in patients with CVT.  For patients with provoked DVT associated with a transient risk  factor, would recommend anticoagulation for 3 to 6 months.  For patients with unprovoked CVT, I recommend anticoagulation for 6 to 12 months.  For patients with recurrent CVT, VTE after CVT or for CVT with severe thrombophilia such as homozygous prothrombin gene, homozygous factor V Leiden, deficiencies of protein C, S or Antithrombin or combined thrombophilia defects or antiphospholipid syndrome we consider anticoagulation indefinitely.  She did have hypercoagulable work-up drawn by her neurologist, lupus anticoagulant is likely falsely positive because of concomitant use of Eliquis, rest of the hypercoagulable work-up was unremarkable.  I recommended that she complete hypercoagulable work-up and if there is no evidence of provoking factor for her DVT, I recommended that she continue anticoagulation for about 6 to 12 months and follow-up with [place] in 4 weeks.  She apparently also had some abnormal axillary lymph nodes which were biopsied, pathology results pending.  I have discussed with patient to make sure that we get the pathology results from the axillary biopsy as well as repeat CT imaging that is to be done in the next 2 days.  She expressed understanding of the recommendations. All her questions were answered to the best my knowledge.  HISTORY OF PRESENTING ILLNESS:   Jasmine [name] 45 y.o. female is here because of dural venous sinus thrombus.  Jasmine [name] is an excellent historian, has a past medical history of migraine and she started noticing a headache in February which was not resembling her migraine exactly.  Her migraine usually last for maximum of 5 to 6 days but this headache lasted for almost a month and 1 day she was also noted to have high blood pressure and some vision changes hence went to the emergency room.  She had a CT head in March 2022 which showed subocclusive thrombus with the  inferior aspect of superior sagittal sinus and confluence of sinuses.  Near occlusive thrombus throughout  the left transverse and sigmoid dural venous sinuses.  Some enhancement in the visualized upper left internal jugular vein. Repeat imaging in May 2022 showed persistent subocclusive thrombus within the inferior aspect of the superior sagittal sinus/confluence of sinuses.  Persistent near occlusive thrombus throughout the left transverse and sigmoid dural venous sinuses.  These findings were similar compared to the CT venogram done in March 2022.  As before some enhancement is seen within the upper left internal jugular vein.  She is following up with neurology had some hypercoagulable work-up results and referred to hematology because of some abnormal results.  She is compliant with her anticoagulation.  She continues to have a dull headache but nothing impairing her activities of daily living.  She is otherwise healthy except for prior history of ectopic pregnancies.  No provoking factors, prior COVID-19 infection, immediate vaccination preceding the venous sinus thrombus, trauma, oral contraceptives or hormone replacement therapy, other major surgeries or known malignancy.  She had a mammogram which was abnormal and had a biopsy of the axillary lymph node, awaiting results.  Sarcoidosis runs in the family. Rest of the pertinent 10 point ROS reviewed and negative.  REVIEW OF SYSTEMS:   Constitutional: Denies fevers, chills or abnormal night sweats Eyes: Denies blurriness of vision, double vision or watery eyes Ears, nose, mouth, throat, and face: Denies mucositis or sore throat Respiratory: Denies cough, dyspnea or wheezes Cardiovascular: Denies palpitation, chest discomfort or lower extremity swelling Gastrointestinal:  Denies nausea, heartburn or change in bowel habits Skin: Denies abnormal skin rashes Lymphatics: Denies new lymphadenopathy or easy bruising Neurological:Denies numbness, tingling or new weaknesses Behavioral/Psych: Mood is stable, no new changes  All other systems were reviewed with  the patient and are negative.  MEDICAL HISTORY:  Past Medical History:  Diagnosis Date   Anxiety    Cerebral venous thrombosis    Depression    Ectopic pregnancy    Migraine    Osteoarthritis    Plantar fasciitis    PTSD (post-traumatic stress disorder)     SURGICAL HISTORY: Past Surgical History:  Procedure Laterality Date   ECTOPIC PREGNANCY SURGERY  10/2000   ECTOPIC PREGNANCY SURGERY  01/2004    SOCIAL HISTORY: Social History   Socioeconomic History   Marital status: Divorced    Spouse name: Not on file   Number of children: Not on file   Years of education: Not on file   Highest education level: Not on file  Occupational History   Not on file  Tobacco Use   Smoking status: Never   Smokeless tobacco: Never  Vaping Use   Vaping Use: Never used  Substance and Sexual Activity   Alcohol use: No   Drug use: No   Sexual activity: Not Currently    Birth control/protection: None  Other Topics Concern   Not on file  Social History Narrative   Not on file   Social Determinants of Health   Financial Resource Strain: Not on file  Food Insecurity: Not on file  Transportation Needs: Not on file  Physical Activity: Not on file  Stress: Not on file  Social Connections: Not on file  Intimate Partner Violence: Not on file    FAMILY HISTORY: Family History  Problem Relation Age of Onset   Hypertension Mother    Seizures Mother     ALLERGIES:  has No Known Allergies.  MEDICATIONS:  Current Outpatient Medications  Medication Sig Dispense Refill   acetaminophen (TYLENOL) 325 MG tablet Take 2 tablets (650 mg total) by mouth every 6 (six) hours as needed for fever or mild pain.     apixaban (ELIQUIS) 5 MG TABS tablet Take 1 tablet (5 mg total) by mouth 2 (two) times daily. 60 tablet 5   escitalopram (LEXAPRO) 10 MG tablet Take 10 mg by mouth at bedtime.     No current facility-administered medications for this visit.    PHYSICAL EXAMINATION: ECOG PERFORMANCE  STATUS: 0 - Asymptomatic  Vitals:   07/01/21 1129  BP: 137/87  Pulse: 79  Resp: 18  Temp: 99.4 F (37.4 C)  SpO2: 100%   Filed Weights   07/01/21 1129  Weight: 190 lb 4.8 oz (86.3 kg)    GENERAL:alert, no distress and comfortable SKIN: skin color, texture, turgor are normal, no rashes or significant lesions EYES: normal, conjunctiva are pink and non-injected, sclera clear OROPHARYNX:no exudate, no erythema and lips, buccal mucosa, and tongue normal  NECK: supple, thyroid normal size, non-tender, without nodularity LYMPH:  no palpable lymphadenopathy in the cervical, axillary or inguinal LUNGS: clear to auscultation and percussion with normal breathing effort HEART: regular rate & rhythm and no murmurs and no lower extremity edema ABDOMEN:abdomen soft, non-tender and normal bowel sounds Musculoskeletal:no cyanosis of digits and no clubbing  PSYCH: alert & oriented x 3 with fluent speech NEURO: no focal motor/sensory deficits  LABORATORY DATA:  I have reviewed the data as listed Lab Results  Component Value Date   WBC 4.8 04/30/2021   HGB 12.7 04/30/2021   HCT 38.4 04/30/2021   MCV 92.3 04/30/2021   PLT 334 04/30/2021     Chemistry      Component Value Date/Time   NA 139 04/30/2021 1657   K 4.1 04/30/2021 1657   CL 105 04/30/2021 1657   CO2 26 04/30/2021 1657   BUN 12 04/30/2021 1657   CREATININE 0.83 04/30/2021 1657      Component Value Date/Time   CALCIUM 9.3 04/30/2021 1657   ALKPHOS 57 03/04/2016 2018   AST 18 03/04/2016 2018   ALT 16 03/04/2016 2018   BILITOT 0.5 03/04/2016 2018       RADIOGRAPHIC STUDIES: I have personally reviewed the radiological images as listed and agreed with the findings in the report. No results found.  All questions were answered. The patient knows to call the clinic with any problems, questions or concerns. I spent 60 minutes in the care of this patient including H and P, review of records, counseling and coordination of  care, Please refer to the above-mentioned assessment and plan for detailed discussion from today.    Rachel Moulds, MD 07/01/2021 1:19 PM

## 2021-07-03 ENCOUNTER — Ambulatory Visit
Admission: RE | Admit: 2021-07-03 | Discharge: 2021-07-03 | Disposition: A | Discharge status: Home / Self Care | Source: Ambulatory Visit | Attending: Adult Health | Admitting: Adult Health

## 2021-07-03 ENCOUNTER — Other Ambulatory Visit: Payer: Self-pay

## 2021-07-03 LAB — PROTEIN C ACTIVITY: Protein C Activity: 121 % (ref 73–180)

## 2021-07-03 IMAGING — CT CT VENOGRAM HEAD
2 of 6 series · 7 of 37 positions shown · IV contrast (iopamidol)
Comparison: [DATE]

CLINICAL DATA: Follow-up venous sinus thrombosis.

EXAM:
CT VENOGRAM HEAD
TECHNIQUE: Venographic phase images of the brain were obtained following the
administration of intravenous contrast. Multiplanar reformats and
maximum intensity projections were generated.
CONTRAST:  75mL [IR] IOPAMIDOL ([IR]) INJECTION 76%

[Series 6: head angio · axial · 0.42mm/px · z∈[-69,+21]mm · 4 of 51 slices shown]
[im 11/51  brain]
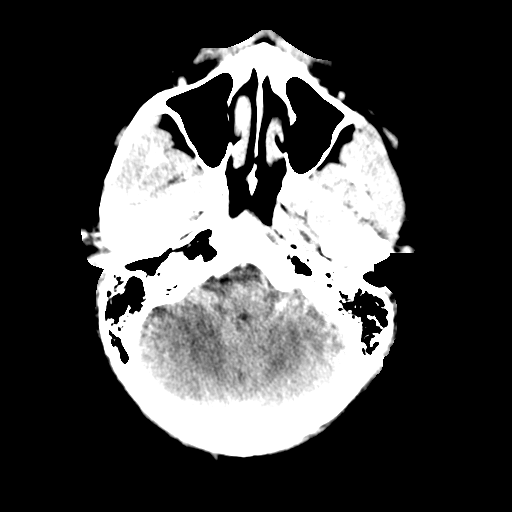
[im 21/51  bone]
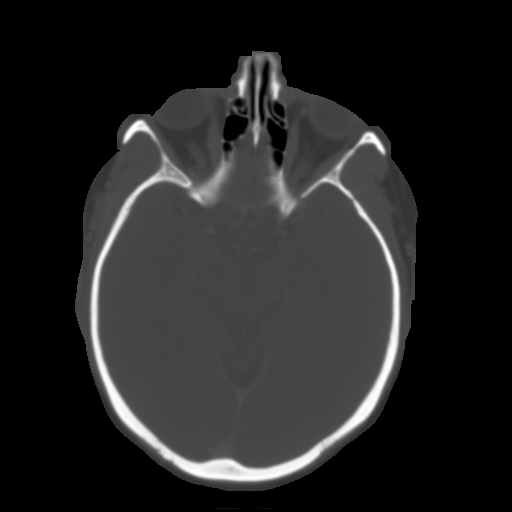
[im 31/51  brain]
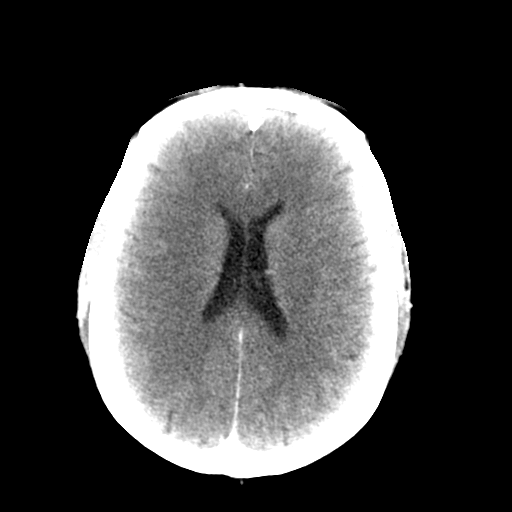
[im 41/51  bone]
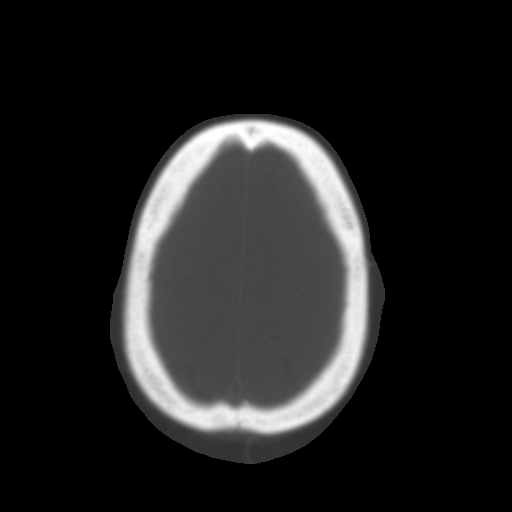

[Series 9: sag w/(date) · sagittal · 0.31mm/px · 3 of 57 slices shown]
[im 19/57  brain]
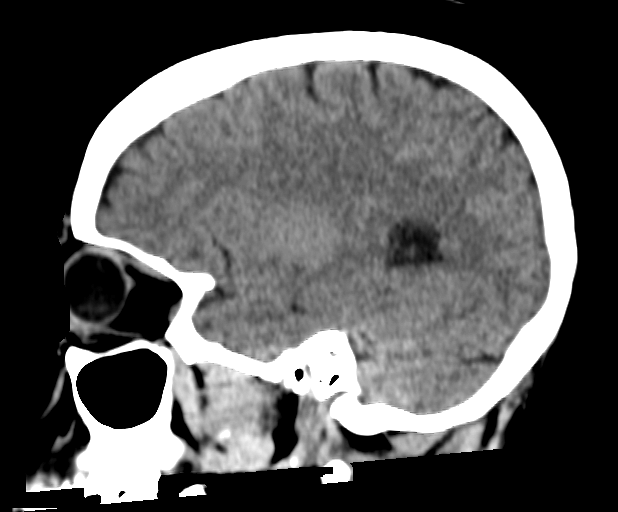
[im 29/57  brain]
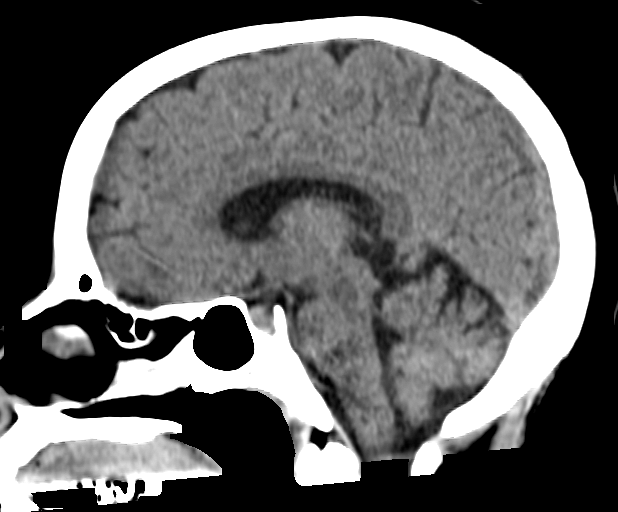
[im 38/57  brain]
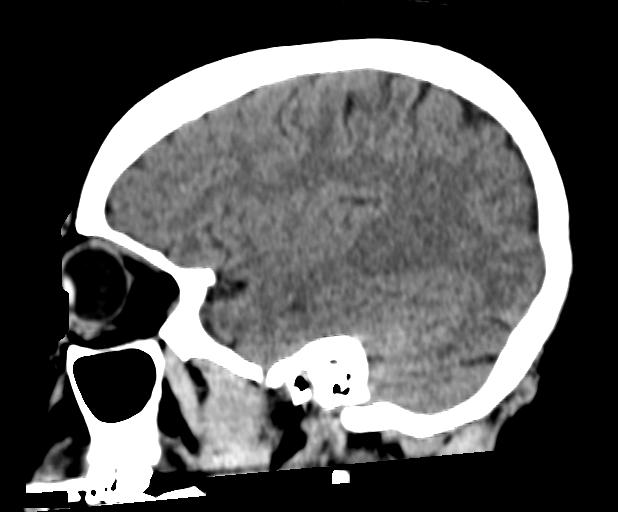

[7 of 37 positions shown; findings below may reference images not displayed]

FINDINGS: NONCONTRAST CT HEAD:

Brain: There is no evidence of an acute infarct, intracranial
hemorrhage, mass, midline shift, or extra-axial fluid collection.
The ventricles and sulci are normal.

Vascular: No hyperdense vessel.

Skull: No fracture or suspicious osseous lesion.

Sinuses/orbits: Paranasal sinuses and mastoid air cells are clear.
Unremarkable orbits.

CT VENOGRAM HEAD:

The patient experienced nausea following contrast injection
resulting in delayed imaging.

Nonocclusive thrombus in the inferior aspect of the superior
sagittal sinus and torcula has not significantly changed. There is
significantly improved patency of the left transverse and left
sigmoid sinuses with some residual nonocclusive thrombus remaining
the left jugular bulb is grossly patent.

A subcentimeter filling defect in the posterosuperior aspect of the
superior sagittal sinus is unchanged and most consistent with an
arachnoid granulation. The internal cerebral veins, vein of PING,
straight sinus, right transverse sinus, and right sigmoid sinus are
patent.
IMPRESSION: 1. Improved patency of the left transverse and left sigmoid sinuses
with some nonocclusive thrombus remaining.
2. Unchanged nonocclusive thrombus in the inferior aspect of the
superior sagittal sinus and torcula.
3. No evidence of acute intracranial abnormality.

## 2021-07-03 MED ORDER — IOPAMIDOL (ISOVUE-370) INJECTION 76%
75.0000 mL | Freq: Once | INTRAVENOUS | Status: AC | PRN
  Administered 2021-07-03: 75 mL via INTRAVENOUS

## 2021-07-04 LAB — PROTHROMBIN GENE MUTATION

## 2021-07-10 ENCOUNTER — Other Ambulatory Visit: Payer: Self-pay

## 2021-07-11 ENCOUNTER — Other Ambulatory Visit (HOSPITAL_COMMUNITY)
Admission: RE | Admit: 2021-07-11 | Discharge: 2021-07-11 | Disposition: A | Discharge status: Home / Self Care | Source: Ambulatory Visit | Attending: Family Medicine | Admitting: Family Medicine

## 2021-07-11 ENCOUNTER — Ambulatory Visit (INDEPENDENT_AMBULATORY_CARE_PROVIDER_SITE_OTHER): Admitting: Family Medicine

## 2021-07-11 VITALS — BP 124/86 | HR 93 | Temp 97.5°F | Ht 62.0 in | Wt 191.0 lb

## 2021-07-11 DIAGNOSIS — Z124 Encounter for screening for malignant neoplasm of cervix: Secondary | ICD-10-CM

## 2021-07-11 DIAGNOSIS — E785 Hyperlipidemia, unspecified: Secondary | ICD-10-CM | POA: Diagnosis not present

## 2021-07-11 DIAGNOSIS — R590 Localized enlarged lymph nodes: Secondary | ICD-10-CM

## 2021-07-11 DIAGNOSIS — G08 Intracranial and intraspinal phlebitis and thrombophlebitis: Secondary | ICD-10-CM

## 2021-07-11 DIAGNOSIS — D649 Anemia, unspecified: Secondary | ICD-10-CM | POA: Diagnosis not present

## 2021-07-11 DIAGNOSIS — N920 Excessive and frequent menstruation with regular cycle: Secondary | ICD-10-CM

## 2021-07-11 LAB — CBC
HCT: 36.8 % (ref 36.0–46.0)
Hemoglobin: 12 g/dL (ref 12.0–15.0)
MCHC: 32.7 g/dL (ref 30.0–36.0)
MCV: 90.1 fl (ref 78.0–100.0)
Platelets: 334 10*3/uL (ref 150.0–400.0)
RBC: 4.08 Mil/uL (ref 3.87–5.11)
RDW: 14.3 % (ref 11.5–15.5)
WBC: 4.7 10*3/uL (ref 4.0–10.5)

## 2021-07-11 LAB — LIPID PANEL
Cholesterol: 164 mg/dL (ref 0–200)
HDL: 46.3 mg/dL (ref >39.00)
LDL Cholesterol: 106 mg/dL — ABNORMAL HIGH (ref 0–99)
NonHDL: 117.85
Total CHOL/HDL Ratio: 4
Triglycerides: 59 mg/dL (ref 0.0–149.0)
VLDL: 11.8 mg/dL (ref 0.0–40.0)

## 2021-07-11 NOTE — Progress Notes (Signed)
[facility] [address] [place] [address] Dept: [phone] Dept Fax: [phone]  Chronic Care Office Visit  Subjective:    Patient ID: Jasmine [name], female    DOB: 04/12/1976, 45 y.o..   MRN: [id number]  Chief Complaint  Patient presents with   Follow-up    3 month f/u, pap/labs.     History of Present Illness:  Patient is in today for reassessment of chronic medical issues.  Ms. Newstrom has a history of having had an acute dural sinus thrombosis in March of this year. She is on anticoagulation with Eliquis. She recently underwent an coagulopathy work-up. She has been seen by both Ms. McCue (neurology and Dr. Al Pimple (hematology). She had a recent CT scan to follow-up on the thrombus. She notes she is feeling well overall and does not note any recent headaches.   Ms. Zinda was noted ot have enlarged axillary lymph nodes during a recent mammogram. This was confirmed with ultrasound. She than had an ultrasound-guided biopsy of the larger lymph node on the left. She has a sister that has sarcoidosis.  Ms. Deland was started on a statin during her hospitalization due to concerns over a borderline high LDL. She did not feel she needed this medication, so at her last visit, we did agree to stop it and follow-up on her fasting lipids.  Ms. Trawick notes that she has been having heavier periods since being on Eliquis. She has a past history of anemia. She has also had prior tubal surgeries, so cannot conceive. She wonders about whether she might be able to have a hysterectomy at this point.  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     apixaban (ELIQUIS) 5 MG TABS tablet Take 1 tablet (5 mg total) by mouth 2 (two) times daily. 60 tablet 5   escitalopram (LEXAPRO) 10 MG tablet Take 10 mg by mouth at bedtime.     No facility-administered medications prior to visit.   No Known Allergies    Objective:   Today's Vitals   07/11/21 0854  BP: 124/86  Pulse: 93  Temp: (!) 97.5 F (36.4 C)  TempSrc: Temporal  SpO2: 97%  Weight: 191 lb (86.6 kg)  Height: 5\' 2"  (1.575 m)   Body mass index is 34.93 kg/m.   General: Well developed, well nourished. No acute distress. GU: Normal female external genitalia. Vaginal mucosa pink with scant blood in vault. Cervix is pink nad   appears normal. No CMT. Uterus normal size. No adnexal masses. Psych: Alert and oriented x3. Normal mood and affect.  Health Maintenance Due  Topic Date Due   Pneumococcal Vaccine 45-73 Years old (1 - PCV) Never done   Hepatitis C  Screening  Never done   PAP SMEAR-Modifier  Never done   COLONOSCOPY (Pts 45-58yrs Insurance coverage will need to be confirmed)  Never done   COVID-19 Vaccine (4 - Booster for Moderna series) 01/03/2021   Imaging: CT Venogram (07/03/2021) IMPRESSION: 1. Improved patency of the left transverse and left sigmoid sinuses with some nonocclusive thrombus remaining. 2. Unchanged nonocclusive thrombus in the inferior aspect of the superior sagittal sinus and torcula. 3. No evidence of acute intracranial abnormality.  Lab Results Lab Results  Component Value Date   WBC  4.8 04/30/2021   HGB 12.7 04/30/2021   HCT 38.4 04/30/2021   MCV 92.3 04/30/2021   PLT 334 04/30/2021   Lab Results  Component Value Date   CHOL 118 04/09/2021   HDL 45 04/09/2021   LDLCALC 61 04/09/2021   TRIG 53 04/09/2021   CHOLHDL 2.6 04/09/2021     Surgical pathology: Left lymph node - No monoclonal B-cell or phenotypically aberrant T-cell population identified.  Assessment & Plan:   1. Dural venous sinus thrombosis Reviewed neurology and hematology consults, and recent coagulopathy labs. No sign of underlying hypercoagulable state. Recent CT scan result shared with Ms. Trang. It shows some progressive resolution of her venous thrombus. Based on hematology assessment, we will continue Eliquis for 1 year. I will plan to see her back in 3 months to reassess.  2. Axillary lymphadenopathy Lymph node biopsy negative for signs of lymphoma. Reassured MS. Mervine.  3. Normocytic anemia Ms. Nielsen has a history of anemia. I will recheck her CBC in light of heavier periods on anticoagulation.  - CBC  4. Hyperlipidemia, unspecified hyperlipidemia type We will recheck lipids today to confirm she is no longer having issues off of her statin.  - Lipid panel  5. Menorrhagia with regular cycle Ms. Mudry would like to have a hysterectomy. I am unsure that she would meet insurance criteria for this, but will refer her to GYN for her to have a discussion about this.  - Ambulatory referral to Gynecology  Loyola Mast, MD

## 2021-07-15 ENCOUNTER — Ambulatory Visit (INDEPENDENT_AMBULATORY_CARE_PROVIDER_SITE_OTHER): Admitting: Adult Health

## 2021-07-15 ENCOUNTER — Encounter: Payer: Self-pay | Admitting: Adult Health

## 2021-07-15 ENCOUNTER — Other Ambulatory Visit: Payer: Self-pay

## 2021-07-15 VITALS — BP 120/86 | HR 77 | Ht 62.0 in | Wt 192.1 lb

## 2021-07-15 DIAGNOSIS — G08 Intracranial and intraspinal phlebitis and thrombophlebitis: Secondary | ICD-10-CM

## 2021-07-15 DIAGNOSIS — R7303 Prediabetes: Secondary | ICD-10-CM

## 2021-07-15 DIAGNOSIS — E785 Hyperlipidemia, unspecified: Secondary | ICD-10-CM | POA: Diagnosis not present

## 2021-07-15 NOTE — Patient Instructions (Addendum)
Continue Eliquis (apixaban) daily  and atorvastatin for secondary stroke prevention  Continue to follow with hematology for Eliquis management and monitoring  We will plan on repeat imaging around February prior to stopping Eliquis  Continue to follow up with PCP regarding cholesterol management  Maintain strict control of hypertension with blood pressure goal below 130/90 and cholesterol with LDL cholesterol (bad cholesterol) goal below 70 mg/dL.       Followup in the future with me in 8 months or call earlier if needed       Thank you for coming to see [place] at [facility]. I hope we have been able to provide you high quality care today.  You may receive a patient satisfaction survey over the next few weeks. We would appreciate your feedback and comments so that we may continue to improve ourselves and the health of our patients.    Cholesterol Content in Foods Cholesterol is a waxy, fat-like substance that helps to carry fat in the blood. The body needs cholesterol in small amounts, but too much cholesterol can causedamage to the arteries and heart. Most people should eat less than 200 milligrams (mg) of cholesterol a day. Foods with cholesterol  Cholesterol is found in animal-based foods, such as meat, seafood, and dairy. Generally, low-fat dairy and lean meats have less cholesterol than full-fat dairy and fatty meats. The milligrams of cholesterol per serving (mg per serving) of common cholesterol-containing foods are listed below. Meat and other proteins Egg -- one large whole egg has 186 mg. Veal shank -- 4 oz has 141 mg. Lean ground [place] (93% lean) -- 4 oz has 118 mg. Fat-trimmed lamb loin -- 4 oz has 106 mg. Lean ground beef (90% lean) -- 4 oz has 100 mg. Lobster -- 3.5 oz has 90 mg. Pork loin chops -- 4 oz has 86 mg. Canned salmon -- 3.5 oz has 83 mg. Fat-trimmed beef top loin -- 4 oz has 78 mg. Frankfurter -- 1 frank (3.5 oz) has 77 mg. Crab --  3.5 oz has 71 mg. Roasted chicken without skin, white meat -- 4 oz has 66 mg. Light bologna -- 2 oz has 45 mg. Deli-cut [place] -- 2 oz has 31 mg. Canned tuna -- 3.5 oz has 31 mg. Tomasa Blase -- 1 oz has 29 mg. Oysters and mussels (raw) -- 3.5 oz has 25 mg. Mackerel -- 1 oz has 22 mg. Trout -- 1 oz has 20 mg. Pork sausage -- 1 link (1 oz) has 17 mg. Salmon -- 1 oz has 16 mg. Tilapia -- 1 oz has 14 mg. Dairy Soft-serve ice cream --  cup (4 oz) has 103 mg. Whole-milk yogurt -- 1 cup (8 oz) has 29 mg. Cheddar cheese -- 1 oz has 28 mg. American cheese -- 1 oz has 28 mg. Whole milk -- 1 cup (8 oz) has 23 mg. 2% milk -- 1 cup (8 oz) has 18 mg. Cream cheese -- 1 tablespoon (Tbsp) has 15 mg. Cottage cheese --  cup (4 oz) has 14 mg. Low-fat (1%) milk -- 1 cup (8 oz) has 10 mg. Sour cream -- 1 Tbsp has 8.5 mg. Low-fat yogurt -- 1 cup (8 oz) has 8 mg. Nonfat Greek yogurt -- 1 cup (8 oz) has 7 mg. Half-and-half cream -- 1 Tbsp has 5 mg. Fats and oils Cod liver oil -- 1 tablespoon (Tbsp) has 82 mg. Butter -- 1 Tbsp has 15 mg. Lard -- 1 Tbsp has 14 mg. Bacon grease --  1 Tbsp has 14 mg. Mayonnaise -- 1 Tbsp has 5-10 mg. Margarine -- 1 Tbsp has 3-10 mg. Exact amounts of cholesterol in these foods may vary depending on specificingredients and brands. Foods without cholesterol Most plant-based foods do not have cholesterol unless you combine them with a food that has cholesterol. Foods without cholesterol include: Grains and cereals. Vegetables. Fruits. Vegetable oils, such as olive, canola, and sunflower oil. Legumes, such as peas, beans, and lentils. Nuts and seeds. Egg whites. Summary The body needs cholesterol in small amounts, but too much cholesterol can cause damage to the arteries and heart. Most people should eat less than 200 milligrams (mg) of cholesterol a day. This information is not intended to replace advice given to you by your health care provider. Make sure you discuss any  questions you have with your healthcare provider. Document Revised: 03/13/2020 Document Reviewed: 04/23/2020 Elsevier Patient Education  2022 [employer].

## 2021-07-15 NOTE — Progress Notes (Signed)
[facility] [address] [place]. [place] [address] [phone]       STROKE FOLLOW UP NOTE  [name] Date of Birth:  Aug 14, 1976 Medical Record Number:  [id number]   Reason for Referral:  stroke follow up    SUBJECTIVE:   CHIEF COMPLAINT:  Chief Complaint  Patient presents with   Follow-up    Rm 3 alone here for 3 month f/u- Reports she has been doing well. No issues/complaints to report today.      HPI:   Today, 07/15/2021, [name] returns for stroke follow-up.  Stable since prior visit without new or reoccurring stroke/TIA symptoms.  Compliant on Eliquis without associated side effects.  PCP discontinued statin as it was not felt to be indicated - LDL 61 03/2021 on statin and LDL 106 06/2021 off statin - plans to repeat in 4 months per pt - she has been trying to make dietary changes and limiting cholesterol intake.  Blood pressure today 120/86.  Repeat hypercoagulable labs show continued elevated dRRVT otherwise unremarkable -referred to hematology for further input on anticoagulation and duration - seen by hematologist Dr. Al Pimple on 7/18 - felt to be falsely positive due to Eliquis use - recommended continued [facility] use for 6 to 12 months and has follow-up visit on 8/17.  Repeat CTV 07/03/2021 showed some resolution of thrombus.  No further concerns at this time.    History provided for reference purposes only Initial visit 04/09/2021 JM: [name] is being seen for hospital follow-up unaccompanied  Doing well since discharge without residual deficits or new/recurrent stroke/TIA symptoms.  Reports compliance on Eliquis 5 mg twice daily and atorvastatin 40 mg daily without associated side effects.  Blood pressure today 138/89.  She has returned back to all prior activities without difficulty  Multiple questions regarding possible clotting disorder and cause of recent event.  She denies any prior known clotting disorder or hx of clots.  Denies use of  birth control pills or estrogen.  She does admit to being dehydrated at that time but has since increased fluid intake.  She does report her mother has a history of autoimmune connective tissue disorder (unsure of specific type) and hypothyroidism.  Sister has history of sarcoidosis.  No further concerns at this time  Stroke admission 03/07/2021 Jasmine [name] is a 45 y.o. female past medical history of anxiety, depression, PTSD, prior ectopic pregnancies, and migraine in the past with last migrainous headache 3 to 4 years prior to admission, who presented to outside hospital on 03/07/2021 for evaluation of headaches and blurred vision that started sometime 3/20 and did not get better and continued to get worse.  Personally reviewed hospitalization pertinent progress notes, lab work and imaging summary provided.  CT venogram showed dural venous sinus thrombosis and was transferred to [facility] for further evaluation.  MRI consistent with previously identified dural sinus thrombosis and negative for acute infarct.  Hypercoagulable labs positive for lupus anticoagulant and low protein S levels.  Initiated Eliquis and aggressive hydration.  Hematology referral placed for further evaluation and decision on duration of anticoagulant.  LDL 111 and initiate atorvastatin 40 mg daily.  History of migraine with presenting headache not consistent with known migraines.  Evaluated by therapies without therapy needs and discharged home in stable condition.  Dural Venous Sinus Thrombosis - Uknown etiology   HA different than her normal migraine CT head: No acute findings    CT venogram: subocclusive thrombus within the inferior aspect of the  SSS and confluence of sinuses. Near occlusive thrombus throughout the left transverse and sigmoid sinuses.    MRI Brain 03/08/21  1. Abnormal flow void within the posterosuperior sagittal sinus, extending into the left transverse and sigmoid sinuses, consistent with previously  identified acute dural sinus thrombosis. No associated venous infarct, hemorrhage, or other complication. 2. Otherwise normal brain MRI. 3. Normal intracranial MRA. 4. Normal MRA of the neck.   2D Echo EF 55-60% Hypercoagulabe labs all negative except as below   DRVVT PTT lupus anticoagulant 0.0 - 47.0 sec 0.0-51.9 sec 57.3 High   29.7    Protein S activity  63-140%  37 point  Protein S Ag, Total 60 - 150 % 96     LDL 111 HgbA1c 6.0 VTE prophylaxis - on heparin gtt. Heparin levels monitored daily; 0.41 today.  No anticoagulant/antiplatelet prior to admission, Transition from heparin drip to Eliquis.  Therapy recommendations:  No follow up.  Dispositio/n:  home     ROS:   14 system review of systems performed and negative with exception of no complaints  PMH:  Past Medical History:  Diagnosis Date   Anxiety    Cerebral venous thrombosis    Depression    Ectopic pregnancy    Migraine    Osteoarthritis    Plantar fasciitis    PTSD (post-traumatic stress disorder)     PSH:  Past Surgical History:  Procedure Laterality Date   ECTOPIC PREGNANCY SURGERY  10/2000   ECTOPIC PREGNANCY SURGERY  01/2004    Social History:  Social History   Socioeconomic History   Marital status: Divorced    Spouse name: Not on file   Number of children: Not on file   Years of education: Not on file   Highest education level: Not on file  Occupational History   Not on file  Tobacco Use   Smoking status: Never   Smokeless tobacco: Never  Vaping Use   Vaping Use: Never used  Substance and Sexual Activity   Alcohol use: No   Drug use: No   Sexual activity: Not Currently    Birth control/protection: None  Other Topics Concern   Not on file  Social History Narrative   Not on file   Social Determinants of Health   Financial Resource Strain: Not on file  Food Insecurity: Not on file  Transportation Needs: Not on file  Physical Activity: Not on file  Stress: Not on file   Social Connections: Not on file  Intimate Partner Violence: Not on file    Family History:  Family History  Problem Relation Age of Onset   Hypertension Mother    Seizures Mother     Medications:   Current Outpatient Medications on File Prior to Visit  Medication Sig Dispense Refill   acetaminophen (TYLENOL) 325 MG tablet Take 2 tablets (650 mg total) by mouth every 6 (six) hours as needed for fever or mild pain.     apixaban (ELIQUIS) 5 MG TABS tablet Take 1 tablet (5 mg total) by mouth 2 (two) times daily. 60 tablet 5   escitalopram (LEXAPRO) 10 MG tablet Take 10 mg by mouth at bedtime.     No current facility-administered medications on file prior to visit.    Allergies:  No Known Allergies    OBJECTIVE:  Physical Exam  Vitals:   07/15/21 0908  BP: 120/86  Pulse: 77  SpO2: 97%  Weight: 192 lb 2 oz (87.1 kg)  Height: 5\' 2"  (1.575 m)  Body mass index is 35.14 kg/m. No results found.  General: well developed, well nourished, very pleasant middle-aged African-American female, seated, in no evident distress Head: head normocephalic and atraumatic.   Neck: supple with no carotid or supraclavicular bruits Cardiovascular: regular rate and rhythm, no murmurs Musculoskeletal: no deformity Skin:  no rash/petichiae Vascular:  Normal pulses all extremities   Neurologic Exam Mental Status: Awake and fully alert.   Fluent speech and language.  Oriented to place and time. Recent and remote memory intact. Attention span, concentration and fund of knowledge appropriate. Mood and affect appropriate.  Cranial Nerves: Pupils equal, briskly reactive to light. Extraocular movements full without nystagmus. Visual fields full to confrontation. Hearing intact. Facial sensation intact. Face, tongue, palate moves normally and symmetrically.  Motor: Normal bulk and tone. Normal strength in all tested extremity muscles Sensory.: intact to touch , pinprick , position and vibratory  sensation.  Coordination: Rapid alternating movements normal in all extremities. Finger-to-nose and heel-to-shin performed accurately bilaterally. Gait and Station: Arises from chair without difficulty. Stance is normal. Gait demonstrates normal stride length and balance without use of assistive device. Tandem walk and heel toe without difficulty.  Reflexes: 1+ and symmetric. Toes downgoing.        ASSESSMENT: Jasmine [name] is a 45 y.o. year old female presented with 4-day onset of headache and blurred vision on 03/07/2021 with evidence of dural venous sinus thrombosis secondary to unknown etiology although concern for underlying hypercoagulable state and low protein S levels. Vascular risk factors include history of migraines, pre-DM, normocytic anemia and HLD.      PLAN:  Dural venous sinus thrombosis:  Continue Eliquis (apixaban) daily for secondary stroke prevention (see below)   Discussed secondary stroke prevention measures and importance of close PCP follow up for aggressive stroke risk factor management  CTV 07/03/2021 improved patency of left transverse and left sigmoid sinuses with some nonocclusive thrombus remaining and unchanged nonocclusive thrombus in the inferior aspect of the superior sagittal sinus and turcula CTV 04/30/2021 persistent subocclusive thrombus in the inferior aspect of the superior sagittal sinus/confluence of sinuses and persistent near occlusive thrombus throughout the left transverse and sigmoid dural venous sinuses CTV 03/07/2021 subocclusive thrombus in the inferior aspect of the superior sagittal sinus and confluence of sinuses and near occlusive thrombus throughout the left transverse and sigmoid dural venous venous sinuses Plan on repeat CTV in 01/2022 prior to discontinuing Eliquis LA normal range but dRVVT modestly elevated in 02/2021 and 06/2021.  Remaining hypercoagulable labs unremarkable. Referred to hematology - OV note personally reviewed from  07/01/2021- felt to be falsely positive due to Eliquis use.  Recommended Eliquis 6 to 12 months.   Continue to follow hematology with follow-up visit 07/31/2021 HLD: LDL goal <70.  Prior LDL 61 (03/2021) down from 564 on atorvastatin 40 mg daily - d/c'd statin in 03/2021 with recent repeat LDL 106 - per pt and PCP, statin therapy is not warranted and has been making dietary changes - discussed recommended levels post stroke and if remains elevated even after dietary changes, would recommend restarting statin at that time Pre-DM: A1c goal<7.  Prior A1c 6.1 -continue to follow with PCP Dr. Veto Kemps for routine monitoring and management if indicated in the future    Follow up in 8 months or call earlier if needed   CC:  [facility] provider: Dr. Pearlean Brownie PCP: Loyola Mast, MD    I spent 36 minutes of face-to-face and non-face-to-face time with patient.  This included previsit  chart review, lab review, study review, electronic health record documentation, patient education regarding dural venous sinus thrombosis and prior repeat imaging and lab work, importance of managing stroke risk factors and answered all other questions to patient satisfaction  Ihor Austin, [facility]  [facility] [address] [place], [place] [address]  Phone [phone] Fax [phone] Note: This document was prepared with digital dictation and possible smart phrase technology. Any transcriptional errors that result from this process are unintentional.

## 2021-07-16 ENCOUNTER — Encounter: Payer: Self-pay | Admitting: Family Medicine

## 2021-07-18 LAB — CYTOLOGY - PAP
Comment: NEGATIVE
Diagnosis: NEGATIVE
High risk HPV: NEGATIVE

## 2021-07-18 NOTE — Progress Notes (Signed)
I agree with the above plan 

## 2021-07-24 ENCOUNTER — Encounter: Payer: Self-pay | Admitting: Family Medicine

## 2021-07-31 ENCOUNTER — Telehealth: Payer: Self-pay | Admitting: *Deleted

## 2021-07-31 ENCOUNTER — Other Ambulatory Visit: Payer: Self-pay

## 2021-07-31 ENCOUNTER — Inpatient Hospital Stay: Attending: Hematology and Oncology | Admitting: Hematology and Oncology

## 2021-07-31 ENCOUNTER — Encounter: Payer: Self-pay | Admitting: Hematology and Oncology

## 2021-07-31 VITALS — BP 134/75 | HR 71 | Temp 97.1°F | Resp 18 | Wt 194.0 lb

## 2021-07-31 DIAGNOSIS — G08 Intracranial and intraspinal phlebitis and thrombophlebitis: Secondary | ICD-10-CM | POA: Diagnosis not present

## 2021-07-31 DIAGNOSIS — Z7901 Long term (current) use of anticoagulants: Secondary | ICD-10-CM | POA: Insufficient documentation

## 2021-07-31 NOTE — Telephone Encounter (Signed)
Received fax form [employer], [employer] review for coverage of healthcare services. Re" [employer] claim for test [phone] F5. Office note printed for supporting documentation of need for test.

## 2021-07-31 NOTE — Progress Notes (Signed)
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    During her initial visit, we have recommended some additional hypercoagulable work to complete the work-up.  Her hypercoagulable work-up was unremarkable except for lupus anticoagulant which is likely false positive because of presence of Eliquis.  We have discussed about anticoagulating for at least 6 to 9 months and if her clinical symptoms improved by the time, she can discontinue anticoagulation and consider baby aspirin 81 mg daily.  She was recommended to refrain from any form of estrogen or hormone replacement therapy in the future. She will also follow-up with neurology for any additional recommendations.  With regards to her lymph node biopsy results, I do not have access to these results however we will try to obtain it from [place].  She can return to clinic for follow-up in early 2023 since she will be following up with her neurologist in September anyway. Age-appropriate cancer screening recommended.  Thank you for consulting [place] in the care of this patient.  Please do not hesitate to contact us with any additional questions or concerns.  HISTORY OF PRESENTING ILLNESS:   Jasmine [name] 45 y.o. female is here because of dural venous sinus thrombus.    Interval history  Jasmine [name] is here for a follow-up.  Since last visit, her headaches have certainly become less prominent.  She continues to be compliant with anticoagulation. She had  another CT since her visit and was wondering to review the images.  She is also recently followed up with her neurologist.  Her hypercoagulable work-up was previously reviewed and unremarkable except for abnormal lupus anticoagulant testing which is likely because of concomitant use of Eliquis. She also mentions that she had a lymph node biopsy at [facility] for abnormal mammogram and this was benign.  Rest of the pertinent 10 point ROS reviewed and negative  REVIEW OF SYSTEMS:   Constitutional: Denies fevers, chills or abnormal night sweats Eyes: Denies blurriness of vision, double vision or watery eyes Ears, nose, mouth, throat, and face: Denies mucositis or sore throat Respiratory: Denies cough, dyspnea or wheezes Cardiovascular: Denies palpitation, chest discomfort or lower extremity swelling Gastrointestinal:  Denies nausea, heartburn or change in bowel habits Skin: Denies abnormal skin rashes Lymphatics: Denies new lymphadenopathy or easy bruising Neurological:Denies numbness, tingling or new weaknesses Behavioral/Psych: Mood is stable, no new changes  All other systems were reviewed with the patient and are negative.  MEDICAL HISTORY:  Past Medical History:  Diagnosis Date   Anxiety    Cerebral venous thrombosis    Depression    Ectopic pregnancy    Migraine    Osteoarthritis    Plantar fasciitis    PTSD (post-traumatic stress disorder)     SURGICAL HISTORY: Past Surgical History:  Procedure Laterality Date   ECTOPIC PREGNANCY SURGERY  10/2000   ECTOPIC PREGNANCY SURGERY  01/2004    SOCIAL HISTORY: Social History   Socioeconomic History   Marital status: Divorced    Spouse name: Not on file   Number of children: Not on file   Years of  education: Not on file   Highest education level: Not on file  Occupational History   Not on file  Tobacco Use   Smoking status: Never   Smokeless tobacco: Never  Vaping Use   Vaping Use: Never used  Substance and Sexual Activity    Alcohol use: No   Drug use: No   Sexual activity: Not Currently    Birth control/protection: None  Other Topics Concern   Not on file  Social History Narrative   Not on file   Social Determinants of Health   Financial Resource Strain: Not on file  Food Insecurity: Not on file  Transportation Needs: Not on file  Physical Activity: Not on file  Stress: Not on file  Social Connections: Not on file  Intimate Partner Violence: Not on file    FAMILY HISTORY: Family History  Problem Relation Age of Onset   Hypertension Mother    Seizures Mother     ALLERGIES:  has No Known Allergies.  MEDICATIONS:  Current Outpatient Medications  Medication Sig Dispense Refill   acetaminophen (TYLENOL) 325 MG tablet Take 2 tablets (650 mg total) by mouth every 6 (six) hours as needed for fever or mild pain.     apixaban (ELIQUIS) 5 MG TABS tablet Take 1 tablet (5 mg total) by mouth 2 (two) times daily. 60 tablet 5   escitalopram (LEXAPRO) 10 MG tablet Take 10 mg by mouth at bedtime.     No current facility-administered medications for this visit.    PHYSICAL EXAMINATION: ECOG PERFORMANCE STATUS: 0 - Asymptomatic  Vitals:   07/31/21 1140  BP: 134/75  Pulse: 71  Resp: 18  Temp: (!) 97.1 F (36.2 C)  SpO2: 95%   Filed Weights   07/31/21 1140  Weight: 194 lb (88 kg)   Physical exam deferred today in lieu of counseling.  LABORATORY DATA:  I have reviewed the data as listed Lab Results  Component Value Date   WBC 4.7 07/11/2021   HGB 12.0 07/11/2021   HCT 36.8 07/11/2021   MCV 90.1 07/11/2021   PLT 334.0 07/11/2021     Chemistry      Component Value Date/Time   NA 139 04/30/2021 1657   K 4.1 04/30/2021 1657   CL 105 04/30/2021 1657   CO2 26 04/30/2021 1657   BUN 12 04/30/2021 1657   CREATININE 0.83 04/30/2021 1657      Component Value Date/Time   CALCIUM 9.3 04/30/2021 1657   ALKPHOS 57 03/04/2016 2018   AST 18 03/04/2016 2018   ALT 16 03/04/2016 2018   BILITOT  0.5 03/04/2016 2018       RADIOGRAPHIC STUDIES: I have personally reviewed the radiological images as listed and agreed with the findings in the report. CT VENOGRAM HEAD  Result Date: 07/03/2021 CLINICAL DATA:  Follow-up venous sinus thrombosis. EXAM: CT VENOGRAM HEAD TECHNIQUE: Venographic phase images of the brain were obtained following the administration of intravenous contrast. Multiplanar reformats and maximum intensity projections were generated. CONTRAST:  18mL ISOVUE-370 IOPAMIDOL (ISOVUE-370) INJECTION 76% COMPARISON:  04/30/2021 FINDINGS: NONCONTRAST CT HEAD: Brain: There is no evidence of an acute infarct, intracranial hemorrhage, mass, midline shift, or extra-axial fluid collection. The ventricles and sulci are normal. Vascular: No hyperdense vessel. Skull: No fracture or suspicious osseous lesion. Sinuses/orbits: Paranasal sinuses and mastoid air cells are clear. Unremarkable orbits. CT VENOGRAM HEAD: The patient experienced nausea following contrast injection resulting in delayed imaging. Nonocclusive thrombus in the inferior aspect of the superior sagittal sinus and  torcula has not significantly changed. There is significantly improved patency of the left transverse and left sigmoid sinuses with some residual nonocclusive thrombus remaining the left jugular bulb is grossly patent. A subcentimeter filling defect in the posterosuperior aspect of the superior sagittal sinus is unchanged and most consistent with an arachnoid granulation. The internal cerebral veins, vein of Galen, straight sinus, right transverse sinus, and right sigmoid sinus are patent. IMPRESSION: 1. Improved patency of the left transverse and left sigmoid sinuses with some nonocclusive thrombus remaining. 2. Unchanged nonocclusive thrombus in the inferior aspect of the superior sagittal sinus and torcula. 3. No evidence of acute intracranial abnormality. Electronically Signed   By: Sebastian Ache M.D.   On: 07/03/2021 16:35     All questions were answered. The patient knows to call the clinic with any problems, questions or concerns. I spent 30 minutes in the care of this patient including history, review of records, counseling and coordination of care, Please refer to the above-mentioned assessment and plan for detailed discussion from today.  We have discussed about anticoagulation recommendations and cerebral venous sinus thrombosis, risk of recurrence, hypercoagulable work-up results and discussed about age-appropriate cancer screening    Rachel Moulds, MD 07/31/2021 11:57 AM

## 2021-08-01 NOTE — Telephone Encounter (Signed)
LDT attestation form filled out, on NP's desk for review, signature.

## 2021-08-06 NOTE — Telephone Encounter (Signed)
[place] paperwork faxed to [employer] med review. Received confirmation.

## 2021-08-13 ENCOUNTER — Encounter: Payer: Self-pay | Admitting: Nurse Practitioner

## 2021-08-13 ENCOUNTER — Other Ambulatory Visit: Payer: Self-pay

## 2021-08-13 ENCOUNTER — Ambulatory Visit (INDEPENDENT_AMBULATORY_CARE_PROVIDER_SITE_OTHER): Admitting: Nurse Practitioner

## 2021-08-13 VITALS — BP 124/78 | Ht 61.0 in | Wt 193.0 lb

## 2021-08-13 DIAGNOSIS — N946 Dysmenorrhea, unspecified: Secondary | ICD-10-CM | POA: Diagnosis not present

## 2021-08-13 DIAGNOSIS — N920 Excessive and frequent menstruation with regular cycle: Secondary | ICD-10-CM | POA: Diagnosis not present

## 2021-08-13 DIAGNOSIS — N921 Excessive and frequent menstruation with irregular cycle: Secondary | ICD-10-CM | POA: Diagnosis not present

## 2021-08-13 NOTE — Progress Notes (Signed)
   Acute Office Visit  Subjective:    Patient ID: Jasmine [name], female    DOB: [birth year], 45 y.o.   MRN: [id number]   HPI 45 y.o. presents today for menorrhagia and BTB. Monthly cycles with light to moderate bleeding days 1-2, heavy bleeding requiring changing of pads every 1-2 hours on day 3, then moderate to light bleeding for 2 days with total bleeding time of 5 days. She has severe dysmenorrhea on day 3. She took Ibuprofen in the past with good management but cannot take due to use of Eliquis. Started on Eliquis March 2022 for venous sinus thromboses. She does feel the bleeding has progressively worsened. BTB occurs mid cycle for a couple of days, bleeding is light. History of ectopic pregnancy x 3, bilateral salpingectomies. Negative for bleeding disorders, hemoglobin 12.0 - checked by neurology 06/2021. She is hoping for hysterectomy.   Review of Systems  Constitutional: Negative.   Gastrointestinal:  Positive for abdominal pain.  Genitourinary:  Positive for menstrual problem.      Objective:    Physical Exam Constitutional:      Appearance: Normal appearance.  Abdominal:     Palpations: Abdomen is soft.     Tenderness: There is no abdominal tenderness.  Genitourinary:    General: Normal vulva.     Vagina: Normal.     Cervix: Normal.     Uterus: Not tender.   Difficult to palpate due to body habitus but no gross masses or tenderness  BP 124/78   Ht 5\' 1"  (1.549 m)   Wt 193 lb (87.5 kg)   LMP 07/31/2021   BMI 36.47 kg/m  Wt Readings from Last 3 Encounters:  08/13/21 193 lb (87.5 kg)  07/31/21 194 lb (88 kg)  07/15/21 192 lb 2 oz (87.1 kg)        Assessment & Plan:   Problem List Items Addressed This Visit   None Visit Diagnoses     Menorrhagia with regular cycle    -  Primary   Relevant Orders   09/14/21 PELVIS TRANSVAGINAL NON-OB (TV ONLY)   Breakthrough bleeding       Relevant Orders   US PELVIS TRANSVAGINAL NON-OB (TV ONLY)   Dysmenorrhea       Relevant  Orders   US PELVIS TRANSVAGINAL NON-OB (TV ONLY)      Plan: We will schedule pelvic ultrasound for further evaluation. We did discuss management options for her symptoms to include IUD, Depo provera, ablation versus hysterectomy if appropriate and these can be discussed in more detail after ultrasound. All questions answered.      [place] DNP, 4:02 PM 08/13/2021

## 2021-08-24 NOTE — Progress Notes (Signed)
GYNECOLOGY  VISIT   HPI: 45 y.o.   Divorced  [place]  female   [phone] with Patient's last menstrual period was 07/31/2021.   here for ultrasound consult.    Heavy regular and painful menses since February, 2022.  Always had horrendous cramping with her cycles.  She takes hot tea, Tylenol, and sleeps to get through the pain the first day of her cycle.  This is impacting her functionality.  She is expecting her cycle to start any day now.   Started Eliquis the end of March, beginning of April. She feels her cycles are heavier now after starting Eliquis.  She has dural venous sinus thromboses.  Her presenting symptom was unbearable headache and blurry vision of her left eye. She is due to have follow up CT in January, 2023.  No other blood clots.  Her work up is negative.  She had a positive LA which was attributed to her Eliquis.   Not able to use tampons because she cannot retain them. Body squeezes them out.  Pad change every 1 - 1.5 hours.   Hgb 12.0 07/11/21.   Not taking iron or doing iron infusions.   She is considering hysterectomy.  Declines future childbearing.  Used Depo Provera in the past and she had bad emotional changes due to this.  Not sexually active for years.  GYNECOLOGIC HISTORY: Patient's last menstrual period was 07/31/2021. Contraception:  None Menopausal hormone therapy:  N/A Last mammogram:  06/06/21 at [facility], [place] bilateral lymphadenopathy. A systemic etiology is favored. Target bilateral axillary ultrasound recommended. Ultrasound completed on 06/26/21, US guided biopsy of the left axilla completed on 06/27/21: Benign reactive lymph node.  Last pap smear:   07/11/21, Neg        OB History     Gravida  3   Para      Term      Preterm      AB  3   Living         SAB      IAB      Ectopic  3   Multiple      Live Births                 Patient Active Problem List   Diagnosis Date Noted   Axillary lymphadenopathy  06/27/2021   Prediabetes 04/10/2021   Normocytic anemia 04/10/2021   Obesity (BMI 35.0-39.9 without comorbidity) 04/10/2021   Anxiety 03/08/2021   Dural venous sinus thrombosis 03/07/2021   MDD (major depressive disorder), recurrent episode, moderate (HCC) 03/05/2016    Past Medical History:  Diagnosis Date   Anxiety    Cerebral venous thrombosis    Depression    Ectopic pregnancy    Migraine    Osteoarthritis    Plantar fasciitis    PTSD (post-traumatic stress disorder)     Past Surgical History:  Procedure Laterality Date   ECTOPIC PREGNANCY SURGERY  10/2000   ECTOPIC PREGNANCY SURGERY  01/2004    Current Outpatient Medications  Medication Sig Dispense Refill   acetaminophen (TYLENOL) 325 MG tablet Take 2 tablets (650 mg total) by mouth every 6 (six) hours as needed for fever or mild pain.     apixaban (ELIQUIS) 5 MG TABS tablet Take 1 tablet (5 mg total) by mouth 2 (two) times daily. 60 tablet 5   escitalopram (LEXAPRO) 10 MG tablet Take 10 mg by mouth at bedtime.     No current facility-administered medications for this visit.  ALLERGIES: Patient has no known allergies.  Family History  Problem Relation Age of Onset   Hypertension Mother    Seizures Mother    Hypertension Sister    Cancer Maternal Grandmother        lung   Cancer Maternal Grandfather        Prostate   Cancer Paternal Grandmother        ? type   Diabetes Paternal Grandfather     Social History   Socioeconomic History   Marital status: Divorced    Spouse name: Not on file   Number of children: Not on file   Years of education: Not on file   Highest education level: Not on file  Occupational History   Not on file  Tobacco Use   Smoking status: Never   Smokeless tobacco: Never  Vaping Use   Vaping Use: Never used  Substance and Sexual Activity   Alcohol use: No   Drug use: No   Sexual activity: Not Currently  Other Topics Concern   Not on file  Social History Narrative   Not  on file   Social Determinants of Health   Financial Resource Strain: Not on file  Food Insecurity: Not on file  Transportation Needs: Not on file  Physical Activity: Not on file  Stress: Not on file  Social Connections: Not on file  Intimate Partner Violence: Not on file    Review of Systems  All other systems reviewed and are negative.  PHYSICAL EXAMINATION:    BP 124/84 (BP Location: Right Arm)   Pulse 84   Resp 20   LMP 07/31/2021     General appearance: alert, cooperative and appears stated age  Pelvic US Uterus 7.37 x 4.25 x 3.74 cm.  EMS 5.69 mm.  Symmetrical.  No masses or thickening.  Fibroids 1.13 cm (intramural and fundal) and 0.79 cm (subserous and anterior). Ovaries atrophic.  No adnexal masses.  No free fluid.   ASSESSMENT  Menorrhagia with regular menses since starting Eliquis.  Status post bilateral salpingectomy for ectopic pregnancies. Dural venous sinus thromboses with negative work up.  Long standing debilitating dysmenorrhea.    PLAN  Pelvic ultrasound report and images reviewed.  We talked about the limitations of pelvic ultrasound to detect endometriosis.  We discussed progesterone treatment options to control pain and bleeding - Micronor, Depo Provera and Mirena IUD.  Risks and benefits reviewed.  She will return for a Mirena IUD insertion with paracervical block.  Will reach out to her neurologist regarding Eliquis discontinuation instructions for the IUD placement.  I am recommending against surgery at this time for treating her dysmenorrhea and menorrhagia.    An After Visit Summary was printed and given to the patient.  33 min  total time was spent for this patient encounter, including preparation, face-to-face counseling with the patient, coordination of care, and documentation of the encounter.

## 2021-08-27 ENCOUNTER — Ambulatory Visit: Admitting: Obstetrics and Gynecology

## 2021-08-27 ENCOUNTER — Ambulatory Visit (INDEPENDENT_AMBULATORY_CARE_PROVIDER_SITE_OTHER)

## 2021-08-27 ENCOUNTER — Encounter: Payer: Self-pay | Admitting: Obstetrics and Gynecology

## 2021-08-27 ENCOUNTER — Other Ambulatory Visit: Payer: Self-pay

## 2021-08-27 VITALS — BP 124/84 | HR 84 | Resp 20

## 2021-08-27 DIAGNOSIS — N921 Excessive and frequent menstruation with irregular cycle: Secondary | ICD-10-CM | POA: Diagnosis not present

## 2021-08-27 DIAGNOSIS — N920 Excessive and frequent menstruation with regular cycle: Secondary | ICD-10-CM | POA: Diagnosis not present

## 2021-08-27 DIAGNOSIS — N946 Dysmenorrhea, unspecified: Secondary | ICD-10-CM

## 2021-08-27 NOTE — Patient Instructions (Addendum)

## 2021-08-28 ENCOUNTER — Other Ambulatory Visit: Payer: Self-pay | Admitting: Obstetrics and Gynecology

## 2021-08-28 ENCOUNTER — Telehealth: Payer: Self-pay | Admitting: Obstetrics and Gynecology

## 2021-08-28 DIAGNOSIS — Z30430 Encounter for insertion of intrauterine contraceptive device: Secondary | ICD-10-CM

## 2021-08-28 NOTE — Telephone Encounter (Signed)
Please reach out to patient's neurologist at [facility] regarding instructions for the patient regarding her Eliquis and upcoming placement of Mirena IUD with paracervical injection of lidocaine for the procedure.   She is taking Eliquis for a history of dural sinus thromboses.

## 2021-08-30 NOTE — Telephone Encounter (Signed)
Sent staff message to patient Neurologist regarding the below.

## 2021-09-02 NOTE — Telephone Encounter (Signed)
Dr. Edward Jolly see the response from Neurologist  "I am not quite sure what the question is.... Does she need to stop Eliquis for placement of Mirena? If so, I would recommend holding off on placement until she completes duration of Eliquis - per oncology/hematology, they recommended at least 6 to 14-month duration.  If she can undergo procedure without stopping Eliquis, she is cleared to do so from a neurological standpoint.  Shanda Bumps, NP

## 2021-09-04 NOTE — Telephone Encounter (Signed)
Left message for patient to call.

## 2021-09-04 NOTE — Telephone Encounter (Signed)
Patient informed. 

## 2021-09-04 NOTE — Telephone Encounter (Signed)
Ok to proceed with Mirena IUD insertion.  She does not need to stop her Eliquis.  She may have a little extra bleeding, but it should be fine.  She does not need to delay this procedure.

## 2021-09-06 ENCOUNTER — Other Ambulatory Visit: Payer: Self-pay

## 2021-09-06 ENCOUNTER — Ambulatory Visit (INDEPENDENT_AMBULATORY_CARE_PROVIDER_SITE_OTHER): Admitting: Obstetrics and Gynecology

## 2021-09-06 ENCOUNTER — Encounter: Payer: Self-pay | Admitting: Obstetrics and Gynecology

## 2021-09-06 VITALS — BP 118/78

## 2021-09-06 DIAGNOSIS — Z01812 Encounter for preprocedural laboratory examination: Secondary | ICD-10-CM

## 2021-09-06 DIAGNOSIS — Z30430 Encounter for insertion of intrauterine contraceptive device: Secondary | ICD-10-CM | POA: Diagnosis not present

## 2021-09-06 LAB — PREGNANCY, URINE: Preg Test, Ur: NEGATIVE

## 2021-09-06 NOTE — Patient Instructions (Signed)
Intrauterine Device Insertion, Care After This sheet gives you information about how to care for yourself after your procedure. Your health care provider may also give you more specific instructions. If you have problems or questions, contact your health care provider. What can I expect after the procedure? After the procedure, it is common to have: Cramps and pain in the abdomen. Bleeding. It may be light or heavy. This may last for a few days. Lower back pain. Dizziness. Headaches. Nausea. Follow these instructions at home:  Before resuming sexual activity, check to make sure that you can feel the IUD string or strings. You should be able to feel the end of the string below the opening of your cervix. If your IUD string is in place, you may resume sexual activity. If you had a hormonal IUD inserted more than 7 days after your most recent period started, you will need to use a backup method of birth control for 7 days after IUD insertion. Ask your health care provider whether this applies to you. Continue to check that the IUD is still in place by feeling for the strings after every menstrual period, or once a month. An IUD will not protect you from sexually transmitted infections (STIs). Use methods to prevent the exchange of body fluids between partners (barrier protection) every time you have sex. Barrier protection can be used during oral, vaginal, or anal sex. Commonly used barrier methods include: Female condom. Female condom. Dental dam. Take over-the-counter and prescription medicines only as told by your health care provider. Keep all follow-up visits as told by your health care provider. This is important. Contact a health care provider if: You feel light-headed or weak. You have any of the following problems with your IUD string or strings: The string bothers or hurts you or your sexual partner. You cannot feel the string. The string has gotten longer. You can feel the IUD in  your vagina. You think you may be pregnant, or you miss your menstrual period. You think you may have a sexually transmitted infection (STI). Get help right away if: You have flu-like symptoms, such as tiredness (fatigue) and muscle aches. You have a fever and chills. You have bleeding that is heavier or lasts longer than a normal menstrual cycle. You have abnormal or bad-smelling discharge from your vagina. You develop abdominal pain that is new, is getting worse, or is not in the same area of earlier cramping and pain. You have pain during sexual activity. Summary After the procedure, it is common to have cramps and pain in the abdomen. It is also common to have light bleeding or heavier bleeding that is like your menstrual period. Continue to check that the IUD is still in place by feeling for the strings after every menstrual period, or once a month. Keep all follow-up visits as told by your health care provider. This is important. Contact your health care provider if you have problems with your IUD strings, such as the string getting longer or bothering you or your sexual partner. This information is not intended to replace advice given to you by your health care provider. Make sure you discuss any questions you have with your health care provider. Document Revised: 11/22/2019 Document Reviewed: 11/22/2019 Elsevier Patient Education  2022 Elsevier Inc.  

## 2021-09-06 NOTE — Progress Notes (Signed)
GYNECOLOGY  VISIT   HPI: 45 y.o.   Divorced  [place]  female   [phone] with Patient's last menstrual period was 08/29/2021.   here for  Mirena insertion.  Has painful and heavy menses.  Pelvic US 08/27/21 showed 2 small intramural and subserosal fibroids. EMS symmetrical and 5/69 mm.  On Eliquis for dural venous sinus thromboses.  Not sexually active.   Status post bilateral salpingectomy.   (UPT negative today.)  GYNECOLOGIC HISTORY: Patient's last menstrual period was 08/29/2021. 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   MDD (major depressive disorder), recurrent episode, moderate (HCC) 03/05/2016    Past Medical History:  Diagnosis Date   Anxiety    Cerebral venous thrombosis    Depression    Ectopic pregnancy    Migraine    Osteoarthritis    Plantar fasciitis    PTSD (post-traumatic stress disorder)     Past Surgical History:  Procedure Laterality Date   ECTOPIC PREGNANCY SURGERY  10/2000   ECTOPIC PREGNANCY SURGERY  01/2004    Current Outpatient Medications  Medication Sig Dispense Refill   acetaminophen (TYLENOL) 325 MG tablet Take 2 tablets (650 mg total) by mouth every 6 (six) hours as needed for fever or mild pain.     apixaban (ELIQUIS) 5 MG TABS tablet Take 1 tablet (5 mg total) by mouth 2 (two) times daily. 60 tablet 5   escitalopram (LEXAPRO) 10 MG tablet Take 10 mg by mouth at bedtime.     No  current facility-administered medications for this visit.     ALLERGIES: Patient has no known allergies.  Family History  Problem Relation Age of Onset   Hypertension Mother    Seizures Mother    Hypertension Sister    Cancer Maternal Grandmother        lung   Cancer Maternal Grandfather        Prostate   Cancer Paternal Grandmother        ? type   Diabetes Paternal Grandfather     Social History   Socioeconomic History   Marital status: Divorced    Spouse name: Not on file   Number of children: Not on file   Years of education: Not on file   Highest education level: Not on file  Occupational History   Not on file  Tobacco Use   Smoking status: Never   Smokeless tobacco: Never  Vaping Use   Vaping Use: Never used  Substance and Sexual Activity   Alcohol use: No   Drug use: No   Sexual activity: Not Currently  Other Topics Concern   Not on file  Social History Narrative   Not on file   Social Determinants of Health   Financial Resource Strain: Not on file  Food Insecurity: Not on file  Transportation Needs: Not on file  Physical Activity: Not on file  Stress: Not on file  Social Connections: Not on file  Intimate Partner Violence: Not on file    Review of Systems  PHYSICAL EXAMINATION:    BP 118/78 (Cuff Size: Normal)   LMP 08/29/2021     General appearance: alert, cooperative and appears stated age  Pelvic: External genitalia:  no lesions              Urethra:  normal appearing urethra with no masses, tenderness or lesions              Bartholins and Skenes: normal                 Vagina: normal appearing vagina with normal color and discharge, no lesions              Cervix: no lesions                Bimanual Exam:  Uterus:  normal size, contour, position, consistency, mobility, non-tender              Adnexa: no mass, fullness, tenderness        Mirena IUD insertion - [id number], exp Nov 2024. Consent for procedure.  Sterile prep with Hibiclens.   Paracervical block 7 cc 1% lidocaine, lot [id number], exp 03/19/23 Tenaculum to anterior cervical lip.  Uterus sounded to 7 cm.  Mirena IUD placed without difficulty.  Strings trimmed and shown to patient.  Repeat BM exam, no change.  Minimal EBL.  No complications.   Chaperone was present for exam:  Malen Gauze.   ASSESSMENT  Mirena IUD insertion.  Status post bilateral salpingectomy.  Menorrhagia and dysmenorrhea. Small fibroids.   PLAN  Post IUD instructions to patient.  IUD card to patient.  FU in 4 weeks.   An After Visit Summary was printed and given to the patient.

## 2021-09-26 ENCOUNTER — Other Ambulatory Visit: Payer: Self-pay | Admitting: Adult Health

## 2021-10-11 ENCOUNTER — Encounter: Payer: Self-pay | Admitting: Family Medicine

## 2021-10-11 ENCOUNTER — Other Ambulatory Visit: Payer: Self-pay

## 2021-10-11 ENCOUNTER — Ambulatory Visit (INDEPENDENT_AMBULATORY_CARE_PROVIDER_SITE_OTHER): Admitting: Family Medicine

## 2021-10-11 VITALS — BP 122/84 | HR 87 | Temp 97.0°F | Wt 192.8 lb

## 2021-10-11 DIAGNOSIS — F419 Anxiety disorder, unspecified: Secondary | ICD-10-CM | POA: Diagnosis not present

## 2021-10-11 DIAGNOSIS — G08 Intracranial and intraspinal phlebitis and thrombophlebitis: Secondary | ICD-10-CM | POA: Diagnosis not present

## 2021-10-11 DIAGNOSIS — F331 Major depressive disorder, recurrent, moderate: Secondary | ICD-10-CM

## 2021-10-11 DIAGNOSIS — Z23 Encounter for immunization: Secondary | ICD-10-CM

## 2021-10-11 NOTE — Progress Notes (Signed)
[facility] [address] [place] [address] Dept: [phone] Dept Fax: [phone]  Chronic Care Office Visit  Subjective:    Patient ID: Jasmine [name], female    DOB: 02/15/1976, 45 y.o..   MRN: [id number]  Chief Complaint  Patient presents with   Follow-up    3 mo f/u. No main concerns.    History of Present Illness:  Patient is in today for reassessment of chronic medical issues.  Ms. Dorrough has a history of an acute dural sinus thrombosis in March of this year. She is on anticoagulation with Eliquis. She has been seen by both Ms. McCue (neurology) and Dr. Al Pimple (hematology). Since her last visit with me, she has seen both providers. The consensus is that she will continue on Eliquis for 1 year. She is only rarely having a headache. She has noted some brief episodes of lightheadedness about once a week. These occurred when she is either laughing hard, straining for a bowel movement, or singing loudly. She feels that she is hydrating normally.   Ms. Ruscitti was started on a statin during her hospitalization due to concerns over a borderline high LDL. She did not feel she needed this medication, so earlier this year, we did agree to stop it and follow-up on her fasting lipids.   Ms. Mehra has been having heavier periods since being on Eliquis. She has a past history of anemia. She has also had prior tubal surgeries, so cannot conceive. I referred her to GYN. They recently placed a Mirena IUD. She notes the one menses she had since then had one day of heavier bleeding and last about 8 days. She recognizes it may take a few cycles to see if the levonorgestrel will help reduce the heaviness of her periods.  Ms. Spagnolo has a past history of PTSD, anxiety, and major depressive disorder. She is currently managed on a low dose of Lexapro and finds these symptoms are doing well.  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 type   Diabetes Paternal Grandfather    Outpatient Medications Prior to Visit  Medication Sig Dispense Refill   acetaminophen (TYLENOL) 325 MG tablet Take 2 tablets (650 mg total) by mouth every 6 (six) hours as needed for fever or mild pain.     ELIQUIS 5 MG TABS tablet TAKE ONE TABLET BY MOUTH TWICE A DAY 60 tablet 4   escitalopram (LEXAPRO) 10 MG tablet Take 10 mg by mouth at bedtime.     No facility-administered medications prior to visit.   No Known Allergies    Objective:   Today's Vitals   10/11/21 0840  BP: 122/84  Pulse: 87  Temp: Marland Kitchen)  97 F (36.1 C)  TempSrc: Temporal  SpO2: 99%  Weight: 192 lb 12.8 oz (87.5 kg)   Body mass index is 36.43 kg/m.   General: Well developed, well nourished. No acute distress. Lungs: Clear to auscultation bilaterally. No wheezing, rales or rhonchi. CV: RRR without murmurs or rubs. Pulses 2+ bilaterally. Extremities: No edema noted. Psych: Alert and oriented. Normal mood and affect.  Health Maintenance Due  Topic Date Due   Pneumococcal Vaccine 37-72 Years old (1 - PCV) Never done   Hepatitis C Screening  Never done   COLONOSCOPY (Pts 45-57yrs Insurance coverage will need to be confirmed)  Never done   COVID-19 Vaccine (4 - Booster for Moderna  series) 11/28/2020     Depression screen [facility] 2/9 10/11/2021 04/10/2021 04/09/2021  Decreased Interest 0 0 0  Down, Depressed, Hopeless 0 0 0  PHQ - 2 Score 0 0 0  Altered sleeping 0 - -  Tired, decreased energy 1 - -  Change in appetite 1 - -  Feeling bad or failure about yourself  0 - -  Trouble concentrating 0 - -  Moving slowly or fidgety/restless 0 - -  Suicidal thoughts 0 - -  PHQ-9 Score 2 - -  Difficult doing work/chores Not difficult at all - -   GAD 7 : Generalized Anxiety Score 10/11/2021  Nervous, Anxious, on Edge 1  Control/stop worrying 0  Worry too much - different things 0  Trouble relaxing 0  Restless 0  Easily annoyed or irritable 1  Afraid - awful might happen 0  Total GAD 7 Score 2  Anxiety Difficulty Not difficult at all   Lab Results: Lab Results  Component Value Date   CHOL 164 07/11/2021   HDL 46.30 07/11/2021   LDLCALC 106 (H) 07/11/2021   TRIG 59.0 07/11/2021   CHOLHDL 4 07/11/2021   CBC Latest Ref Rng & Units 07/11/2021 04/30/2021 03/11/2021  WBC 4.0 - 10.5 K/uL 4.7 4.8 7.6  Hemoglobin 12.0 - 15.0 g/dL 75.1 02.5 11.8(L)  Hematocrit 36.0 - 46.0 % 36.8 38.4 35.9(L)  Platelets 150.0 - 400.0 K/uL 334.0 334 355   Assessment & Plan:   1. Dural venous sinus thrombosis Stable. I reviewed hematology and neurology consult notes. Continue Eliquis. I will see MS. Ha back in late January. We will plan for a repeat CT scan in Feb. to monitor resolution of her thrombus. The goal is to stop her Eliquis by March, if appropriate. Her hematologist suggest she may need to stay on a daily aspirin after that. Ms. Godina LDL remains only slightly outside of the normal range off of statins.  2. Anxiety 3. MDD (major depressive disorder), recurrent episode, moderate (HCC) PHQ-9 and GAD7 support that this is well controlled. Continue Lexapro.  4. Need for influenza vaccination  - Flu Vaccine QUAD 6+ mos PF IM (Fluarix Quad PF)  Loyola Mast, MD

## 2021-10-14 ENCOUNTER — Other Ambulatory Visit: Payer: Self-pay

## 2021-10-14 ENCOUNTER — Encounter: Payer: Self-pay | Admitting: Obstetrics and Gynecology

## 2021-10-14 ENCOUNTER — Ambulatory Visit (INDEPENDENT_AMBULATORY_CARE_PROVIDER_SITE_OTHER): Admitting: Obstetrics and Gynecology

## 2021-10-14 VITALS — BP 110/68 | HR 72 | Ht 61.0 in | Wt 193.0 lb

## 2021-10-14 DIAGNOSIS — Z30431 Encounter for routine checking of intrauterine contraceptive device: Secondary | ICD-10-CM

## 2021-10-14 NOTE — Progress Notes (Signed)
GYNECOLOGY  VISIT   HPI: 45 y.o.   Divorced  [place]  female   [phone] with Patient's last menstrual period was 09/19/2021 (exact date).   here for Mirena IUD check.  Bled for 8 days with a cycle and spotting since then.  Not painful.   Not sexually active since IUD placed.   Has not checked IUD strings.   Hot flashes are not as frequent.   GYNECOLOGIC HISTORY: Patient's last menstrual period was 09/19/2021 (exact date). Contraception:  Mirena IUD 09-06-21/Bil.salpingectomy Menopausal hormone therapy: none Last mammogram: 06-06-21 prominent lymph nodes;Rec.bil.axillary U/S. 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   MDD (major depressive disorder), recurrent episode, moderate (HCC) 03/05/2016    Past Medical History:  Diagnosis Date   Anxiety    Cerebral venous thrombosis    Depression    Ectopic pregnancy    Migraine    Osteoarthritis    Plantar fasciitis    PTSD (post-traumatic stress disorder)     Past Surgical History:  Procedure Laterality Date   ECTOPIC PREGNANCY SURGERY  10/2000   ECTOPIC PREGNANCY SURGERY  01/2004    Current Outpatient Medications  Medication Sig Dispense Refill   acetaminophen (TYLENOL) 325 MG tablet Take 2 tablets (650 mg total) by mouth every 6 (six) hours as needed for fever or mild pain.     ELIQUIS 5 MG TABS tablet TAKE ONE TABLET BY MOUTH TWICE A DAY 60 tablet 4   escitalopram (LEXAPRO) 10 MG tablet Take 10 mg by mouth at bedtime.     levonorgestrel (MIRENA) 20 MCG/DAY  IUD 1 each by Intrauterine route once.     No current facility-administered medications for this visit.     ALLERGIES: Patient has no known allergies.  Family History  Problem Relation Age of Onset   Hypertension Mother    Seizures Mother    Hypertension Sister    Cancer Maternal Grandmother        lung   Cancer Maternal Grandfather        Prostate   Cancer Paternal Grandmother        ? type   Diabetes Paternal Grandfather     Social History   Socioeconomic History   Marital status: Divorced    Spouse name: Not on file   Number of children: Not on file   Years of education: Not on file   Highest education level: Not on file  Occupational History   Not on file  Tobacco Use   Smoking status: Never   Smokeless tobacco: Never  Vaping Use   Vaping Use: Never used  Substance and Sexual Activity   Alcohol use: No   Drug use: No   Sexual activity: Not Currently  Other Topics Concern   Not on file  Social History Narrative   Not on file   Social Determinants of Health   Financial Resource Strain: Not on file  Food Insecurity: Not on file  Transportation Needs: Not on file  Physical Activity: Not on file  Stress: Not on file  Social Connections: Not on file  Intimate Partner Violence: Not on file    Review of Systems  All other systems reviewed and are negative.  PHYSICAL EXAMINATION:    BP 110/68   Pulse 72   Ht 5\' 1"  (1.549 m)   Wt 193 lb (87.5 kg)   LMP 09/19/2021 (Exact Date) Comment: bleeding off & on since Mirena IUD insertion  SpO2 98%   BMI 36.47 kg/m     General appearance: alert, cooperative and appears stated age  Pelvic: External genitalia:  no lesions              Urethra:  normal appearing urethra with no masses, tenderness or lesions              Bartholins and Skenes: normal                 Vagina: normal appearing vagina with normal color and discharge, no lesions              Cervix: no lesions.  IUD strings noted to be about 3.5 cm  long.  Attempt to trim strings aborted as scissors had a curve to the tip making it difficult to do this in a thin Pederson speculum.  Strings left at original length.               Bimanual Exam:  Uterus:  normal size, contour, position, consistency, mobility, non-tender              Adnexa: no mass, fullness, tenderness    Chaperone was present for exam:  11/19/2021, CMA.   ASSESSMENT  Mirena IUD check up.  Doing well. On Eliquis.   PLAN  We discussed bleeding profiles with Mirena IUD.  Reassurance given regarding contraceptive benefit and 8 year use of Mirena.  Fu for yearly exam and prn.   An After Visit Summary was printed and given to the patient.

## 2021-10-16 ENCOUNTER — Ambulatory Visit: Admitting: Obstetrics and Gynecology

## 2021-12-30 NOTE — Progress Notes (Signed)
[facility] CONSULT NOTE  Patient Care Team: Loyola Mast, MD as PCP - General (Family Medicine) Ihor Austin, NP as Nurse Practitioner (Neurology) Rachel Moulds, MD as Consulting Physician (Hematology and Oncology) Ardell Isaacs, Forrestine Him, MD as Consulting Physician (Obstetrics and Gynecology)  CHIEF COMPLAINTS/PURPOSE OF CONSULTATION:   Dural venous sinus thrombus  ASSESSMENT & PLAN:   This is a very pleasant 46 year old female patient with dural venous sinus thrombosis was diagnosed in March 2022, currently on anticoagulation with Eliquis referred to hematology for anticoagulation recommendations.    Her hypercoagulable work-up was unremarkable except for lupus anticoagulant which is likely false positive because of presence of Eliquis.  We have discussed about anticoagulating for at least 6 to 9 months and if her clinical symptoms improved by the time, she can discontinue anticoagulation and consider baby aspirin 81 mg daily.  She was recommended to refrain from any form of estrogen or hormone replacement therapy in the future.  She is here for follow-up.  Since last visit, she has definitely noticed improved blood pressure and headaches and is hoping for complete resolution of thrombus.  She continues with Eliquis and denies any major bleeding issues. We will repeat a CT venogram.  If she continues to have improving dural venous sinus thrombus, we can discontinue Eliquis and start her on baby aspirin.  She understands the risk of recurrence which is around 8% for dural venous sinus thrombus.  She will also follow-up with her neurologist for additional recommendations. We will repeat lupus anticoagulant once she is off of Eliquis, repeat labs ordered for about 3 months from now. Family history of ovarian cancer in mom hence referred to genetics.  Patient is agreeable.  She was instructed to call us back if she did not hear from radiology about her image scheduling.   She expressed understanding.  Age-appropriate cancer screening recommended.  Thank you for consulting [place] in the care of this patient.  Please do not hesitate to contact us with any additional questions or concerns.  HISTORY OF PRESENTING ILLNESS:   Jasmine [name] 46 y.o. female is here because of dural venous sinus thrombus.    Interval history  Jasmine [name] is here for a follow-up.   Since last visit, her headaches have been less bothersome. She continues to be compliant with anticoagulation.  No bleeding complaints.  She is on Mirena for menorrhagia. She is also noticed that her blood pressure which used to be high when she had dural venous sinus thrombus is better and she is hoping that the imaging will show resolution of thrombus.  She is to follow-up with her neurologist in March. She otherwise has been doing very well since her last visit.  She does mention family history of ovarian cancer in her mom.  Rest of the pertinent 10 point ROS reviewed and negative   MEDICAL HISTORY:  Past Medical History:  Diagnosis Date   Anxiety    Cerebral venous thrombosis    Depression    Ectopic pregnancy    Migraine    Osteoarthritis    Plantar fasciitis    PTSD (post-traumatic stress disorder)     SURGICAL HISTORY: Past Surgical History:  Procedure Laterality Date   ECTOPIC PREGNANCY SURGERY  10/2000   ECTOPIC PREGNANCY SURGERY  01/2004    SOCIAL HISTORY: Social History   Socioeconomic History   Marital status: Divorced    Spouse name: Not on file   Number of children: Not on file  Years of education: Not on file   Highest education level: Not on file  Occupational History   Not on file  Tobacco Use   Smoking status: Never   Smokeless tobacco: Never  Vaping Use   Vaping Use: Never used  Substance and Sexual Activity   Alcohol use: No   Drug use: No   Sexual activity: Not Currently  Other Topics Concern   Not on file  Social History Narrative   Not on file    Social Determinants of Health   Financial Resource Strain: Not on file  Food Insecurity: Not on file  Transportation Needs: Not on file  Physical Activity: Not on file  Stress: Not on file  Social Connections: Not on file  Intimate Partner Violence: Not on file    FAMILY HISTORY: Family History  Problem Relation Age of Onset   Hypertension Mother    Seizures Mother    Hypertension Sister    Cancer Maternal Grandmother        lung   Cancer Maternal Grandfather        Prostate   Cancer Paternal Grandmother        ? type   Diabetes Paternal Grandfather     ALLERGIES:  has No Known Allergies.  MEDICATIONS:  Current Outpatient Medications  Medication Sig Dispense Refill   acetaminophen (TYLENOL) 325 MG tablet Take 2 tablets (650 mg total) by mouth every 6 (six) hours as needed for fever or mild pain.     ELIQUIS 5 MG TABS tablet TAKE ONE TABLET BY MOUTH TWICE A DAY 60 tablet 4   escitalopram (LEXAPRO) 10 MG tablet Take 10 mg by mouth at bedtime.     levonorgestrel (MIRENA) 20 MCG/DAY IUD 1 each by Intrauterine route once.     No current facility-administered medications for this visit.    PHYSICAL EXAMINATION: ECOG PERFORMANCE STATUS: 0 - Asymptomatic  Vitals:   12/31/21 0835  BP: 121/70  Pulse: 78  Resp: 18  Temp: 97.6 F (36.4 C)  SpO2: 100%    Filed Weights   12/31/21 0835  Weight: 193 lb 8 oz (87.8 kg)   Physical Exam Constitutional:      Appearance: Normal appearance.  HENT:     Head: Normocephalic and atraumatic.  Cardiovascular:     Rate and Rhythm: Normal rate and regular rhythm.     Pulses: Normal pulses.     Heart sounds: Normal heart sounds.  Pulmonary:     Effort: Pulmonary effort is normal.     Breath sounds: Normal breath sounds.  Musculoskeletal:        General: No swelling or tenderness.  Skin:    General: Skin is warm and dry.  Neurological:     Mental Status: She is alert and oriented to person, place, and time.      LABORATORY DATA:  I have reviewed the data as listed Lab Results  Component Value Date   WBC 4.7 07/11/2021   HGB 12.0 07/11/2021   HCT 36.8 07/11/2021   MCV 90.1 07/11/2021   PLT 334.0 07/11/2021     Chemistry      Component Value Date/Time   NA 139 04/30/2021 1657   K 4.1 04/30/2021 1657   CL 105 04/30/2021 1657   CO2 26 04/30/2021 1657   BUN 12 04/30/2021 1657   CREATININE 0.83 04/30/2021 1657      Component Value Date/Time   CALCIUM 9.3 04/30/2021 1657   ALKPHOS 57 03/04/2016 2018   AST  18 03/04/2016 2018   ALT 16 03/04/2016 2018   BILITOT 0.5 03/04/2016 2018       RADIOGRAPHIC STUDIES: I have personally reviewed the radiological images as listed and agreed with the findings in the report. No results found.  All questions were answered. The patient knows to call the clinic with any problems, questions or concerns. I spent 30 minutes in the care of this patient including history, review of records, counseling and coordination of care, Please refer to the above-mentioned assessment and plan for detailed discussion from today.  We have discussed about anticoagulation recommendations and cerebral venous sinus thrombosis, risk of recurrence, age-appropriate cancer screening and role of genetic testing.    Rachel MouldsPraveena Khale Nigh, MD 12/31/2021 9:05 AM

## 2021-12-31 ENCOUNTER — Other Ambulatory Visit: Payer: Self-pay

## 2021-12-31 ENCOUNTER — Inpatient Hospital Stay: Attending: Hematology and Oncology | Admitting: Hematology and Oncology

## 2021-12-31 ENCOUNTER — Encounter: Payer: Self-pay | Admitting: Hematology and Oncology

## 2021-12-31 VITALS — BP 121/70 | HR 78 | Temp 97.6°F | Resp 18 | Ht 61.0 in | Wt 193.5 lb

## 2021-12-31 DIAGNOSIS — Z7901 Long term (current) use of anticoagulants: Secondary | ICD-10-CM | POA: Diagnosis not present

## 2021-12-31 DIAGNOSIS — Z801 Family history of malignant neoplasm of trachea, bronchus and lung: Secondary | ICD-10-CM | POA: Insufficient documentation

## 2021-12-31 DIAGNOSIS — Z8041 Family history of malignant neoplasm of ovary: Secondary | ICD-10-CM | POA: Diagnosis not present

## 2021-12-31 DIAGNOSIS — Z79899 Other long term (current) drug therapy: Secondary | ICD-10-CM | POA: Insufficient documentation

## 2021-12-31 DIAGNOSIS — Z86718 Personal history of other venous thrombosis and embolism: Secondary | ICD-10-CM | POA: Insufficient documentation

## 2021-12-31 DIAGNOSIS — G08 Intracranial and intraspinal phlebitis and thrombophlebitis: Secondary | ICD-10-CM

## 2021-12-31 DIAGNOSIS — Z793 Long term (current) use of hormonal contraceptives: Secondary | ICD-10-CM | POA: Insufficient documentation

## 2022-01-01 ENCOUNTER — Telehealth: Payer: Self-pay | Admitting: Hematology and Oncology

## 2022-01-01 NOTE — Telephone Encounter (Signed)
Sch per 11/7 inbasket, left msg 

## 2022-01-10 ENCOUNTER — Other Ambulatory Visit: Payer: Self-pay

## 2022-01-13 ENCOUNTER — Ambulatory Visit (INDEPENDENT_AMBULATORY_CARE_PROVIDER_SITE_OTHER): Admitting: Family Medicine

## 2022-01-13 ENCOUNTER — Other Ambulatory Visit: Payer: Self-pay

## 2022-01-13 VITALS — BP 132/84 | HR 78 | Temp 97.6°F | Ht 61.0 in | Wt 191.2 lb

## 2022-01-13 DIAGNOSIS — Z975 Presence of (intrauterine) contraceptive device: Secondary | ICD-10-CM | POA: Insufficient documentation

## 2022-01-13 DIAGNOSIS — G08 Intracranial and intraspinal phlebitis and thrombophlebitis: Secondary | ICD-10-CM

## 2022-01-13 DIAGNOSIS — F4310 Post-traumatic stress disorder, unspecified: Secondary | ICD-10-CM

## 2022-01-13 DIAGNOSIS — F331 Major depressive disorder, recurrent, moderate: Secondary | ICD-10-CM

## 2022-01-13 NOTE — Progress Notes (Signed)
[facility] [address] [place] [address] Dept: [phone] Dept Fax: [phone]  Chronic Care Office Visit  Subjective:    Patient ID: Jasmine [name], female    DOB: 06/24/1976, 46 y.o..   MRN: [id number]  Chief Complaint  Patient presents with   Follow-up    3 month f/u.  No concerns.      History of Present Illness:  Patient is in today for reassessment of chronic medical issues.  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 She is on anticoagulation with Eliquis. She has been seen by both Jasmine [name] (neurology) and Jasmine [name] (hematology). She is scheduled for a repeat CT Venogram later this week. If this shows further resolution of her thrombus, the plan is to stop Eliquis and have her take a daily aspirin. As part of a hypercoagulability workup, she had a prolonged dRVVT. Her hematologist thinks this may have been a false-positive related to her Eliquis. Jasmine [name] plans to repeat this test after she is off of Eliquis.    Jasmine [name] has a past history of PTSD, anxiety, and major depressive disorder. She is currently managed on a low dose of Lexapro and finds these symptoms are doing well.  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 type   Diabetes Paternal Grandfather    Outpatient Medications Prior to Visit  Medication Sig Dispense Refill   acetaminophen (TYLENOL) 325 MG tablet Take 2 tablets (650 mg total) by mouth every 6 (six) hours as needed for fever or mild pain.     ELIQUIS 5 MG TABS tablet TAKE ONE TABLET BY MOUTH TWICE A DAY 60 tablet 4   escitalopram (LEXAPRO) 10 MG tablet Take 10 mg by mouth at bedtime.     levonorgestrel (MIRENA) 20 MCG/DAY IUD 1 each by Intrauterine route once.     No facility-administered medications prior to visit.   No Known Allergies    Objective:   Today's Vitals   01/13/22 1058  BP: 132/84  Pulse: 78  Temp: 97.6 F (36.4 C)  TempSrc: Temporal  SpO2: 98%  Weight: 191 lb 3.2 oz (86.7 kg)  Height: 5\' 1"  (1.549 m)   Body mass index is 36.13 kg/m.   General: Well developed, well nourished. No acute distress. Psych: Alert and oriented. Normal mood and affect.  Health Maintenance Due  Topic Date Due   Hepatitis C Screening  Never done   COLONOSCOPY (Pts 45-56yrs Insurance coverage will need to be confirmed)  Never done   COVID-19 Vaccine (4 - Booster for Moderna series) 11/28/2020   Depression screen [facility] 2/9 01/13/2022 10/11/2021 04/10/2021  Decreased Interest 0 0 0  Down, Depressed, Hopeless 0 0 0  PHQ - 2 Score 0 0 0  Altered sleeping 0 0 -  Tired, decreased energy 1 1 -  Change in appetite 0 1 -  Feeling bad or failure about yourself  0 0 -  Trouble concentrating 0 0 -  Moving slowly or fidgety/restless 0 0 -  Suicidal thoughts 0 0 -  PHQ-9 Score 1 2 -  Difficult doing work/chores Not difficult at all Not difficult at all -   GAD 7 : Generalized Anxiety Score 01/13/2022 10/11/2021  Nervous, Anxious, on Edge 1 1  Control/stop worrying 0 0  Worry too much - different things 0 0  Trouble relaxing 0 0  Restless 0 0  Easily annoyed or irritable 1 1  Afraid - awful might happen 0 0  Total  GAD 7 Score 2 2  Anxiety Difficulty Not difficult at all Not difficult at all    Assessment & Plan:   1. Post traumatic stress disorder (PTSD) 2. MDD (major depressive disorder), recurrent episode, moderate (HCC) Jasmine [name] is well managed with her Lexapro and the coping skills she has developed for management of mood disorders. She notes the [facility] is reassessing her disability rating and has noted that her PTSD has "resolved". I believe that this is a misinterpretation of her condition. Her PTSD is well managed through medication and behavioral approaches, but still exists and would likely flare without ongoing treatment.  3. Dural venous sinus thrombosis CT later this week. Continue Eliquis for now, but plan is to transition to aspirin.  Loyola Mast, MD

## 2022-01-15 ENCOUNTER — Other Ambulatory Visit: Payer: Self-pay

## 2022-01-15 ENCOUNTER — Ambulatory Visit (HOSPITAL_COMMUNITY)
Admission: RE | Admit: 2022-01-15 | Discharge: 2022-01-15 | Disposition: A | Discharge status: Home / Self Care | Source: Ambulatory Visit | Attending: Hematology and Oncology | Admitting: Hematology and Oncology

## 2022-01-15 DIAGNOSIS — G08 Intracranial and intraspinal phlebitis and thrombophlebitis: Secondary | ICD-10-CM | POA: Insufficient documentation

## 2022-01-15 LAB — POCT I-STAT CREATININE: Creatinine, Ser: 1 mg/dL (ref 0.44–1.00)

## 2022-01-15 IMAGING — CT CT VENOGRAM HEAD
3 of 7 series · 18 of 47 positions shown · IV contrast (OMNIPAQUE)
Comparison: CT venogram head [DATE]

CLINICAL DATA: Follow-up dural venous sinus thrombosis.

EXAM:
CT VENOGRAM HEAD
TECHNIQUE: Venographic phase images of the brain were obtained following the
administration of intravenous contrast. Multiplanar reformats and
maximum intensity projections were generated.

[Series 4: cor soft · coronal · 0.31mm/px · 2 of 65 slices shown]
[im 22/65  brain]
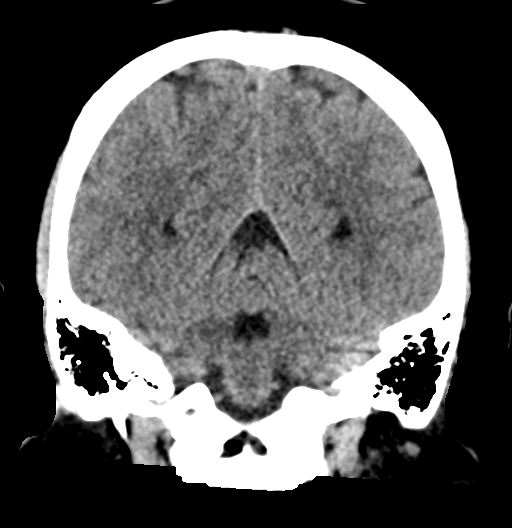
[im 43/65  brain]
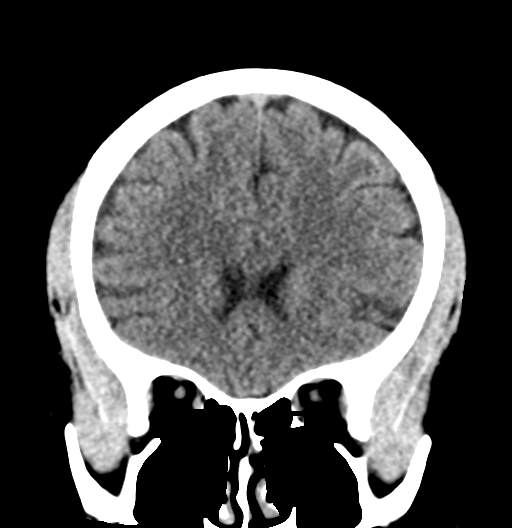

[Series 5: sag soft · sagittal · 0.32mm/px · 1 of 53 slices shown]
[im 27/53  brain]
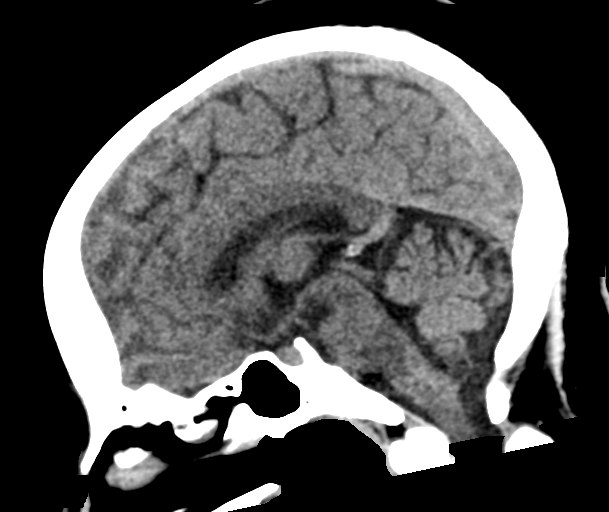

[Series 6: head venogram · axial · 0.39mm/px · z∈[-101,+37]mm · 15 of 79 slices shown]
[im 5/79  brain]
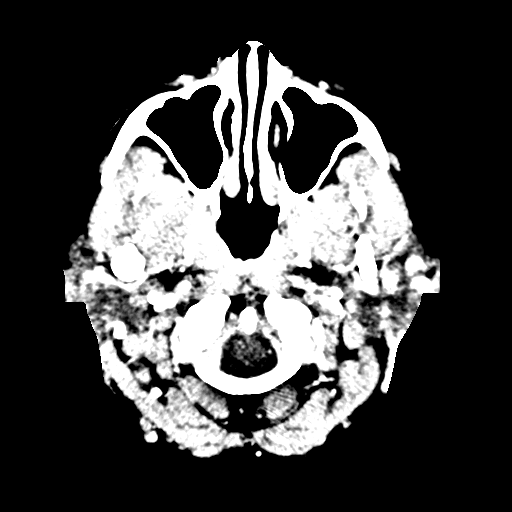
[im 10/79  bone]
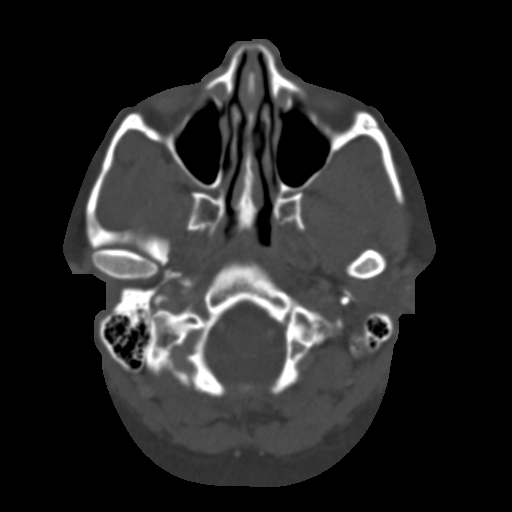
[im 15/79  brain]
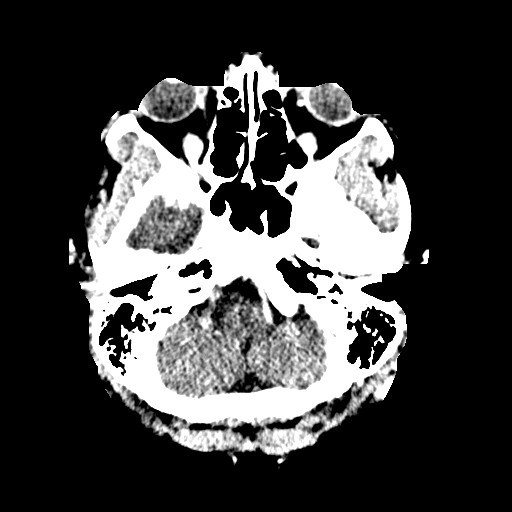
[im 20/79  bone]
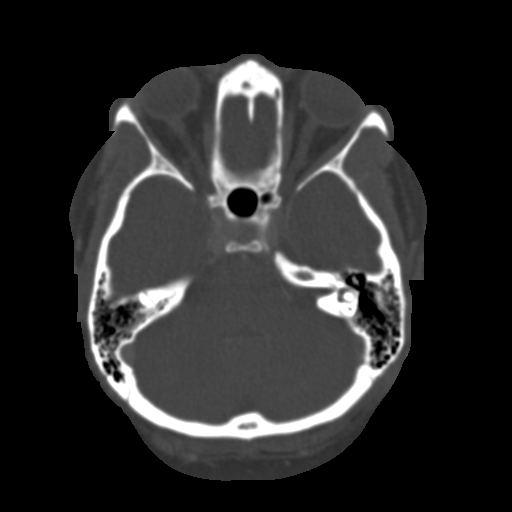
[im 25/79  brain]
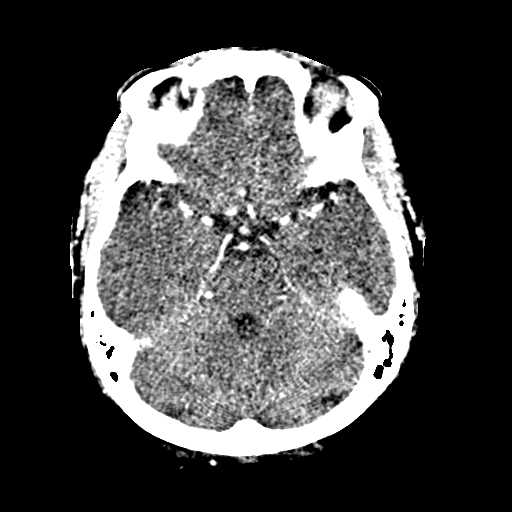
[im 30/79  bone]
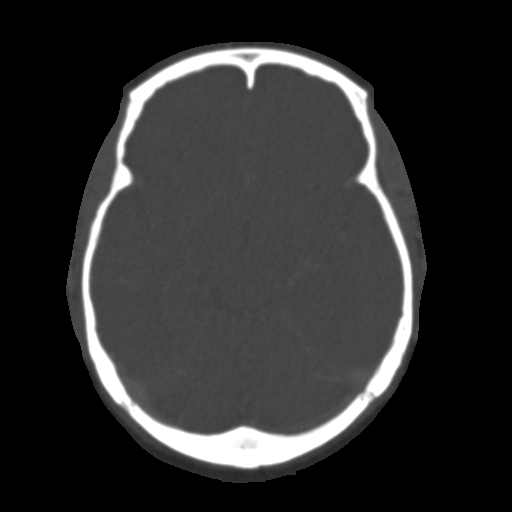
[im 35/79  brain]
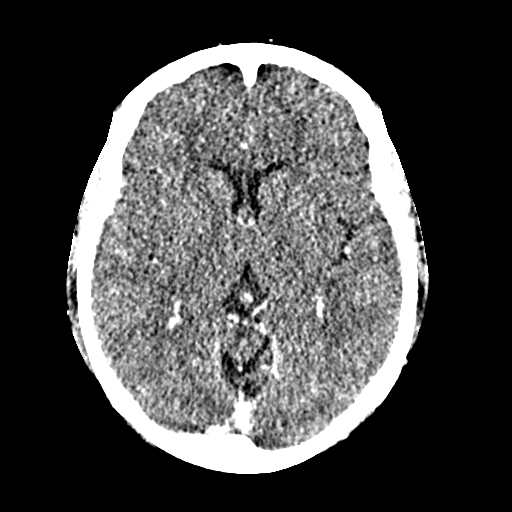
[im 40/79  bone]
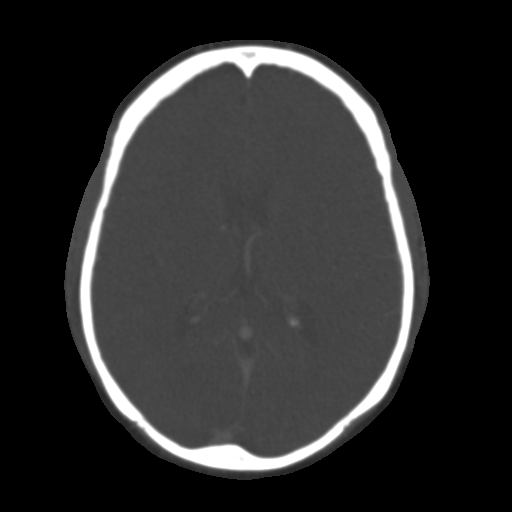
[im 44/79  brain]
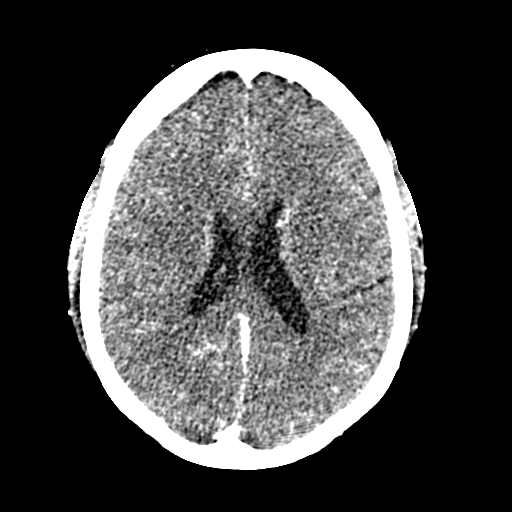
[im 49/79  bone]
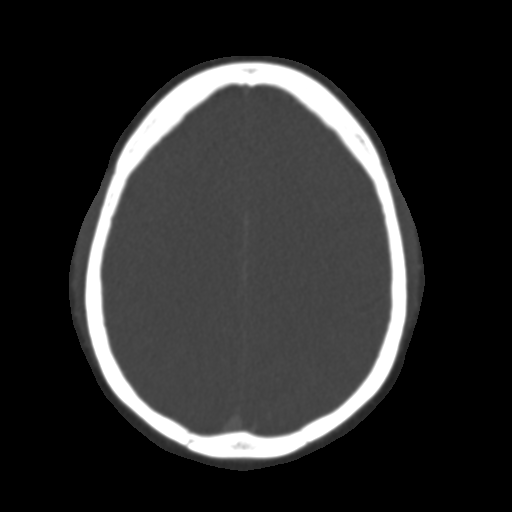
[im 54/79  brain]
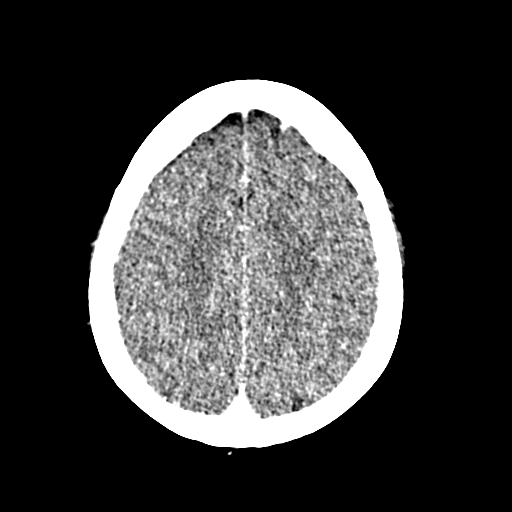
[im 59/79  bone]
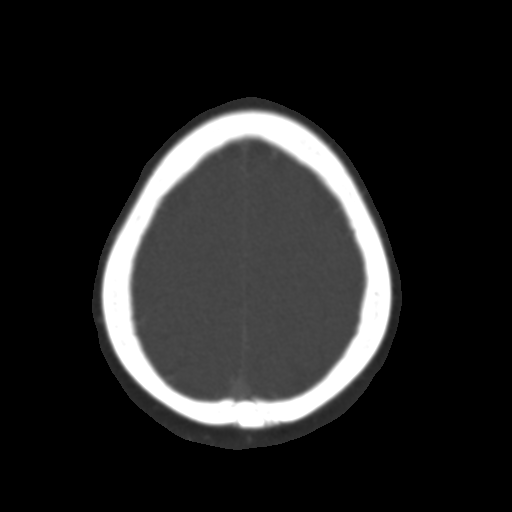
[im 64/79  brain]
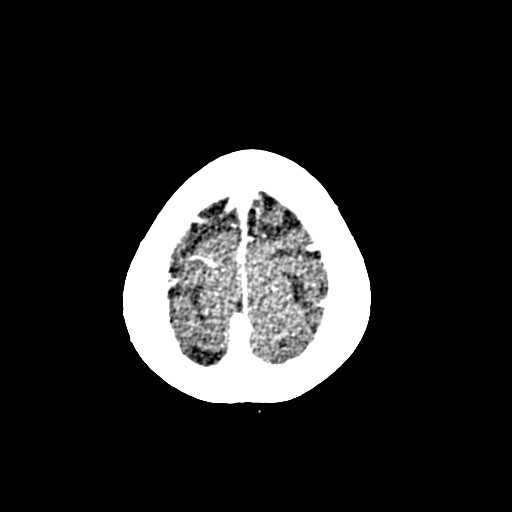
[im 69/79  bone]
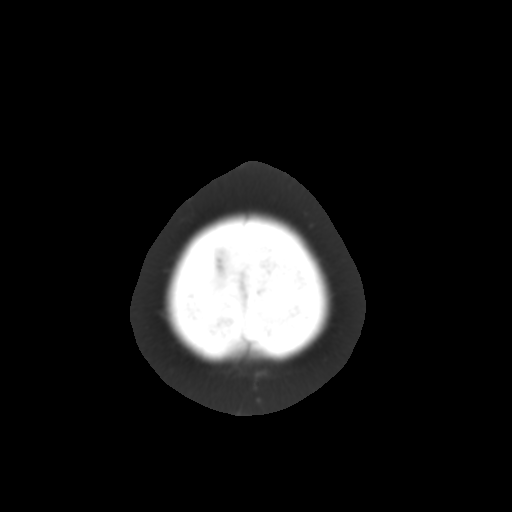
[im 74/79  brain]
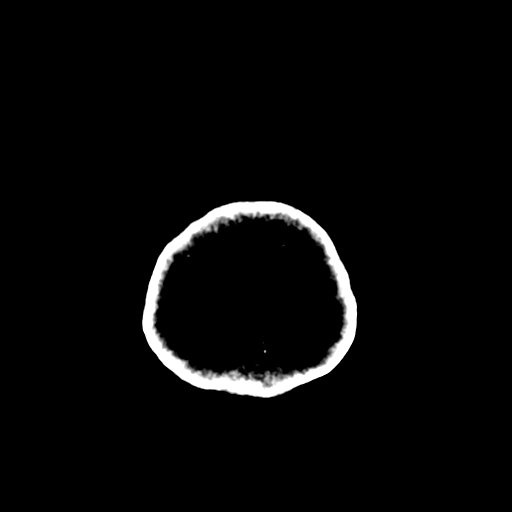

[18 of 47 positions shown; findings below may reference images not displayed]

RADIATION DOSE REDUCTION: This exam was performed according to the
departmental dose-optimization program which includes automated
exposure control, adjustment of the mA and/or kV according to
patient size and/or use of iterative reconstruction technique.

CONTRAST:  75mL OMNIPAQUE IOHEXOL 350 MG/ML SOLN
FINDINGS: CT head: Ventricle size normal. Negative for hemorrhage, mass, or
infarct. Negative for hyperdense vessel.

Negative calvarium. Mild mucosal edema base of right maxillary
sinus. Remaining sinuses clear negative orbit

CT venogram: Superior sagittal sinus contains a small filling
defects posteriorly unchanged and most likely arachnoid granulation.
Small filling defect at the torcular is improved compatible with
resolving thrombus. Right transverse sinus dominant and patent.

Non dominant left transverse sinus which is hypoplastic proximally.
Small filling defect at the transverse/sigmoid sinus junction is
improved from the prior study. Small left sigmoid sinus and jugular
vein patent.
IMPRESSION: 1. No acute intracranial abnormality
2. Improving small volume thrombus in the torcula and left
transverse sinus. No new area of thrombus. Probable arachnoid
granulation in the posterosuperior sagittal sinus, unchanged.

## 2022-01-15 MED ORDER — ONDANSETRON HCL 4 MG/2ML IJ SOLN
4.0000 mg | Freq: Once | INTRAMUSCULAR | Status: AC
  Administered 2022-01-15: 4 mg via INTRAVENOUS

## 2022-01-15 MED ORDER — IOHEXOL 350 MG/ML SOLN
75.0000 mL | Freq: Once | INTRAVENOUS | Status: AC | PRN
  Administered 2022-01-15: 75 mL via INTRAVENOUS

## 2022-01-15 MED ORDER — SODIUM CHLORIDE (PF) 0.9 % IJ SOLN
INTRAMUSCULAR | Status: AC
  Filled 2022-01-15: qty 50

## 2022-01-15 MED ORDER — ONDANSETRON HCL 4 MG/2ML IJ SOLN
INTRAMUSCULAR | Status: AC
  Filled 2022-01-15: qty 2

## 2022-01-20 ENCOUNTER — Other Ambulatory Visit: Payer: Self-pay | Admitting: Adult Health

## 2022-01-20 NOTE — Progress Notes (Signed)
Error- please disregard

## 2022-01-22 ENCOUNTER — Telehealth: Payer: Self-pay | Admitting: *Deleted

## 2022-01-22 NOTE — Telephone Encounter (Signed)
-----   Message from Rachel Moulds, MD sent at 01/22/2022  8:22 AM EST ----- Jasmine [name],  Her MRV looks better with improving blood clot. Like we discussed, she can discontinue anticoagulation and consider baby aspirin. Risk of recurrence is around 8%. If she has any new symptoms concerning for re-occurence of clot such as symptoms concerning for stroke or severe headaches, please ask her to go to the nearest ED.

## 2022-01-22 NOTE — Telephone Encounter (Signed)
Per Dr.Iruku, called and left VM on pt personal cell with message below. Advised to call office with any other concerns.

## 2022-02-03 ENCOUNTER — Inpatient Hospital Stay

## 2022-02-03 ENCOUNTER — Encounter: Payer: Self-pay | Admitting: Genetic Counselor

## 2022-02-03 ENCOUNTER — Inpatient Hospital Stay: Attending: Hematology and Oncology | Admitting: Genetic Counselor

## 2022-02-03 ENCOUNTER — Other Ambulatory Visit: Payer: Self-pay

## 2022-02-03 DIAGNOSIS — Z8049 Family history of malignant neoplasm of other genital organs: Secondary | ICD-10-CM | POA: Diagnosis not present

## 2022-02-03 DIAGNOSIS — Z8042 Family history of malignant neoplasm of prostate: Secondary | ICD-10-CM

## 2022-02-03 HISTORY — DX: Family history of malignant neoplasm of prostate: Z80.42

## 2022-02-03 HISTORY — DX: Family history of malignant neoplasm of other genital organs: Z80.49

## 2022-02-03 LAB — GENETIC SCREENING ORDER

## 2022-02-03 NOTE — Progress Notes (Signed)
REFERRING PROVIDER: Benay Pike, MD [place],  [place] [address]  PRIMARY PROVIDER:  [name], MD  PRIMARY REASON FOR VISIT:  1. Family history of uterine cancer   2. Family history of prostate cancer     HISTORY OF PRESENT ILLNESS:   Jasmine [name], a 46 y.o. female, was seen for a [facility] cancer genetics consultation at the request of Dr. Chryl Heck due to a family history of cancer.  Jasmine [name] presents to clinic today to discuss the possibility of a hereditary predisposition to cancer, to discuss genetic testing, and to further clarify her future cancer risks, as well as potential cancer risks for family members.   Jasmine [name] is a 46 y.o. female with no personal history of cancer.    CANCER HISTORY:  Oncology History   No history exists.    RISK FACTORS:  Menarche was at age 43.  Nulliparous.  OCP use for approximately 8 years.  Ovaries intact: yes; fallopian tubes removed due to history of ectopic pregnancy  Hysterectomy: no.  HRT use: 0 years. Colonoscopy: yes;  most recent in 2013; normal per patient . Mammogram within the last year: yes. Number of breast biopsies:  right axillary lymph node biopsy 06/2021 . Up to date with pelvic exams: yes.   Past Medical History:  Diagnosis Date   Anxiety    Cerebral venous thrombosis    Depression    Ectopic pregnancy    Family history of prostate cancer 02/03/2022   Family history of uterine cancer 02/03/2022   Migraine    Osteoarthritis    Plantar fasciitis    PTSD (post-traumatic stress disorder)     Past Surgical History:  Procedure Laterality Date   ECTOPIC PREGNANCY SURGERY  10/2000   ECTOPIC PREGNANCY SURGERY  01/2004      FAMILY HISTORY:  We obtained a detailed, 4-generation family history.  Significant diagnoses are listed below: Family History  Problem Relation Age of Onset   Uterine cancer Mother        dx 22s   Prostate cancer Maternal Uncle        d. early 18s; mets   Cancer Paternal  Aunt        unknown type; d. 63s   Brain cancer Paternal Uncle        d. <60   Cancer Paternal Uncle        unknown type; dx unknown age   Lung cancer Maternal Grandmother        dx 84s; smoking hx   Prostate cancer Maternal Grandfather        d. mid-late 60s; mets   Cancer Paternal Grandmother        unknown type; mets; d. late 64s-early 70s     Jasmine [name] is unaware of previous family history of genetic testing for hereditary cancer risks.  Affected relatives are unavailable for genetic testing at this point in time. There is no reported Ashkenazi Jewish ancestry. There is no known consanguinity.  GENETIC COUNSELING ASSESSMENT: Jasmine [name] is a 46 y.o. female with a family history of cancer which is somewhat suggestive of a hereditary cancer syndrome and predisposition to cancer given her mother's uterine cancer diagnosis before age 39 and the presence of metastatic prostate cancer in two maternal relatives. We, therefore, discussed and recommended the following at today's visit.   DISCUSSION: We discussed that 5 - 10% of cancer is hereditary, with most cases of hereditary uterine cancer associated with mutations in the  Lynch syndrome genes.  There are other genes that can be associated with hereditary uterine cancer and prostate cancer syndromes.  We discussed that testing is beneficial for several reasons, including knowing about other cancer risks, identifying potential screening and risk-reduction options that may be appropriate, and to understanding if other family members could be at risk for cancer and allowing them to undergo genetic testing.  We reviewed the characteristics, features and inheritance patterns of hereditary cancer syndromes. We also discussed genetic testing, including the appropriate family members to test, the process of testing, insurance coverage and turn-around-time for results. We discussed the implications of a negative, positive, carrier and/or variant of  uncertain significant result. We discussed that negative results would be uninformative given that Jasmine [name] does not have a personal history of cancer. We recommended Jasmine [name] pursue genetic testing for a panel that contains genes associated with uterine and prostate cancers.  Jasmine [name] was offered a common hereditary cancer panel (47 genes) and an expanded pan-cancer panel (77 genes). Jasmine [name] was informed of the benefits and limitations of each panel, including that expanded pan-cancer panels contain several genes that do not have clear management guidelines at this point in time.  We also discussed that as the number of genes included on a panel increases, the chances of variants of uncertain significance increases.  After considering the benefits and limitations of each gene panel, Jasmine [name] elected to have an expanded pan-cancer panel through [facility] [employer].  The CancerNext-Expanded gene panel offered by [facility] and includes sequencing, rearrangement, and RNA analysis for the following 77 genes: AIP, ALK, APC, ATM, AXIN2, BAP1, BARD1, BLM, BMPR1A, BRCA1, BRCA2, BRIP1, CDC73, CDH1, CDK4, CDKN1B, CDKN2A, CHEK2, CTNNA1, DICER1, FANCC, FH, FLCN, GALNT12, KIF1B, LZTR1, MAX, MEN1, MET, MLH1, MSH2, MSH3, MSH6, MUTYH, NBN, NF1, NF2, NTHL1, PALB2, PHOX2B, PMS2, POT1, PRKAR1A, PTCH1, PTEN, RAD51C, RAD51D, RB1, RECQL, RET, SDHA, SDHAF2, SDHB, SDHC, SDHD, SMAD4, SMARCA4, SMARCB1, SMARCE1, STK11, SUFU, TMEM127, TP53, TSC1, TSC2, VHL and XRCC2 (sequencing and deletion/duplication); EGFR, EGLN1, HOXB13, KIT, MITF, PDGFRA, POLD1, and POLE (sequencing only); EPCAM and GREM1 (deletion/duplication only).    Based on Jasmine [name] family history of uterine and prostate  cancer, she meets medical criteria for genetic testing. Despite that she meets criteria, she may still have an out of pocket cost. We discussed that if her out of pocket cost for testing is over $100, the laboratory should contact her to discuss  self-pay options and/or patient pay assistance programs.   We discussed that some people do not want to undergo genetic testing due to fear of genetic discrimination.  A federal law called the Genetic Information Non-Discrimination Act (GINA) of 2008 helps protect individuals against genetic discrimination based on their genetic test results.  It impacts both health insurance and employment.  With health insurance, it protects against increased premiums, being kicked off insurance or being forced to take a test in order to be insured.  For employment it protects against hiring, firing and promoting decisions based on genetic test results.  GINA does not apply to those in the [employer], those who work for companies with less than 15 employees, and new life insurance or long-term disability insurance policies.  Health status due to a cancer diagnosis is not protected under GINA.  PLAN: After considering the risks, benefits, and limitations, Jasmine [name] provided informed consent to pursue genetic testing and the blood sample was sent to [facility] for analysis of the CancerNext-Expanded +RNAinsight Panel. Results should be available  within approximately 3 weeks' time, at which point they will be disclosed by telephone to Jasmine [name], as will any additional recommendations warranted by these results. Jasmine [name] will receive a summary of her genetic counseling visit and a copy of her results once available. This information will also be available in Epic.   Lastly, we encouraged Jasmine [name] to remain in contact with cancer genetics annually so that we can continuously update the family history and inform her of any changes in cancer genetics and testing that may be of benefit for this family.   Jasmine [name] questions were answered to her satisfaction today. Our contact information was provided should additional questions or concerns arise. Thank you for the referral and allowing [place] to share in the care of  your patient.   Deante Blough M. Joette Catching, [place], [facility] Genetic Counselor [email or web address] (P) [phone]   The patient was seen for a total of 40 minutes in face-to-face genetic counseling.   The patient was seen alone.  Drs. Lindi Adie and/or Burr Medico were available to discuss this case as needed.  _______________________________________________________________________ For Office Staff:  Number of people involved in session: 1 Was an Intern/ student involved with case: no

## 2022-02-13 ENCOUNTER — Telehealth: Payer: Self-pay | Admitting: Genetic Counselor

## 2022-02-13 ENCOUNTER — Ambulatory Visit: Payer: Self-pay | Admitting: Genetic Counselor

## 2022-02-13 ENCOUNTER — Encounter: Payer: Self-pay | Admitting: Genetic Counselor

## 2022-02-13 DIAGNOSIS — Z8049 Family history of malignant neoplasm of other genital organs: Secondary | ICD-10-CM

## 2022-02-13 DIAGNOSIS — Z1379 Encounter for other screening for genetic and chromosomal anomalies: Secondary | ICD-10-CM | POA: Insufficient documentation

## 2022-02-13 DIAGNOSIS — Z8042 Family history of malignant neoplasm of prostate: Secondary | ICD-10-CM

## 2022-02-13 NOTE — Telephone Encounter (Signed)
Revealed negative genetic testing.  Discussed that we do not know why there is cancer in the family. It could be sporadic/famillial, due to a change in a gene that she did not inherit, due to a different gene that we are not testing, or maybe our current technology may not be able to pick something up.  It will be important for her to keep in contact with genetics to keep up with whether additional testing may be needed.   ? ? ?

## 2022-02-13 NOTE — Progress Notes (Signed)
HPI:   ?Ms. Mudgett was previously seen in the [facility] clinic due to a family history of cancer and concerns regarding a hereditary predisposition to cancer. Please refer to our prior cancer genetics clinic note for more information regarding our discussion, assessment and recommendations, at the time. Ms. Nunley recent genetic test results were disclosed to her, as were recommendations warranted by these results. These results and recommendations are discussed in more detail below. ? ?CANCER HISTORY:  ?Oncology History  ? No history exists.  ? ? ?FAMILY HISTORY:  ?We obtained a detailed, 4-generation family history.  Significant diagnoses are listed below: ?Family History  ?Problem Relation Age of Onset  ? Hypertension Mother   ? Seizures Mother   ? Uterine cancer Mother   ?     dx 57s  ? Hypertension Sister   ? Prostate cancer Maternal Uncle   ?     d. early 31s; mets  ? Cancer Paternal Aunt   ?     unknown type; d. 9s  ? Brain cancer Paternal Uncle   ?     d. <60  ? Cancer Paternal Uncle   ?     unknown type; dx unknown age  ? Lung cancer Maternal Grandmother   ?     dx 2s; smoking hx  ? Prostate cancer Maternal Grandfather   ?     d. mid-late 95s; mets  ? Cancer Paternal Grandmother   ?     unknown type; mets; d. late 60s-early 70s  ? Diabetes Paternal Grandfather   ? ? ?FAMILY HISTORY:  ?We obtained a detailed, 4-generation family history.  Significant diagnoses are listed below: ?     ?Family History  ?Problem Relation Age of Onset  ? Uterine cancer Mother    ?      dx 48s  ? Prostate cancer Maternal Uncle    ?      d. early 58s; mets  ? Cancer Paternal Aunt    ?      unknown type; d. 33s  ? Brain cancer Paternal Uncle    ?      d. <60  ? Cancer Paternal Uncle    ?      unknown type; dx unknown age  ? Lung cancer Maternal Grandmother    ?      dx 33s; smoking hx  ? Prostate cancer Maternal Grandfather    ?      d. mid-late 43s; mets  ? Cancer Paternal Grandmother    ?      unknown type;  mets; d. late 60s-early 70s  ?  ?  ?Ms. Jambor is unaware of previous family history of genetic testing for hereditary cancer risks.  Affected relatives are unavailable for genetic testing at this point in time. There is no reported Ashkenazi Jewish ancestry. There is no known consanguinity. ?  ?GENETIC TEST RESULTS:  ?The [facility] CancerNext-Expanded +RNAinsight Panel found no pathogenic mutations. The CancerNext-Expanded gene panel offered by [facility] and includes sequencing, rearrangement, and RNA analysis for the following 77 genes: AIP, ALK, APC, ATM, AXIN2, BAP1, BARD1, BLM, BMPR1A, BRCA1, BRCA2, BRIP1, CDC73, CDH1, CDK4, CDKN1B, CDKN2A, CHEK2, CTNNA1, DICER1, FANCC, FH, FLCN, GALNT12, KIF1B, LZTR1, MAX, MEN1, MET, MLH1, MSH2, MSH3, MSH6, MUTYH, NBN, NF1, NF2, NTHL1, PALB2, PHOX2B, PMS2, POT1, PRKAR1A, PTCH1, PTEN, RAD51C, RAD51D, RB1, RECQL, RET, SDHA, SDHAF2, SDHB, SDHC, SDHD, SMAD4, SMARCA4, SMARCB1, SMARCE1, STK11, SUFU, TMEM127, TP53, TSC1, TSC2, VHL and XRCC2 (sequencing and  deletion/duplication); EGFR, EGLN1, HOXB13, KIT, MITF, PDGFRA, POLD1, and POLE (sequencing only); EPCAM and GREM1 (deletion/duplication only).  .  ? ?The test report has been scanned into EPIC and is located under the Molecular Pathology section of the Results Review tab.  A portion of the result report is included below for reference. Genetic testing reported out on February 12, 2022.  ? ? ? ? ?Even though a pathogenic variant was not identified, possible explanations for the cancer in the family may include: ?There may be no hereditary risk for cancer in the family. The cancers in Ms. Penna's family may be sporadic/familial or due to other genetic and environmental factors. ?There may be a gene mutation in one of these genes that current testing methods cannot detect but that chance is small. ?There could be another gene that has not yet been discovered, or that we have not yet tested, that is responsible for the cancer diagnoses  in the family.  ?It is also possible there is a hereditary cause for the cancer in the family that Ms. Curbow did not inherit. ? ? ?Therefore, it is important to remain in touch with cancer genetics in the future so that we can continue to offer Ms. Perezperez the most up to date genetic testing.  ? ?ADDITIONAL GENETIC TESTING:  ?We discussed with Ms. Kluge that her genetic testing was fairly extensive.  If there are genes identified to increase cancer risk that can be analyzed in the future, we would be happy to discuss and coordinate this testing at that time.   ? ?CANCER SCREENING RECOMMENDATIONS:  ?Ms. Brumett's test result is considered negative (normal).  This means that we have not identified a hereditary cause for her family history of cancer at this time.  ? ?An individual's cancer risk and medical management are not determined by genetic test results alone. Overall cancer risk assessment incorporates additional factors, including personal medical history, family history, and any available genetic information that may result in a personalized plan for cancer prevention and surveillance. Therefore, it is recommended she continue to follow the cancer management and screening guidelines provided by her primary healthcare provider. ? ?RECOMMENDATIONS FOR FAMILY MEMBERS:  . ?Individuals in this family might be at some increased risk of developing cancer, over the general population risk, due to the family history of cancer.  Individuals in the family should notify their providers of the family history of cancer to determine appropriate screening.  ?Other members of the family may still carry a pathogenic variant in one of these genes that Ms. Wigington did not inherit. Based on the family history, we recommend her mother, who was diagnosed with uterine cancer in her 34s, have genetic counseling and testing. Ms. Demarais will let us know if we can be of any assistance in coordinating genetic counseling and/or testing for  this family member.   ? ? ?FOLLOW-UP:  ?Lastly, we discussed with Ms. Geil that cancer genetics is a rapidly advancing field and it is possible that new genetic tests will be appropriate for her and/or her family members in the future. We encouraged her to remain in contact with cancer genetics on an annual basis so we can update her personal and family histories and let her know of advances in cancer genetics that may benefit this family.  ? ?Our contact number was provided. Ms. Perlman questions were answered to her satisfaction, and she knows she is welcome to call us at anytime with additional questions or concerns.  ? ?Dejion Grillo  Aryka Mask, MS, LCGC ?Genetic Counselor ?[email or web address] ?(P) [phone] ? ?

## 2022-03-17 ENCOUNTER — Encounter: Payer: Self-pay | Admitting: Adult Health

## 2022-03-17 ENCOUNTER — Ambulatory Visit: Admitting: Adult Health

## 2022-03-17 VITALS — BP 132/87 | HR 84 | Wt 193.0 lb

## 2022-03-17 DIAGNOSIS — G08 Intracranial and intraspinal phlebitis and thrombophlebitis: Secondary | ICD-10-CM | POA: Diagnosis not present

## 2022-03-17 NOTE — Patient Instructions (Signed)
Continue aspirin 81 mg daily  ? ?Continue to follow with oncology with plans on repeat hypercoagulable labs next month ? ? ? ? ? ? ? ?Thank you for coming to see [place] at [facility]. I hope we have been able to provide you high quality care today. ? ?You may receive a patient satisfaction survey over the next few weeks. We would appreciate your feedback and comments so that we may continue to improve ourselves and the health of our patients. ? ? ?

## 2022-03-17 NOTE — Progress Notes (Signed)
?[facility] ?[id number] ?[place]. [place] [id number] ?[phone] ? ?     STROKE FOLLOW UP NOTE ? ?[name] ?Date of Birth:  01-28-1976 ?Medical Record Number:  [id number]  ? ?Reason for Referral:  stroke follow up ? ? ? ?SUBJECTIVE: ? ? ?CHIEF COMPLAINT:  ?Chief Complaint  ?Patient presents with  ? Follow-up  ?  Rm 3 alone ?Pt is well and stable, no concerns   ? ? ? ?HPI:  ? ?Update 03/17/2022 JM: 46 year old female for stroke follow-up after prior visit 8 months ago.  Overall stable without new or reoccurring stroke/TIA symptoms. Denies any headaches or seizures. She is ensuring she is staying well hydrated.  Repeat CTV showed continued improvement of thrombus and per oncology, Eliquis d/c'd and started on aspirin 81 mg daily. Plans on repeat hypercoagulable labs next month.  Blood pressure today 132/87.  No new concerns at this time. ? ? ? ? ?History provided for reference purposes only ?Update 07/15/2021 JM: [name] returns for stroke follow-up.  Stable since prior visit without new or reoccurring stroke/TIA symptoms.  Compliant on Eliquis without associated side effects.  PCP discontinued statin as it was not felt to be indicated - LDL 61 03/2021 on statin and LDL 106 06/2021 off statin - plans to repeat in 4 months per pt - she has been trying to make dietary changes and limiting cholesterol intake.  Blood pressure today 120/86.  Repeat hypercoagulable labs show continued elevated dRRVT otherwise unremarkable -referred to hematology for further input on anticoagulation and duration - seen by hematologist Dr. Al PimpleIruku on 7/18 - felt to be falsely positive due to Eliquis use - [facility] 6 to 12 months and has follow-up visit on 8/17.  Repeat CTV 07/03/2021 showed some resolution of thrombus.  No further concerns at this time. ? ?Initial visit 04/09/2021 JM: [name] is being seen for hospital follow-up unaccompanied ? ?Doing well since discharge without residual  deficits or new/recurrent stroke/TIA symptoms.  Reports compliance on Eliquis 5 mg twice daily and atorvastatin 40 mg daily without associated side effects.  Blood pressure today 138/89.  She has returned back to all prior activities without difficulty ? ?Multiple questions regarding possible clotting disorder and cause of recent event.  She denies any prior known clotting disorder or hx of clots.  Denies use of birth control pills or estrogen.  She does admit to being dehydrated at that time but has since increased fluid intake.  She does report her mother has a history of autoimmune connective tissue disorder (unsure of specific type) and hypothyroidism.  Sister has history of sarcoidosis. ? ?No further concerns at this time ? ?Stroke admission 03/07/2021 ?Jasmine [name] is a 46 y.o. female past medical history of anxiety, depression, PTSD, prior ectopic pregnancies, and migraine in the past with last migrainous headache 3 to 4 years prior to admission, who presented to outside hospital on 03/07/2021 for evaluation of headaches and blurred vision that started sometime 3/20 and did not get better and continued to get worse.  Personally reviewed hospitalization pertinent progress notes, lab work and imaging summary provided.  CT venogram showed dural venous sinus thrombosis and [facility].  MRI consistent with previously identified dural sinus thrombosis and negative for acute infarct.  Hypercoagulable labs positive for lupus anticoagulant and low protein S levels.  Initiated Eliquis and aggressive hydration.  Hematology referral placed for further evaluation and decision on duration of  anticoagulant.  LDL 111 and initiate atorvastatin 40 mg daily.  History of migraine with presenting headache not consistent with known migraines.  Evaluated by therapies without therapy needs and discharged home in stable condition. ? 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  ?HA different  than her normal migraine ?CT head: No acute findings  ?  ?CT venogram: subocclusive thrombus within the inferior aspect of the SSS and confluence of sinuses. Near occlusive thrombus throughout the left transverse and sigmoid sinuses.  ?  ?MRI Brain 03/08/21  ?1. Abnormal flow void within the posterosuperior sagittal sinus, ?extending into the left transverse and sigmoid sinuses, consistent ?with previously identified acute dural sinus thrombosis. No ?associated venous infarct, hemorrhage, or other complication. ?2. Otherwise normal brain MRI. ?3. Normal intracranial MRA. ?4. Normal MRA of the neck. ?  ?2D Echo EF 55-60% ?Hypercoagulabe labs all negative except as below ?  ?DRVVT ?PTT lupus anticoagulant 0.0 - 47.0 sec ?0.0-51.9 sec 57.3 High   ?29.7  ?  ?Protein S activity  63-140%  37 point  ?Protein S Ag, Total 60 - 150 % 96   ?  ?LDL 111 ?HgbA1c 6.0 ?VTE prophylaxis - on heparin gtt. Heparin levels monitored daily; 0.41 today.  ?No anticoagulant/antiplatelet prior to admission, Transition from heparin drip to Eliquis.  ?Therapy recommendations:  No follow up.  ?Dispositio/n:  home ? ? ? ? ?ROS:   ?14 system review of systems performed and negative with exception of no complaints ? ?PMH:  ?Past Medical History:  ?Diagnosis Date  ? Anxiety   ? Cerebral venous thrombosis   ? Depression   ? Ectopic pregnancy   ? Family history of prostate cancer 02/03/2022  ? Family history of uterine cancer 02/03/2022  ? Migraine   ? Osteoarthritis   ? Plantar fasciitis   ? PTSD (post-traumatic stress disorder)   ? ? ?PSH:  ?Past Surgical History:  ?Procedure Laterality Date  ? ECTOPIC PREGNANCY SURGERY  10/2000  ? ECTOPIC PREGNANCY SURGERY  01/2004  ? ? ?Social History:  ?Social History  ? ?Socioeconomic History  ? Marital status: Divorced  ?  Spouse name: Not on file  ? Number of children: Not on file  ? Years of education: Not on file  ? Highest education level: Not on file  ?Occupational History  ? Not on file  ?Tobacco Use  ?  Smoking status: Never  ? Smokeless tobacco: Never  ?Vaping Use  ? Vaping Use: Never used  ?Substance and Sexual Activity  ? Alcohol use: No  ? Drug use: No  ? Sexual activity: Not Currently  ?Other Topics Concern  ? Not on file  ?Social History Narrative  ? Not on file  ? ?Social Determinants of Health  ? ?Financial Resource Strain: Not on file  ?Food Insecurity: Not on file  ?Transportation Needs: Not on file  ?Physical Activity: Not on file  ?Stress: Not on file  ?Social Connections: Not on file  ?Intimate Partner Violence: Not on file  ? ? ?Family History:  ?Family History  ?Problem Relation Age of Onset  ? Hypertension Mother   ? Seizures Mother   ? Uterine cancer Mother   ?     dx 30s  ? Hypertension Sister   ? Prostate cancer Maternal Uncle   ?     d. early 55s; mets  ? Cancer Paternal Aunt   ?     unknown type; d. 87s  ? Brain cancer Paternal Uncle   ?  d. <60  ? Cancer Paternal Uncle   ?     unknown type; dx unknown age  ? Lung cancer Maternal Grandmother   ?     dx 46s; smoking hx  ? Prostate cancer Maternal Grandfather   ?     d. mid-late 17s; mets  ? Cancer Paternal Grandmother   ?     unknown type; mets; d. late 60s-early 70s  ? Diabetes Paternal Grandfather   ? ? ?Medications:   ?Current Outpatient Medications on File Prior to Visit  ?Medication Sig Dispense Refill  ? acetaminophen (TYLENOL) 325 MG tablet Take 2 tablets (650 mg total) by mouth every 6 (six) hours as needed for fever or mild pain.    ? escitalopram (LEXAPRO) 10 MG tablet Take 10 mg by mouth at bedtime.    ? levonorgestrel (MIRENA) 20 MCG/DAY IUD 1 each by Intrauterine route once.    ? ?No current facility-administered medications on file prior to visit.  ? ? ?Allergies:  No Known Allergies ? ? ? ?OBJECTIVE: ? ?Physical Exam ? ?Vitals:  ? 03/17/22 0906  ?BP: 132/87  ?Pulse: 84  ?Weight: 193 lb (87.5 kg)  ? ?Body mass index is 36.47 kg/m?Marland Kitchen ?No results found. ? ? ?General: well developed, well nourished, very pleasant middle-aged  African-American female, seated, in no evident distress ?Head: head normocephalic and atraumatic.   ?Neck: supple with no carotid or supraclavicular bruits ?Cardiovascular: regular rate and rhythm, no murmurs ?Mus

## 2022-03-31 ENCOUNTER — Other Ambulatory Visit: Payer: Self-pay

## 2022-03-31 ENCOUNTER — Inpatient Hospital Stay: Attending: Hematology and Oncology

## 2022-03-31 DIAGNOSIS — Z7901 Long term (current) use of anticoagulants: Secondary | ICD-10-CM | POA: Diagnosis not present

## 2022-03-31 DIAGNOSIS — G08 Intracranial and intraspinal phlebitis and thrombophlebitis: Secondary | ICD-10-CM

## 2022-03-31 DIAGNOSIS — Z86718 Personal history of other venous thrombosis and embolism: Secondary | ICD-10-CM | POA: Diagnosis present

## 2022-04-02 LAB — LUPUS ANTICOAGULANT PANEL
DRVVT: 43.1 s (ref 0.0–47.0)
PTT Lupus Anticoagulant: 30.9 s (ref 0.0–43.5)

## 2022-04-14 ENCOUNTER — Encounter: Payer: Self-pay | Admitting: Family Medicine

## 2022-04-14 ENCOUNTER — Ambulatory Visit: Admitting: Family Medicine

## 2022-04-14 VITALS — BP 130/84 | HR 80 | Temp 96.9°F | Ht 62.0 in | Wt 192.5 lb

## 2022-04-14 DIAGNOSIS — F3342 Major depressive disorder, recurrent, in full remission: Secondary | ICD-10-CM

## 2022-04-14 DIAGNOSIS — F419 Anxiety disorder, unspecified: Secondary | ICD-10-CM | POA: Diagnosis not present

## 2022-04-14 DIAGNOSIS — G08 Intracranial and intraspinal phlebitis and thrombophlebitis: Secondary | ICD-10-CM | POA: Diagnosis not present

## 2022-04-14 DIAGNOSIS — F4310 Post-traumatic stress disorder, unspecified: Secondary | ICD-10-CM | POA: Diagnosis not present

## 2022-04-14 NOTE — Progress Notes (Signed)
?[facility] ?[facility] ?[place] [place] ?[place] [address] ?Dept: [phone] ?Dept Fax: [phone] ? ?Chronic Care Office Visit ? ?Subjective:  ? ? Patient ID: Jasmine [name], female    DOB: [birth year], 46 y.o..   MRN: [id number] ? ?Chief Complaint  ?Patient presents with  ? Follow-up  ?  3 month f/u  ? ? ?History of Present Illness: ? ?Patient is in today for reassessment of chronic medical issues. ? ?Jasmine [name] has a history of an acute dural sinus thrombosis in March 2022. She has been seen by both Ms. McCue (neurology) and Dr. Chryl Heck (hematology). She is now off of Eliquis and is taking a daily aspirin.  Dr. Lemont Fillers does plan to do some repeat blood testing now that she is off of Eliquis to complete her hypercoagulability evaluation. ?   ?Jasmine [name] has a past history of PTSD, anxiety, and major depressive disorder. She is currently managed on a low dose of Lexapro and finds these symptoms are doing well. The [employer] did reduce her disability rating and subsequently her disability pay. She now is facing needing to try and go back to work. She has had some concern about re-entering the job market ? ?Past Medical History: ?Patient Active Problem List  ? Diagnosis Date Noted  ? Genetic testing 02/13/2022  ? Family history of uterine cancer 02/03/2022  ? Family history of prostate cancer 02/03/2022  ? Post traumatic stress disorder (PTSD) 01/13/2022  ? IUD (intrauterine device) in place 01/13/2022  ? Axillary lymphadenopathy 06/27/2021  ? Prediabetes 04/10/2021  ? Obesity (BMI 35.0-39.9 without comorbidity) 04/10/2021  ? Anxiety 03/08/2021  ? Dural venous sinus thrombosis 03/07/2021  ? Recurrent major depression in full remission ([place]) 03/05/2016  ? ?Past Surgical History:  ?Procedure Laterality Date  ? ECTOPIC PREGNANCY SURGERY  10/2000  ? ECTOPIC PREGNANCY SURGERY  01/2004  ? ?Family History  ?Problem Relation Age of Onset  ? Hypertension Mother   ? Seizures Mother   ?  Uterine cancer Mother   ?     dx 52s  ? Hypertension Sister   ? Prostate cancer Maternal Uncle   ?     d. early 47s; mets  ? Cancer Paternal Aunt   ?     unknown type; d. 15s  ? Brain cancer Paternal Uncle   ?     d. <60  ? Cancer Paternal Uncle   ?     unknown type; dx unknown age  ? Lung cancer Maternal Grandmother   ?     dx 76s; smoking hx  ? Prostate cancer Maternal Grandfather   ?     d. mid-late 7s; mets  ? Cancer Paternal Grandmother   ?     unknown type; mets; d. late 60s-early 70s  ? Diabetes Paternal Grandfather   ? ?Outpatient Medications Prior to Visit  ?Medication Sig Dispense Refill  ? acetaminophen (TYLENOL) 325 MG tablet Take 2 tablets (650 mg total) by mouth every 6 (six) hours as needed for fever or mild pain.    ? aspirin EC 81 MG tablet Take 81 mg by mouth daily. Swallow whole.    ? escitalopram (LEXAPRO) 10 MG tablet Take 10 mg by mouth at bedtime.    ? levonorgestrel (MIRENA) 20 MCG/DAY IUD 1 each by Intrauterine route once.    ? ?No facility-administered medications prior to visit.  ? ?No Known Allergies ?   ?Objective:  ? ?Today's Vitals  ? 04/14/22 0841  ?BP:  130/84  ?Pulse: 80  ?Temp: (!) 96.9 ?F (36.1 ?C)  ?TempSrc: Temporal  ?SpO2: 99%  ?Weight: 192 lb 8 oz (87.3 kg)  ?Height: 5\' 2"  (1.575 m)  ? ?Body mass index is 35.21 kg/m?.  ? ?General: Well developed, well nourished. No acute distress. ?Psych: Alert and oriented. Normal mood and affect. ? ?Health Maintenance Due  ?Topic Date Due  ? Hepatitis C Screening  Never done  ? COVID-19 Vaccine (4 - Booster for Moderna series) 11/28/2020  ? ?Imaging: ?CT Venogram Head (01/15/2022) ?IMPRESSION: ?1. No acute intracranial abnormality ?2. Improving small volume thrombus in the torcula and left transverse sinus. No new area of thrombus. Probable arachnoid granulation in the posterosuperior sagittal sinus, unchanged. ? ? ?  04/14/2022  ?  9:00 AM 01/13/2022  ? 11:11 AM 10/11/2021  ?  9:14 AM 04/10/2021  ?  8:51 AM 04/09/2021  ? 10:29 AM  ?Depression  screen PHQ 2/9  ?Decreased Interest 0 0 0 0 0  ?Down, Depressed, Hopeless 0 0 0 0 0  ?PHQ - 2 Score 0 0 0 0 0  ?Altered sleeping 0 0 0    ?Tired, decreased energy 0 1 1    ?Change in appetite 0 0 1    ?Feeling bad or failure about yourself  0 0 0    ?Trouble concentrating 0 0 0    ?Moving slowly or fidgety/restless 1 0 0    ?Suicidal thoughts 0 0 0    ?PHQ-9 Score 1 1 2     ?Difficult doing work/chores Not difficult at all Not difficult at all Not difficult at all    ? ? ?  04/14/2022  ?  9:01 AM 01/13/2022  ? 11:11 AM 10/11/2021  ?  9:20 AM  ?GAD 7 : Generalized Anxiety Score  ?Nervous, Anxious, on Edge 1 1 1   ?Control/stop worrying 1 0 0  ?Worry too much - different things 1 0 0  ?Trouble relaxing 0 0 0  ?Restless 1 0 0  ?Easily annoyed or irritable 1 1 1   ?Afraid - awful might happen 0 0 0  ?Total GAD 7 Score 5 2 2   ?Anxiety Difficulty Not difficult at all Not difficult at all Not difficult at all  ? ?Assessment & Plan:  ? ?1. Recurrent major depression in full remission ([place]) ?2. Anxiety ?3. Post traumatic stress disorder (PTSD) ?Jasmine [name] is well managed with her Lexapro 20 mg daily and the coping skills she has developed for management of mood disorders. ? ?4. Dural venous sinus thrombosis ?Continue aspirin 81 mg daily. She will follow through with Dr. Lemont Fillers regarding repeat testing. ? ?Return in about 6 months (around 10/15/2022) for Reassessment.  ? ?[name], MD ?

## 2022-04-29 ENCOUNTER — Telehealth: Payer: Self-pay | Admitting: Hematology and Oncology

## 2022-04-29 NOTE — Telephone Encounter (Signed)
Rescheduled appointment per providers template. Left message.  ? ?

## 2022-05-07 ENCOUNTER — Telehealth: Payer: Self-pay | Admitting: Hematology and Oncology

## 2022-05-07 NOTE — Telephone Encounter (Signed)
Rescheduled appointment per providers template. Left message.  ? ?

## 2022-06-06 ENCOUNTER — Other Ambulatory Visit: Payer: Self-pay | Admitting: *Deleted

## 2022-06-06 DIAGNOSIS — Z30432 Encounter for removal of intrauterine contraceptive device: Secondary | ICD-10-CM

## 2022-06-12 LAB — HM MAMMOGRAPHY

## 2022-06-13 ENCOUNTER — Encounter: Payer: Self-pay | Admitting: Family Medicine

## 2022-06-20 NOTE — Progress Notes (Unsigned)
GYNECOLOGY  VISIT   HPI: 46 y.o.   Divorced  [place]  female   [phone] with No LMP recorded.   here for IUD removal.    GYNECOLOGIC HISTORY: No LMP recorded. Contraception:  Mirena 09-06-21/Bil.salpingectomy Menopausal hormone therapy:  n/a Last mammogram:  ***06-06-21 prominent lymph nodes;Rec.bil.axillary U/S. Last pap smear:  07-11-21 Neg:Neg HR HPV        OB History     Gravida  3   Para      Term      Preterm      AB  3   Living         SAB      IAB      Ectopic  3   Multiple      Live Births                 Patient Active Problem List   Diagnosis Date Noted   Genetic testing 02/13/2022   Family history of uterine cancer 02/03/2022   Family history of prostate cancer 02/03/2022   Post traumatic stress disorder (PTSD) 01/13/2022   IUD (intrauterine device) in place 01/13/2022   Axillary lymphadenopathy 06/27/2021   Prediabetes 04/10/2021   Obesity (BMI 35.0-39.9 without comorbidity) 04/10/2021   Anxiety 03/08/2021   Dural venous sinus thrombosis 03/07/2021   Recurrent major depression in full remission ([facility]) 03/05/2016    Past Medical History:  Diagnosis Date   Anxiety    Cerebral venous thrombosis    Depression    Ectopic pregnancy    Family history of prostate cancer 02/03/2022   Family history of uterine cancer 02/03/2022   Migraine    Osteoarthritis    Plantar fasciitis    PTSD (post-traumatic stress disorder)     Past Surgical History:  Procedure Laterality Date   ECTOPIC PREGNANCY SURGERY  10/2000   ECTOPIC PREGNANCY SURGERY  01/2004    Current Outpatient Medications  Medication Sig Dispense Refill   acetaminophen (TYLENOL) 325 MG tablet Take 2 tablets (650 mg total) by mouth every 6 (six) hours as needed for fever or mild pain.     aspirin EC 81 MG tablet Take 81 mg by mouth daily. Swallow whole.     escitalopram (LEXAPRO) 10 MG tablet Take 10 mg by mouth at bedtime.     levonorgestrel (MIRENA) 20 MCG/DAY IUD 1 each by  Intrauterine route once.     No current facility-administered medications for this visit.     ALLERGIES: Patient has no known allergies.  Family History  Problem Relation Age of Onset   Hypertension Mother    Seizures Mother    Uterine cancer Mother        dx 65s   Hypertension Sister    Prostate cancer Maternal Uncle        d. early 52s; mets   Cancer Paternal Aunt        unknown type; d. 46s   Brain cancer Paternal Uncle        d. <60   Cancer Paternal Uncle        unknown type; dx unknown age   Lung cancer Maternal Grandmother        dx 30s; smoking hx   Prostate cancer Maternal Grandfather        d. mid-late 48s; mets   Cancer Paternal Grandmother        unknown type; mets; d. late 60s-early 57s   Diabetes Paternal Grandfather     Social History  Socioeconomic History   Marital status: Divorced    Spouse name: Not on file   Number of children: Not on file   Years of education: Not on file   Highest education level: Not on file  Occupational History   Not on file  Tobacco Use   Smoking status: Never   Smokeless tobacco: Never  Vaping Use   Vaping Use: Never used  Substance and Sexual Activity   Alcohol use: No   Drug use: No   Sexual activity: Not Currently  Other Topics Concern   Not on file  Social History Narrative   Not on file   Social Determinants of Health   Financial Resource Strain: Not on file  Food Insecurity: Not on file  Transportation Needs: Not on file  Physical Activity: Not on file  Stress: Not on file  Social Connections: Not on file  Intimate Partner Violence: Not on file    Review of Systems  PHYSICAL EXAMINATION:    There were no vitals taken for this visit.    General appearance: alert, cooperative and appears stated age Head: Normocephalic, without obvious abnormality, atraumatic Neck: no adenopathy, supple, symmetrical, trachea midline and thyroid normal to inspection and palpation Lungs: clear to auscultation  bilaterally Breasts: normal appearance, no masses or tenderness, No nipple retraction or dimpling, No nipple discharge or bleeding, No axillary or supraclavicular adenopathy Heart: regular rate and rhythm Abdomen: soft, non-tender, no masses,  no organomegaly Extremities: extremities normal, atraumatic, no cyanosis or edema Skin: Skin color, texture, turgor normal. No rashes or lesions Lymph nodes: Cervical, supraclavicular, and axillary nodes normal. No abnormal inguinal nodes palpated Neurologic: Grossly normal  Pelvic: External genitalia:  no lesions              Urethra:  normal appearing urethra with no masses, tenderness or lesions              Bartholins and Skenes: normal                 Vagina: normal appearing vagina with normal color and discharge, no lesions              Cervix: no lesions                Bimanual Exam:  Uterus:  normal size, contour, position, consistency, mobility, non-tender              Adnexa: no mass, fullness, tenderness              Rectal exam: {yes no:[id number]}.  Confirms.              Anus:  normal sphincter tone, no lesions  Chaperone was present for exam:  ***  ASSESSMENT     PLAN     An After Visit Summary was printed and given to the patient.  ______ minutes face to face time of which over 50% was spent in counseling.

## 2022-06-23 ENCOUNTER — Encounter: Payer: Self-pay | Admitting: Obstetrics and Gynecology

## 2022-06-23 ENCOUNTER — Ambulatory Visit (INDEPENDENT_AMBULATORY_CARE_PROVIDER_SITE_OTHER): Admitting: Obstetrics and Gynecology

## 2022-06-23 VITALS — BP 128/78 | Ht 61.0 in | Wt 193.0 lb

## 2022-06-23 DIAGNOSIS — Z30432 Encounter for removal of intrauterine contraceptive device: Secondary | ICD-10-CM

## 2022-06-30 ENCOUNTER — Ambulatory Visit: Admitting: Hematology and Oncology

## 2022-07-01 ENCOUNTER — Ambulatory Visit: Admitting: Hematology and Oncology

## 2022-07-08 ENCOUNTER — Inpatient Hospital Stay: Attending: Hematology and Oncology | Admitting: Hematology and Oncology

## 2022-07-08 ENCOUNTER — Other Ambulatory Visit: Payer: Self-pay

## 2022-07-08 VITALS — BP 130/89 | HR 72 | Temp 97.5°F | Resp 18 | Ht 61.0 in | Wt 194.9 lb

## 2022-07-08 DIAGNOSIS — Z808 Family history of malignant neoplasm of other organs or systems: Secondary | ICD-10-CM | POA: Diagnosis not present

## 2022-07-08 DIAGNOSIS — Z8049 Family history of malignant neoplasm of other genital organs: Secondary | ICD-10-CM | POA: Diagnosis not present

## 2022-07-08 DIAGNOSIS — Z8042 Family history of malignant neoplasm of prostate: Secondary | ICD-10-CM | POA: Diagnosis not present

## 2022-07-08 DIAGNOSIS — Z86718 Personal history of other venous thrombosis and embolism: Secondary | ICD-10-CM | POA: Insufficient documentation

## 2022-07-08 DIAGNOSIS — G08 Intracranial and intraspinal phlebitis and thrombophlebitis: Secondary | ICD-10-CM

## 2022-07-08 DIAGNOSIS — Z801 Family history of malignant neoplasm of trachea, bronchus and lung: Secondary | ICD-10-CM | POA: Diagnosis not present

## 2022-07-08 DIAGNOSIS — Z7901 Long term (current) use of anticoagulants: Secondary | ICD-10-CM | POA: Diagnosis not present

## 2022-07-08 DIAGNOSIS — R519 Headache, unspecified: Secondary | ICD-10-CM | POA: Insufficient documentation

## 2022-07-08 NOTE — Progress Notes (Signed)
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   Her hypercoagulable work-up was unremarkable except for lupus anticoagulant which is likely false positive because of presence of Eliquis.  Repeat lupus anticoagulant while she was off of Eliquis normal. Last CT venogram was back in February 2023 and this showed improving small volume thrombus in the torcula and left transverse sinus.  No new area of thrombus.  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 She continues to feel well, continues to have intermittent headaches We have previously discussed 6 to 9 months of anticoagulation and about 8% risk of recurrence. She stopped anticoagulation in Feb 2023.  Repeat lupus anticoagulant work-up off of anticoagulation showed no evidence of persistent lupus anticoagulant. At this time, there are no additional recommendations from heme-onc standpoint.  We have discussed risk factors for recurrence of clotting and precautions to minimize her risk of future clotting including avoiding any form of estrogen supplementation.  She expressed understanding Age appropriate cancer screening advised. RTC as needed. She can continue to follow-up with her PCP for her headache issues and for any other medical needs.  Thank you for consulting [place] in the care of this patient.  Please do not hesitate to  contact us with any additional questions or concerns.  HISTORY OF PRESENTING ILLNESS:   Jasmine [name] 46 y.o. female is here because of dural venous sinus thrombus.    Interval history  [name] is here for a follow-up.   Since last visit, she continues to do well. She reports intermittent episodes of thunderclap headaches, lasts for a few seconds to minute and they spontaneously resolve No other neurological complaints,  Rest of the pertinent 10 point ROS reviewed and negative   MEDICAL HISTORY:  Past Medical History:  Diagnosis Date   Anxiety    Cerebral venous thrombosis    Depression    Ectopic pregnancy    Family history of prostate cancer 02/03/2022   Family history of uterine cancer 02/03/2022   Migraine    Osteoarthritis    Plantar fasciitis    PTSD (post-traumatic stress disorder)     SURGICAL HISTORY: Past Surgical History:  Procedure Laterality Date   ECTOPIC PREGNANCY SURGERY  10/2000   ECTOPIC PREGNANCY SURGERY  01/2004    SOCIAL HISTORY: Social History   Socioeconomic History   Marital status: Divorced    Spouse name: Not on file   Number of children: Not on file   Years of education: Not on file   Highest education level: Not on file  Occupational History   Not on file  Tobacco Use   Smoking status: Never   Smokeless tobacco: Never  Vaping Use   Vaping Use: Never used  Substance and Sexual Activity   Alcohol use: No   Drug use: No   Sexual activity: Not Currently  Other Topics Concern   Not on file  Social History Narrative   Not on file   Social Determinants of Health  Financial Resource Strain: Not on file  Food Insecurity: Not on file  Transportation Needs: Not on file  Physical Activity: Not on file  Stress: Not on file  Social Connections: Not on file  Intimate Partner Violence: Not on file    FAMILY HISTORY: Family History  Problem Relation Age of Onset   Hypertension Mother    Seizures Mother    Uterine cancer  Mother        dx 30s   Hypertension Sister    Prostate cancer Maternal Uncle        d. early 44s; mets   Cancer Paternal Aunt        unknown type; d. 78s   Brain cancer Paternal Uncle        d. <60   Cancer Paternal Uncle        unknown type; dx unknown age   Lung cancer Maternal Grandmother        dx 65s; smoking hx   Prostate cancer Maternal Grandfather        d. mid-late 69s; mets   Cancer Paternal Grandmother        unknown type; mets; d. late 60s-early 70s   Diabetes Paternal Grandfather     ALLERGIES:  has No Known Allergies.  MEDICATIONS:  Current Outpatient Medications  Medication Sig Dispense Refill   acetaminophen (TYLENOL) 325 MG tablet Take 2 tablets (650 mg total) by mouth every 6 (six) hours as needed for fever or mild pain.     aspirin EC 81 MG tablet Take 81 mg by mouth daily. Swallow whole.     escitalopram (LEXAPRO) 10 MG tablet Take 10 mg by mouth at bedtime.     levonorgestrel (MIRENA) 20 MCG/DAY IUD 1 each by Intrauterine route once.     No current facility-administered medications for this visit.    PHYSICAL EXAMINATION: ECOG PERFORMANCE STATUS: 0 - Asymptomatic  Vitals:   07/08/22 0823  BP: 130/89  Pulse: 72  Resp: 18  Temp: (!) 97.5 F (36.4 C)  SpO2: 100%    Filed Weights   07/08/22 0823  Weight: 194 lb 14.4 oz (88.4 kg)   Physical Exam Constitutional:      Appearance: Normal appearance.  HENT:     Head: Normocephalic and atraumatic.  Cardiovascular:     Rate and Rhythm: Normal rate and regular rhythm.     Pulses: Normal pulses.     Heart sounds: Normal heart sounds.  Pulmonary:     Effort: Pulmonary effort is normal.     Breath sounds: Normal breath sounds.  Musculoskeletal:        General: No swelling or tenderness.  Skin:    General: Skin is warm and dry.  Neurological:     Mental Status: She is alert and oriented to person, place, and time.      LABORATORY DATA:  I have reviewed the data as listed Lab Results   Component Value Date   WBC 4.7 07/11/2021   HGB 12.0 07/11/2021   HCT 36.8 07/11/2021   MCV 90.1 07/11/2021   PLT 334.0 07/11/2021     Chemistry      Component Value Date/Time   NA 139 04/30/2021 1657   K 4.1 04/30/2021 1657   CL 105 04/30/2021 1657   CO2 26 04/30/2021 1657   BUN 12 04/30/2021 1657   CREATININE 1.00 01/15/2022 0834      Component Value Date/Time   CALCIUM 9.3 04/30/2021 1657   ALKPHOS 57 03/04/2016 2018  AST 18 03/04/2016 2018   ALT 16 03/04/2016 2018   BILITOT 0.5 03/04/2016 2018       RADIOGRAPHIC STUDIES: I have personally reviewed the radiological images as listed and agreed with the findings in the report. No results found.  All questions were answered. The patient knows to call the clinic with any problems, questions or concerns.  I spent 30 minutes in the care of this patient including history, review of records, counseling and coordination of care, Please refer to the above-mentioned assessment and plan for detailed discussion from today.  We have discussed about anticoagulation recommendations and cerebral venous sinus thrombosis, risk of recurrence, age-appropriate cancer screening and role of genetic testing.    Rachel Moulds, MD 07/08/2022 8:52 AM

## 2022-07-29 NOTE — Progress Notes (Signed)
46 y.o. [id number] Divorced [place] American female here for annual exam.    Mirena IUD removed on 06/23/22 due to ongoing vaginal bleeding.  Had a period lasting 3 - 4 days after her IUD was removed.  Patient has two small fibroids.   Declines STD screening.   Takes B complex, zinc, vit E.  PCP: Herbie Drape, MD  Patient's last menstrual period was 06/24/2022 (exact date).           Sexually active: No.  The current method of family planning is Bil.salpingectomy.    Exercising: Yes.     walking Smoker:  no  Health Maintenance: Pap:   07-11-21 Neg:Neg HR HPV History of abnormal Pap:  no MMG:  06-12-22 Neg/BiRads2 Colonoscopy:  5 years ago;She will do at the end of the year.  BMD:   n/a  Result  n/a TDaP:  04-10-21  Td Gardasil:   no HIV: 03-08-21 NR Hep C: no Screening Labs:  PCP   reports that she has never smoked. She has never used smokeless tobacco. She reports that she does not drink alcohol and does not use drugs.  Past Medical History:  Diagnosis Date   Anxiety    Cerebral venous thrombosis    Depression    Ectopic pregnancy    Family history of prostate cancer 02/03/2022   Family history of uterine cancer 02/03/2022   Migraine    Osteoarthritis    Plantar fasciitis    PTSD (post-traumatic stress disorder)     Past Surgical History:  Procedure Laterality Date   ECTOPIC PREGNANCY SURGERY  10/2000   ECTOPIC PREGNANCY SURGERY  01/2004    Current Outpatient Medications  Medication Sig Dispense Refill   acetaminophen (TYLENOL) 325 MG tablet Take 2 tablets (650 mg total) by mouth every 6 (six) hours as needed for fever or mild pain.     aspirin EC 81 MG tablet Take 81 mg by mouth daily. Swallow whole.     buPROPion (WELLBUTRIN XL) 150 MG 24 hr tablet Take 150 mg by mouth daily.     escitalopram (LEXAPRO) 10 MG tablet Take 10 mg by mouth at bedtime.     No current facility-administered medications for this visit.    Family History  Problem Relation Age of Onset    Hypertension Mother    Seizures Mother    Uterine cancer Mother        dx 64s   Hypertension Sister    Prostate cancer Maternal Uncle        d. early 36s; mets   Cancer Paternal Aunt        unknown type; d. 34s   Brain cancer Paternal Uncle        d. <60   Cancer Paternal Uncle        unknown type; dx unknown age   Lung cancer Maternal Grandmother        dx 39s; smoking hx   Prostate cancer Maternal Grandfather        d. mid-late 54s; mets   Cancer Paternal Grandmother        unknown type; mets; d. late 60s-early 70s   Diabetes Paternal Grandfather     Review of Systems  All other systems reviewed and are negative.   Exam:   BP 121/78   Pulse 70   Ht 5' 2.5" (1.588 m)   Wt 193 lb (87.5 kg)   LMP 06/24/2022 (Exact Date)   SpO2 94%   BMI 34.74 kg/m  General appearance: alert, cooperative and appears stated age Head: normocephalic, without obvious abnormality, atraumatic Neck: no adenopathy, supple, symmetrical, trachea midline and thyroid normal to inspection and palpation Lungs: clear to auscultation bilaterally Breasts: normal appearance, no masses or tenderness, No nipple retraction or dimpling, No nipple discharge or bleeding, No axillary adenopathy Heart: regular rate and rhythm Abdomen: soft, non-tender; no masses, no organomegaly Extremities: extremities normal, atraumatic, no cyanosis or edema Skin: skin color, texture, turgor normal. No rashes or lesions Lymph nodes: cervical, supraclavicular, and axillary nodes normal. Neurologic: grossly normal  Pelvic: External genitalia:  no lesions              No abnormal inguinal nodes palpated.              Urethra:  normal appearing urethra with no masses, tenderness or lesions              Bartholins and Skenes: normal                 Vagina: normal appearing vagina with normal color and discharge, no lesions              Cervix: no lesions. Menses starting.               Pap taken: No. Bimanual Exam:   Uterus:  normal size, contour, position, consistency, mobility, non-tender              Adnexa: no mass, fullness, tenderness              Rectal exam: yes.  Confirms.              Anus:  normal sphincter tone, no lesions  Chaperone was present for exam:  Marchelle Folks, CMA  Assessment:   Well woman visit with gynecologic exam. Status post bilateral salpingectomy due to ruptured ectopic pregnancies.  Hx dural venous sinus thromboses.   Positive LA due to Eliquis. Off Eliquis. FH cancers.  Patient had negative genetic testing.   Plan: Mammogram screening discussed. Self breast awareness reviewed. Pap and HR HPV 2027. Guidelines for Calcium, Vitamin D, regular exercise program including cardiovascular and weight bearing exercise.   Follow up annually and prn.   After visit summary provided.

## 2022-07-31 ENCOUNTER — Encounter: Payer: Self-pay | Admitting: Obstetrics and Gynecology

## 2022-07-31 ENCOUNTER — Ambulatory Visit (INDEPENDENT_AMBULATORY_CARE_PROVIDER_SITE_OTHER): Admitting: Obstetrics and Gynecology

## 2022-07-31 VITALS — BP 121/78 | HR 70 | Ht 62.5 in | Wt 193.0 lb

## 2022-07-31 DIAGNOSIS — Z01419 Encounter for gynecological examination (general) (routine) without abnormal findings: Secondary | ICD-10-CM

## 2022-07-31 NOTE — Patient Instructions (Signed)
EXERCISE AND DIET:  We recommended that you start or continue a regular exercise program for good health. Regular exercise means any activity that makes your heart beat faster and makes you sweat.  We recommend exercising at least 30 minutes per day at least 3 days a week, preferably 4 or 5.  We also recommend a diet low in fat and sugar.  Inactivity, poor dietary choices and obesity can cause diabetes, heart attack, stroke, and kidney damage, among others.    ALCOHOL AND SMOKING:  Women should limit their alcohol intake to no more than 7 drinks/beers/glasses of wine (combined, not each!) per week. Moderation of alcohol intake to this level decreases your risk of breast cancer and liver damage. And of course, no recreational drugs are part of a healthy lifestyle.  And absolutely no smoking or even second hand smoke. Most people know smoking can cause heart and lung diseases, but did you know it also contributes to weakening of your bones? Aging of your skin?  Yellowing of your teeth and nails?  CALCIUM AND VITAMIN D:  Adequate intake of calcium and Vitamin D are recommended.  The recommendations for exact amounts of these supplements seem to change often, but generally speaking 600 mg of calcium (either carbonate or citrate) and 800 units of Vitamin D per day seems prudent. Certain women may benefit from higher intake of Vitamin D.  If you are among these women, your doctor will have told you during your visit.    PAP SMEARS:  Pap smears, to check for cervical cancer or precancers,  have traditionally been done yearly, although recent scientific advances have shown that most women can have pap smears less often.  However, every woman still should have a physical exam from her gynecologist every year. It will include a breast check, inspection of the vulva and vagina to check for abnormal growths or skin changes, a visual exam of the cervix, and then an exam to evaluate the size and shape of the uterus and  ovaries.  And after 48 years of age, a rectal exam is indicated to check for rectal cancers. We will also provide age appropriate advice regarding health maintenance, like when you should have certain vaccines, screening for sexually transmitted diseases, bone density testing, colonoscopy, mammograms, etc.   MAMMOGRAMS:  All women over 40 years old should have a yearly mammogram. Many facilities now offer a "3D" mammogram, which may cost around $50 extra out of pocket. If possible,  we recommend you accept the option to have the 3D mammogram performed.  It both reduces the number of women who will be called back for extra views which then turn out to be normal, and it is better than the routine mammogram at detecting truly abnormal areas.    COLONOSCOPY:  Colonoscopy to screen for colon cancer is recommended for all women at age 50.  We know, you hate the idea of the prep.  We agree, BUT, having colon cancer and not knowing it is worse!!  Colon cancer so often starts as a polyp that can be seen and removed at colonscopy, which can quite literally save your life!  And if your first colonoscopy is normal and you have no family history of colon cancer, most women don't have to have it again for 10 years.  Once every ten years, you can do something that may end up saving your life, right?  We will be happy to help you get it scheduled when you are ready.    Be sure to check your insurance coverage so you understand how much it will cost.  It may be covered as a preventative service at no cost, but you should check your particular policy.    Calcium Content in Foods Calcium is the most abundant mineral in the body. Most of the body's calcium supply is stored in bones and teeth. Calcium helps many parts of the body function normally, including: Blood and blood vessels. Nerves. Hormones. Muscles. Bones and teeth. When your calcium stores are low, you may be at risk for low bone mass, bone loss, and broken bones  (fractures). When you get enough calcium, it helps to support strong bones and teeth throughout your life. Calcium is especially important for: Children during growth spurts. Girls during adolescence. Women who are pregnant or breastfeeding. Women after their menstrual cycle stops (postmenopause). Women whose menstrual cycle has stopped due to anorexia nervosa or regular intense exercise. People who cannot eat or digest dairy products. Vegans. Recommended daily amounts of calcium: Women (ages 19 to 50): 1,000 mg per day. Women (ages 51 and older): 1,200 mg per day. Men (ages 19 to 70): 1,000 mg per day. Men (ages 71 and older): 1,200 mg per day. Women (ages 9 to 18): 1,300 mg per day. Men (ages 9 to 18): 1,300 mg per day. General information Eat foods that are high in calcium. Try to get most of your calcium from food. Some people may benefit from taking calcium supplements. Check with your health care provider or diet and nutrition specialist (dietitian) before starting any calcium supplements. Calcium supplements may interact with certain medicines. Too much calcium may cause other health problems, such as constipation and kidney stones. For the body to absorb calcium, it needs vitamin D. Sources of vitamin D include: Skin exposure to direct sunlight. Foods, such as egg yolks, liver, mushrooms, saltwater fish, and fortified milk. Vitamin D supplements. Check with your health care provider or dietitian before starting any vitamin D supplements. What foods are high in calcium?  Foods that are high in calcium contain more than 100 milligrams per serving. Fruits Fortified orange juice or other fruit juice, 300 mg per 8 oz serving. Vegetables Collard greens, 360 mg per 8 oz serving. Kale, 100 mg per 8 oz serving. Bok choy, 160 mg per 8 oz serving. Grains Fortified ready-to-eat cereals, 100 to 1,000 mg per 8 oz serving. Fortified frozen waffles, 200 mg in 2 waffles. Oatmeal, 140 mg in  1 cup. Meats and other proteins Sardines, canned with bones, 325 mg per 3 oz serving. Salmon, canned with bones, 180 mg per 3 oz serving. Canned shrimp, 125 mg per 3 oz serving. Baked beans, 160 mg per 4 oz serving. Tofu, firm, made with calcium sulfate, 253 mg per 4 oz serving. Dairy Yogurt, plain, low-fat, 310 mg per 6 oz serving. Nonfat milk, 300 mg per 8 oz serving. American cheese, 195 mg per 1 oz serving. Cheddar cheese, 205 mg per 1 oz serving. Cottage cheese 2%, 105 mg per 4 oz serving. Fortified soy, rice, or almond milk, 300 mg per 8 oz serving. Mozzarella, part skim, 210 mg per 1 oz serving. The items listed above may not be a complete list of foods high in calcium. Actual amounts of calcium may be different depending on processing. Contact a dietitian for more information. What foods are lower in calcium? Foods that are lower in calcium contain 50 mg or less per serving. Fruits Apple, about 6 mg. Banana, about 12 mg.   Vegetables Lettuce, 19 mg per 2 oz serving. Tomato, about 11 mg. Grains Rice, 4 mg per 6 oz serving. Boiled potatoes, 14 mg per 8 oz serving. White bread, 6 mg per slice. Meats and other proteins Egg, 27 mg per 2 oz serving. Red meat, 7 mg per 4 oz serving. Chicken, 17 mg per 4 oz serving. Fish, cod, or trout, 20 mg per 4 oz serving. Dairy Cream cheese, regular, 14 mg per 1 Tbsp serving. Brie cheese, 50 mg per 1 oz serving. Parmesan cheese, 70 mg per 1 Tbsp serving. The items listed above may not be a complete list of foods lower in calcium. Actual amounts of calcium may be different depending on processing. Contact a dietitian for more information. Summary Calcium is an important mineral in the body because it affects many functions. Getting enough calcium helps support strong bones and teeth throughout your life. Try to get most of your calcium from food. Calcium supplements may interact with certain medicines. Check with your health care provider  or dietitian before starting any calcium supplements. This information is not intended to replace advice given to you by your health care provider. Make sure you discuss any questions you have with your health care provider. Document Revised: 03/28/2020 Document Reviewed: 03/28/2020 Elsevier Patient Education  2023 Elsevier Inc.  

## 2022-10-15 ENCOUNTER — Encounter: Payer: Self-pay | Admitting: Gastroenterology

## 2022-10-15 ENCOUNTER — Encounter: Payer: Self-pay | Admitting: Family Medicine

## 2022-10-15 ENCOUNTER — Ambulatory Visit: Admitting: Family Medicine

## 2022-10-15 VITALS — BP 124/72 | HR 78 | Temp 97.6°F | Ht 62.5 in | Wt 190.4 lb

## 2022-10-15 DIAGNOSIS — Z1211 Encounter for screening for malignant neoplasm of colon: Secondary | ICD-10-CM

## 2022-10-15 DIAGNOSIS — G08 Intracranial and intraspinal phlebitis and thrombophlebitis: Secondary | ICD-10-CM | POA: Diagnosis not present

## 2022-10-15 DIAGNOSIS — F419 Anxiety disorder, unspecified: Secondary | ICD-10-CM

## 2022-10-15 DIAGNOSIS — F3342 Major depressive disorder, recurrent, in full remission: Secondary | ICD-10-CM | POA: Diagnosis not present

## 2022-10-15 DIAGNOSIS — F4310 Post-traumatic stress disorder, unspecified: Secondary | ICD-10-CM

## 2022-10-15 NOTE — Progress Notes (Signed)
[facility] [address] [place] [address] Dept: [phone] Dept Fax: [phone]  Chronic Care Office Visit  Subjective:    Patient ID: Jasmine [name], female    DOB: Dec 26, 1975, 46 y.o..   MRN: [id number]  Chief Complaint  Patient presents with   Follow-up    6 month f/u.  No concerns.  Fasting today.      History of Present Illness:  Patient is in today for reassessment of chronic medical issues.  [name] has a history of an acute dural sinus thrombosis in March 2022. She was last seen by Dr. Al Pimple (hematology) in July. She is off of Eliquis and is taking a daily aspirin.  Her hypercoagulability evaluation was benign. She notes she is doing well and not having any headaches different than her usual migraines.    [name] has a past history of PTSD, anxiety, and major depressive disorder. She is currently managed on Lexapro 10 mg daily and bupropion XL 150 mg daily. She is in counseling monthly with [name] at [facility]. Her psych meds are being managed by [name], NP. She feels she is doing well overall, though she does find current world events to be a little anxiety provoking. She is working on her [employer] and finishes in the Spring. 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  Past Medical History: Patient Active Problem List   Diagnosis Date Noted   Genetic testing 02/13/2022   Family history of uterine cancer 02/03/2022   Family history of prostate cancer 02/03/2022   Post traumatic stress disorder (PTSD) 01/13/2022   IUD (intrauterine device) in place 01/13/2022   Axillary lymphadenopathy 06/27/2021   Prediabetes 04/10/2021   Obesity (BMI 35.0-39.9 without comorbidity) 04/10/2021   Anxiety 03/08/2021   Dural venous sinus thrombosis 03/07/2021   Recurrent major depression in full remission (HCC) 03/05/2016   Past Surgical History:  Procedure Laterality Date    ECTOPIC PREGNANCY SURGERY  10/2000   ECTOPIC PREGNANCY SURGERY  01/2004   Family History  Problem Relation Age of Onset   Hypertension Mother    Seizures Mother    Uterine cancer Mother        dx 30s   Hypertension Sister    Prostate cancer Maternal Uncle        d. early 64s; mets   Cancer Paternal Aunt        unknown type; d. 60s   Brain cancer Paternal Uncle        d. <60   Cancer Paternal Uncle        unknown type; dx unknown age   Lung cancer Maternal Grandmother        dx 51s; smoking hx   Prostate cancer Maternal Grandfather        d. mid-late 77s; mets   Cancer Paternal Grandmother        unknown type; mets; d. late 60s-early 70s   Diabetes Paternal Grandfather    Outpatient Medications Prior to Visit  Medication Sig Dispense Refill   acetaminophen (TYLENOL) 325 MG tablet Take 2 tablets (650 mg total) by mouth every 6 (six) hours as needed for fever or mild pain.     aspirin EC 81 MG tablet Take 81 mg by mouth daily. Swallow whole.     buPROPion (WELLBUTRIN XL) 150 MG 24 hr tablet Take 150 mg by mouth daily.     escitalopram (LEXAPRO) 10 MG tablet Take 10  mg by mouth at bedtime.     No facility-administered medications prior to visit.   No Known Allergies    Objective:   Today's Vitals   10/15/22 0835  BP: 124/72  Pulse: 78  Temp: 97.6 F (36.4 C)  TempSrc: Temporal  SpO2: 99%  Weight: 190 lb 6.4 oz (86.4 kg)  Height: 5' 2.5" (1.588 m)   Body mass index is 34.27 kg/m.   General: Well developed, well nourished. No acute distress. Psych: Alert and oriented. Normal mood and affect.  Health Maintenance Due  Topic Date Due   Hepatitis C Screening  Never done      Assessment & Plan:   1. Dural venous sinus thrombosis Reviewed Dr. Lovenia Kim consult note and lab results. Managing well off of Eliquis. She will continue a daily 81 mg aspirin.  2. Post traumatic stress disorder (PTSD) 3. Recurrent major depression in full remission ([place]) 4. Anxiety Ms.  Asman appears to have good insight into her mental state. She will continue counseling. She increases the frequency of this in the winter months, due to triggers related to her past trauma. Continue Lexapro 10 mg daily and bupropion XL 150 mg daily.  5. Screening for colon cancer  - Ambulatory referral to Gastroenterology  Return in about 6 months (around 04/15/2023).   [name], MD

## 2022-11-10 ENCOUNTER — Ambulatory Visit (AMBULATORY_SURGERY_CENTER): Payer: Self-pay

## 2022-11-10 VITALS — Ht 62.5 in | Wt 190.0 lb

## 2022-11-10 DIAGNOSIS — Z1211 Encounter for screening for malignant neoplasm of colon: Secondary | ICD-10-CM

## 2022-11-10 MED ORDER — PEG 3350-KCL-NA BICARB-NACL 420 G PO SOLR: 4000.0000 mL | Freq: Once | ORAL | 0 refills | Status: DC

## 2022-11-10 MED ORDER — NA SULFATE-K SULFATE-MG SULF 17.5-3.13-1.6 GM/177ML PO SOLN: 1.0000 | Freq: Once | ORAL | 0 refills | Status: AC

## 2022-11-10 NOTE — Progress Notes (Signed)
No egg or soy allergy known to patient;  No issues known to pt with past sedation with any surgeries or procedures; Patient denies ever being told they had issues or difficulty with intubation;  No FH of Malignant Hyperthermia; Pt is not on diet pills; Pt is not on home 02; Pt is not on blood thinners;  Pt denies issues with constipation;  No A fib or A flutter; Have any cardiac testing pending--NO Pt instructed to use Singlecare[employer]com or GoodRx for a price reduction on prep;   Insurance verified during PV appt=[employer]  Patient's chart reviewed by Cathlyn Parsons CNRA prior to previsit and patient appropriate for the [facility].  Previsit completed and red dot placed by patient's name on their procedure day (on provider's schedule);    GoodRx coupon given to patient during Pv appt and patient agreed to change pharmacy to [facility] for the prep

## 2022-11-25 ENCOUNTER — Encounter: Payer: Self-pay | Admitting: Certified Registered Nurse Anesthetist

## 2022-11-26 ENCOUNTER — Encounter: Payer: Self-pay | Admitting: Gastroenterology

## 2022-11-26 ENCOUNTER — Ambulatory Visit (AMBULATORY_SURGERY_CENTER): Admitting: Gastroenterology

## 2022-11-26 VITALS — BP 127/99 | HR 77 | Temp 96.9°F | Resp 12 | Ht 62.5 in | Wt 190.0 lb

## 2022-11-26 DIAGNOSIS — Z1211 Encounter for screening for malignant neoplasm of colon: Secondary | ICD-10-CM

## 2022-11-26 MED ORDER — SODIUM CHLORIDE 0.9 % IV SOLN: 500.0000 mL | INTRAVENOUS | Status: DC

## 2022-11-26 NOTE — Op Note (Signed)
[facility] Patient Name: Jasmine [name] Procedure Date: 11/26/2022 10:36 AM MRN: [id number] Endoscopist: Sherilyn Cooter L. Myrtie Neither , MD, [id number] Age: 46 Referring MD:  Date of Birth: Apr 22, 1976 Gender: Female Account #: [id number] Procedure:                Colonoscopy Indications:              Screening for colorectal malignant neoplasm, This                            is the patient's first screening colonoscopy Medicines:                Monitored Anesthesia Care Procedure:                Pre-Anesthesia Assessment:                           - Prior to the procedure, a History and Physical                            was performed, and patient medications and                            allergies were reviewed. The patient's tolerance of                            previous anesthesia was also reviewed. The risks                            and benefits of the procedure and the sedation                            options and risks were discussed with the patient.                            All questions were answered, and informed consent                            was obtained. Prior Anticoagulants: The patient has                            taken no anticoagulant or antiplatelet agents. ASA                            Grade Assessment: III - A patient with severe                            systemic disease. After reviewing the risks and                            benefits, the patient was deemed in satisfactory                            condition to undergo the procedure.  After obtaining informed consent, the colonoscope                            was passed under direct vision. Throughout the                            procedure, the patient's blood pressure, pulse, and                            oxygen saturations were monitored continuously. The                            CF [id number] #[id number] was introduced through the anus                            and  advanced to the the cecum, identified by                            appendiceal orifice and ileocecal valve. The                            colonoscopy was performed without difficulty. The                            patient tolerated the procedure well. The quality                            of the bowel preparation was good. The ileocecal                            valve, appendiceal orifice, and rectum were                            photographed. Scope In: 10:51:55 AM Scope Out: 11:03:56 AM Scope Withdrawal Time: 0 hours 8 minutes 45 seconds  Total Procedure Duration: 0 hours 12 minutes 1 second  Findings:                 The perianal and digital rectal examinations were                            normal.                           Repeat examination of right colon under NBI                            performed.                           Small internal hemorrhoids were found.                           The exam was otherwise without abnormality on  direct and retroflexion views. Complications:            No immediate complications. Estimated Blood Loss:     Estimated blood loss: none. Impression:               - Internal hemorrhoids.                           - The examination was otherwise normal on direct                            and retroflexion views.                           - No specimens collected. Recommendation:           - Patient has a contact number available for                            emergencies. The signs and symptoms of potential                            delayed complications were discussed with the                            patient. Return to normal activities tomorrow.                            Written discharge instructions were provided to the                            patient.                           - Resume previous diet.                           - Continue present medications.                           - Repeat  colonoscopy in 10 years for screening                            purposes. Ilka Lovick L. Myrtie Neither, MD 11/26/2022 11:07:15 AM This report has been signed electronically.

## 2022-11-26 NOTE — Progress Notes (Signed)
Report given to PACU, vss 

## 2022-11-26 NOTE — Progress Notes (Signed)
History and Physical:  This patient presents for endoscopic testing for: Encounter Diagnosis  Name Primary?   Colon cancer screening Yes    Average risk for CRC, first screening exam Patient denies chronic abdominal pain, rectal bleeding, constipation or diarrhea.   Patient is otherwise without complaints or active issues today.   Past Medical History: Past Medical History:  Diagnosis Date   Anxiety    on meds   Cerebral venous thrombosis 2022   treated with Eliquis-stopped med in Feb 2023   Depression    on meds   Ectopic pregnancy    Family history of prostate cancer 02/03/2022   Family history of uterine cancer 02/03/2022   Migraine    Osteoarthritis    bilateral knees   Plantar fasciitis    PTSD (post-traumatic stress disorder)    on meds   Stroke (HCC)    2022-cerebral venous thrombosis     Past Surgical History: Past Surgical History:  Procedure Laterality Date   COLONOSCOPY  2015   in [employer]-normal per pt   ECTOPIC PREGNANCY SURGERY Left 2001   fallopian tube removed due to ectopic pregnancy   ECTOPIC PREGNANCY SURGERY Right 2004   fallopian tube removed due to ectopic pregnancy   SHOULDER ARTHROSCOPY Right 2017   removed bone spur and shaved off bone bursa    Allergies: No Known Allergies  Outpatient Meds: Current Outpatient Medications  Medication Sig Dispense Refill   aspirin EC 81 MG tablet Take 81 mg by mouth daily. Swallow whole.     buPROPion (WELLBUTRIN XL) 150 MG 24 hr tablet Take 300 mg by mouth daily.     escitalopram (LEXAPRO) 10 MG tablet Take 10 mg by mouth at bedtime.     acetaminophen (TYLENOL) 325 MG tablet Take 2 tablets (650 mg total) by mouth every 6 (six) hours as needed for fever or mild pain.     Current Facility-Administered Medications  Medication Dose Route Frequency Provider Last Rate Last Admin   0.9 %  sodium chloride infusion  500 mL Intravenous Continuous Danis, Starr Lake III, MD           ___________________________________________________________________ Objective   Exam:  BP 126/83   Pulse 68   Temp (!) 96.9 F (36.1 C)   Ht 5' 2.5" (1.588 m)   Wt 190 lb (86.2 kg)   LMP 06/24/2022 (Exact Date)   SpO2 100%   BMI 34.20 kg/m   CV: regular , S1/S2 Resp: clear to auscultation bilaterally, normal RR and effort noted GI: soft, no tenderness, with active bowel sounds.   Assessment: Encounter Diagnosis  Name Primary?   Colon cancer screening Yes     Plan: Colonoscopy  The benefits and risks of the planned procedure were described in detail with the patient or (when appropriate) their health care proxy.  Risks were outlined as including, but not limited to, bleeding, infection, perforation, adverse medication reaction leading to cardiac or pulmonary decompensation, pancreatitis (if ERCP).  The limitation of incomplete mucosal visualization was also discussed.  No guarantees or warranties were given.    The patient is appropriate for an endoscopic procedure in the ambulatory setting.   - Amada Jupiter, MD

## 2022-11-26 NOTE — Patient Instructions (Signed)
Continue present medications. Resume previous diet.   YOU HAD AN ENDOSCOPIC PROCEDURE TODAY AT THE [facility]:   Refer to the procedure report that was given to you for any specific questions about what was found during the examination.  If the procedure report does not answer your questions, please call your gastroenterologist to clarify.  If you requested that your care partner not be given the details of your procedure findings, then the procedure report has been included in a sealed envelope for you to review at your convenience later.  YOU SHOULD EXPECT: Some feelings of bloating in the abdomen. Passage of more gas than usual.  Walking can help get rid of the air that was put into your GI tract during the procedure and reduce the bloating. If you had a lower endoscopy (such as a colonoscopy or flexible sigmoidoscopy) you may notice spotting of blood in your stool or on the toilet paper. If you underwent a bowel prep for your procedure, you may not have a normal bowel movement for a few days.  Please Note:  You might notice some irritation and congestion in your nose or some drainage.  This is from the oxygen used during your procedure.  There is no need for concern and it should clear up in a day or so.  SYMPTOMS TO REPORT IMMEDIATELY:  Following lower endoscopy (colonoscopy or flexible sigmoidoscopy):  Excessive amounts of blood in the stool  Significant tenderness or worsening of abdominal pains  Swelling of the abdomen that is new, acute  Fever of 100F or higher  For urgent or emergent issues, a gastroenterologist can be reached at any hour by calling [phone]. Do not use MyChart messaging for urgent concerns.    DIET:  We do recommend a small meal at first, but then you may proceed to your regular diet.  Drink plenty of fluids but you should avoid alcoholic beverages for 24 hours.  ACTIVITY:  You should plan to take it easy for the rest of today and you should NOT  DRIVE or use heavy machinery until tomorrow (because of the sedation medicines used during the test).    FOLLOW UP: Our staff will call the number listed on your records the next business day following your procedure.  We will call around 7:15- 8:00 am to check on you and address any questions or concerns that you may have regarding the information given to you following your procedure. If we do not reach you, we will leave a message.     If any biopsies were taken you will be contacted by phone or by letter within the next 1-3 weeks.  Please call us at [phone] if you have not heard about the biopsies in 3 weeks.    SIGNATURES/CONFIDENTIALITY: You and/or your care partner have signed paperwork which will be entered into your electronic medical record.  These signatures attest to the fact that that the information above on your After Visit Summary has been reviewed and is understood.  Full responsibility of the confidentiality of this discharge information lies with you and/or your care-partner.

## 2022-11-27 ENCOUNTER — Telehealth: Payer: Self-pay | Admitting: *Deleted

## 2022-11-27 NOTE — Telephone Encounter (Signed)
  Follow up Call-     11/26/2022   10:15 AM  Call back number  Post procedure Call Back phone  # [phone]  Permission to leave phone message Yes   [facility]

## 2023-04-15 ENCOUNTER — Ambulatory Visit: Admitting: Family Medicine

## 2023-04-15 VITALS — BP 120/70 | HR 74 | Temp 98.1°F | Ht 62.5 in | Wt 188.4 lb

## 2023-04-15 DIAGNOSIS — G08 Intracranial and intraspinal phlebitis and thrombophlebitis: Secondary | ICD-10-CM

## 2023-04-15 DIAGNOSIS — R7303 Prediabetes: Secondary | ICD-10-CM

## 2023-04-15 DIAGNOSIS — Z1159 Encounter for screening for other viral diseases: Secondary | ICD-10-CM

## 2023-04-15 DIAGNOSIS — F3342 Major depressive disorder, recurrent, in full remission: Secondary | ICD-10-CM | POA: Diagnosis not present

## 2023-04-15 DIAGNOSIS — Z86718 Personal history of other venous thrombosis and embolism: Secondary | ICD-10-CM | POA: Diagnosis not present

## 2023-04-15 LAB — HEMOGLOBIN A1C: Hgb A1c MFr Bld: 6.3 % (ref 4.6–6.5)

## 2023-04-15 LAB — GLUCOSE, RANDOM: Glucose, Bld: 103 mg/dL — ABNORMAL HIGH (ref 70–99)

## 2023-04-15 NOTE — Assessment & Plan Note (Signed)
I will do annual screening for diabetes.

## 2023-04-15 NOTE — Assessment & Plan Note (Signed)
Resolved. No symptoms of recurrence. BP is in excellent control. We will continue expectant management at this point.

## 2023-04-15 NOTE — Assessment & Plan Note (Signed)
Stable. Continue Lexapro 10 mg daily and bupropion XL 150 mg.

## 2023-04-15 NOTE — Progress Notes (Signed)
[facility] [address] [place] [address] Dept: [phone] Dept Fax: [phone]  Chronic Care Office Visit  Subjective:    Patient ID: Jasmine [name], female    DOB: [birth year], 47 y.o..   MRN: [id number]  Chief Complaint  Patient presents with   Medical Management of Chronic Issues    6 month f/u.  No concerns.  Fasting today.      History of Present Illness:  Patient is in today for reassessment of chronic medical issues.  Jasmine [name] has a history of an acute dural sinus thrombosis in March 2022. She is off of Eliquis and is taking a daily aspirin.  Her hypercoagulability evaluation was benign. She was released by Dr. Al Pimple (neurology) for routine care. She notes she is doing well and not having any headaches different than her usual migraines.    Jasmine [name] has a past history of PTSD, anxiety, and major depressive disorder. She is currently managed on Lexapro 10 mg daily and bupropion XL 150 mg. She feels she is doing well overall. She is working on her [employer] and finishes soon. 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   Recurrent major depression in full remission (HCC) 03/05/2016   Past Surgical History:  Procedure Laterality Date   COLONOSCOPY  2015   in [employer]-normal per pt   ECTOPIC PREGNANCY SURGERY Left 2001   fallopian tube removed due to ectopic pregnancy   ECTOPIC PREGNANCY SURGERY Right 2004   fallopian tube removed due to ectopic pregnancy   SHOULDER ARTHROSCOPY Right 2017   removed bone spur and shaved off bone bursa   Family History  Problem  Relation Age of Onset   Hypertension Mother    Seizures Mother    Uterine cancer Mother        dx 30s   Hypertension Sister    Prostate cancer Maternal Uncle        d. early 58s; mets   Cancer Paternal Aunt        unknown type; d. 76s   Brain cancer Paternal Uncle        d. <60   Cancer Paternal Uncle        unknown type; dx unknown age   Lung cancer Maternal Grandmother        dx 33s; smoking hx   Prostate cancer Maternal Grandfather        d. mid-late 68s; mets   Cancer Paternal Grandmother        unknown type; mets; d. late 60s-early 70s   Diabetes Paternal Grandfather    Colon polyps Neg Hx    Colon cancer Neg Hx    Esophageal cancer Neg Hx    Stomach cancer Neg Hx    Rectal cancer Neg Hx    Outpatient Medications Prior to Visit  Medication Sig Dispense Refill   acetaminophen (TYLENOL) 325 MG tablet Take 2 tablets (650 mg total) by mouth every 6 (six) hours as needed for fever or mild pain.     aspirin EC 81 MG tablet Take 81 mg by mouth daily. Swallow whole.     buPROPion (WELLBUTRIN XL)  150 MG 24 hr tablet Take 300 mg by mouth daily.     escitalopram (LEXAPRO) 10 MG tablet Take 10 mg by mouth at bedtime.     No facility-administered medications prior to visit.   No Known Allergies Objective:   Today's Vitals   04/15/23 0806  BP: 120/70  Pulse: 74  Temp: 98.1 F (36.7 C)  TempSrc: Temporal  SpO2: 98%  Weight: 188 lb 6.4 oz (85.5 kg)  Height: 5' 2.5" (1.588 m)   Body mass index is 33.91 kg/m.   General: Well developed, well nourished. No acute distress. Psych: Alert and oriented. Normal mood and affect.  Health Maintenance Due  Topic Date Due   Hepatitis C Screening  Never done     Assessment & Plan:   Problem List Items Addressed This Visit       Cardiovascular and Mediastinum   Dural venous sinus thrombosis - Primary    Resolved. No symptoms of recurrence. BP is in excellent control. We will continue expectant management at this point.         Other   Recurrent major depression in full remission (HCC)    Stable. Continue Lexapro 10 mg daily and bupropion XL 150 mg.      Prediabetes    I will do annual screening for diabetes.      Relevant Orders   Glucose, random   Hemoglobin A1c   Other Visit Diagnoses     Encounter for hepatitis C screening test for low risk patient       Relevant Orders   HCV Ab w Reflex to Quant PCR       Return in about 1 year (around 04/14/2024) for Reassessment.   Loyola Mast, MD

## 2023-04-16 LAB — HCV AB W REFLEX TO QUANT PCR

## 2023-05-04 ENCOUNTER — Emergency Department (HOSPITAL_BASED_OUTPATIENT_CLINIC_OR_DEPARTMENT_OTHER)

## 2023-05-04 ENCOUNTER — Encounter (HOSPITAL_BASED_OUTPATIENT_CLINIC_OR_DEPARTMENT_OTHER): Payer: Self-pay

## 2023-05-04 ENCOUNTER — Other Ambulatory Visit: Payer: Self-pay

## 2023-05-04 ENCOUNTER — Emergency Department (HOSPITAL_BASED_OUTPATIENT_CLINIC_OR_DEPARTMENT_OTHER)
Admission: EM | Admit: 2023-05-04 | Discharge: 2023-05-04 | Disposition: A | Discharge status: Home / Self Care | Attending: Emergency Medicine | Admitting: Emergency Medicine

## 2023-05-04 DIAGNOSIS — M779 Enthesopathy, unspecified: Secondary | ICD-10-CM

## 2023-05-04 DIAGNOSIS — M25511 Pain in right shoulder: Secondary | ICD-10-CM

## 2023-05-04 DIAGNOSIS — M778 Other enthesopathies, not elsewhere classified: Secondary | ICD-10-CM | POA: Diagnosis not present

## 2023-05-04 MED ORDER — DEXAMETHASONE SODIUM PHOSPHATE 10 MG/ML IJ SOLN
10.0000 mg | Freq: Once | INTRAMUSCULAR | Status: AC
  Administered 2023-05-04: 10 mg via INTRAMUSCULAR
  Filled 2023-05-04: qty 1

## 2023-05-04 MED ORDER — METHOCARBAMOL 500 MG PO TABS
500.0000 mg | ORAL_TABLET | Freq: Once | ORAL | Status: AC
  Administered 2023-05-04: 500 mg via ORAL
  Filled 2023-05-04: qty 1

## 2023-05-04 MED ORDER — KETOROLAC TROMETHAMINE 60 MG/2ML IM SOLN
30.0000 mg | Freq: Once | INTRAMUSCULAR | Status: AC
  Administered 2023-05-04: 30 mg via INTRAMUSCULAR
  Filled 2023-05-04: qty 2

## 2023-05-04 MED ORDER — NAPROXEN 250 MG PO TABS
500.0000 mg | ORAL_TABLET | Freq: Once | ORAL | Status: AC
Start: 2023-05-04 — End: 2023-05-04
  Administered 2023-05-04: 500 mg via ORAL
  Filled 2023-05-04: qty 2

## 2023-05-04 NOTE — ED Triage Notes (Signed)
Pt complains of R shoulder pain since yesterday morning. Pt states she had a bone spur surgery on the shoulder in 2017 but no issues since then. Pt states she has not been able to sleep with the pain and ice has not helped.

## 2023-05-04 NOTE — ED Provider Notes (Signed)
[facility] Provider Note   CSN: [id number] Arrival date & time: 05/04/23  0516     History  Chief Complaint  Patient presents with   Shoulder Pain    right    Jasmine [name] is a 47 y.o. female.  The history is provided by the patient.  Shoulder Pain Location:  Shoulder Shoulder location:  R shoulder Injury: no   Pain details:    Quality:  Aching   Radiates to:  Does not radiate   Severity:  Severe   Onset quality:  Gradual   Timing:  Constant   Progression:  Unchanged Foreign body present:  No foreign bodies Prior injury to area:  No (reports previous surgery) Relieved by:  Nothing Worsened by:  Nothing Ineffective treatments:  Ice Associated symptoms: no back pain, no fever, no muscle weakness, no neck pain, no numbness, no swelling and no tingling   Risk factors: no concern for non-accidental trauma        Home Medications Prior to Admission medications   Medication Sig Start Date End Date Taking? Authorizing Provider  acetaminophen (TYLENOL) 325 MG tablet Take 2 tablets (650 mg total) by mouth every 6 (six) hours as needed for fever or mild pain. 03/11/21   Lonia Blood, MD  aspirin EC 81 MG tablet Take 81 mg by mouth daily. Swallow whole.    [provider]  buPROPion (WELLBUTRIN XL) 150 MG 24 hr tablet Take 300 mg by mouth daily.    [provider]  escitalopram (LEXAPRO) 10 MG tablet Take 10 mg by mouth at bedtime.    [provider]      Allergies    Patient has no known allergies.    Review of Systems   Review of Systems  Constitutional:  Negative for fever.  HENT:  Negative for facial swelling.   Musculoskeletal:  Positive for arthralgias. Negative for back pain and neck pain.  Neurological:  Negative for weakness and numbness.  All other systems reviewed and are negative.   Physical Exam Updated Vital Signs BP (!) 142/80   Pulse 96   Temp 98.4 F (36.9 C)  (Oral)   Resp 16   Ht 5\' 2"  (1.575 m)   Wt 83.5 kg   LMP 06/24/2022 (Exact Date)   SpO2 100%   BMI 33.65 kg/m  Physical Exam Vitals and nursing note reviewed.  Constitutional:      General: She is not in acute distress.    Appearance: Normal appearance. She is well-developed.  HENT:     Head: Normocephalic and atraumatic.     Nose: Nose normal.  Eyes:     Pupils: Pupils are equal, round, and reactive to light.  Cardiovascular:     Rate and Rhythm: Normal rate and regular rhythm.     Pulses: Normal pulses.     Heart sounds: Normal heart sounds.  Pulmonary:     Effort: Pulmonary effort is normal. No respiratory distress.     Breath sounds: Normal breath sounds.  Abdominal:     General: Bowel sounds are normal. There is no distension.     Palpations: Abdomen is soft.     Tenderness: There is no abdominal tenderness. There is no guarding or rebound.  Genitourinary:    Vagina: No vaginal discharge.  Musculoskeletal:        General: Normal range of motion.     Right shoulder: No swelling, deformity, effusion, laceration, bony tenderness or crepitus.  Normal strength. Normal pulse.     Right upper arm: Normal.     Right elbow: Normal.     Right forearm: Normal.     Cervical back: Neck supple.     Comments: Negative Neers test, no winging of the scapula.  No warmth no erythema or fluctuance   Skin:    General: Skin is warm and dry.     Capillary Refill: Capillary refill takes less than 2 seconds.     Findings: No erythema or rash.  Neurological:     General: No focal deficit present.     Mental Status: She is alert.     Deep Tendon Reflexes: Reflexes normal.  Psychiatric:        Mood and Affect: Mood normal.     ED Results / Procedures / Treatments   Labs (all labs ordered are listed, but only abnormal results are displayed) Labs Reviewed - No data to display  EKG None  Radiology No results found.  Procedures Procedures    Medications Ordered in  ED Medications  ketorolac (TORADOL) injection 30 mg (has no administration in time range)  naproxen (NAPROSYN) tablet 500 mg (500 mg Oral Given 05/04/23 0614)  dexamethasone (DECADRON) injection 10 mg (10 mg Intramuscular Given 05/04/23 0617)  methocarbamol (ROBAXIN) tablet 500 mg (500 mg Oral Given 05/04/23 1610)    ED Course/ Medical Decision Making/ A&P                             Medical Decision Making Amount and/or Complexity of Data Reviewed Radiology: ordered.  Risk Prescription drug management.   Final Clinical Impression(s) / ED Diagnoses Final diagnoses:  Acute pain of right shoulder   Return for intractable cough, coughing up blood, fevers > 100.4 unrelieved by medication, shortness of breath, intractable vomiting, chest pain, shortness of breath, weakness, numbness, changes in speech, facial asymmetry, abdominal pain, passing out, Inability to tolerate liquids or food, cough, altered mental status or any concerns. No signs of systemic illness or infection. The patient is nontoxic-appearing on exam and vital signs are within normal limits.  I have reviewed the triage vital signs and the nursing notes. Pertinent labs & imaging results that were available during my care of the patient were reviewed by me and considered in my medical decision making (see chart for details). After history, exam, and medical workup I feel the patient has been appropriately medically screened and is safe for discharge home. Pertinent diagnoses were discussed with the patient. Patient was given return precautions.  Rx / DC Orders ED Discharge Orders     None         Neziah Vogelgesang, MD 05/04/23 [phone]

## 2023-05-06 ENCOUNTER — Ambulatory Visit (INDEPENDENT_AMBULATORY_CARE_PROVIDER_SITE_OTHER): Admitting: Family Medicine

## 2023-05-06 VITALS — BP 131/81 | Ht 62.0 in | Wt 184.0 lb

## 2023-05-06 DIAGNOSIS — M25511 Pain in right shoulder: Secondary | ICD-10-CM | POA: Diagnosis not present

## 2023-05-06 MED ORDER — MELOXICAM 15 MG PO TABS: 15.0000 mg | ORAL_TABLET | Freq: Every day | ORAL | 1 refills | Status: DC

## 2023-05-06 NOTE — Progress Notes (Signed)
PCP: Loyola Mast, MD  Subjective:   HPI: Patient is a 47 y.o. female here for right shoulder pain.  Patient reports on Sunday she developed very severe right shoulder pain. No acute injury but she did more work around the house prior to this hanging blinds, other activities with overhead motions and reaching. She went to ED due to severity - given toradol and decadron which helped temporarily. Pain is all around the right shoulder. Tried icing, biofreeze. No numbness or tingling. No neck pain. Had previous shoulder arthroscopy with what sounds like acromioplasty but no rotator cuff repair.  Past Medical History:  Diagnosis Date   Anxiety    on meds   Cerebral venous thrombosis 2022   treated with Eliquis-stopped med in Feb 2023   Depression    on meds   Ectopic pregnancy    Family history of prostate cancer 02/03/2022   Family history of uterine cancer 02/03/2022   Migraine    Osteoarthritis    bilateral knees   Plantar fasciitis    PTSD (post-traumatic stress disorder)    on meds   Stroke (HCC)    2022-cerebral venous thrombosis    Current Outpatient Medications on File Prior to Visit  Medication Sig Dispense Refill   acetaminophen (TYLENOL) 325 MG tablet Take 2 tablets (650 mg total) by mouth every 6 (six) hours as needed for fever or mild pain.     aspirin EC 81 MG tablet Take 81 mg by mouth daily. Swallow whole.     buPROPion (WELLBUTRIN XL) 150 MG 24 hr tablet Take 300 mg by mouth daily.     escitalopram (LEXAPRO) 10 MG tablet Take 10 mg by mouth at bedtime.     No current facility-administered medications on file prior to visit.    Past Surgical History:  Procedure Laterality Date   COLONOSCOPY  2015   in [employer]-normal per pt   ECTOPIC PREGNANCY SURGERY Left 2001   fallopian tube removed due to ectopic pregnancy   ECTOPIC PREGNANCY SURGERY Right 2004   fallopian tube removed due to ectopic pregnancy   SHOULDER ARTHROSCOPY Right 2017   removed bone  spur and shaved off bone bursa    No Known Allergies  BP 131/81   Ht 5\' 2"  (1.575 m)   Wt 184 lb (83.5 kg)   LMP 06/24/2022 (Exact Date)   BMI 33.65 kg/m       No data to display              No data to display              Objective:  Physical Exam:  Gen: NAD, comfortable in exam room  Right shoulder: No swelling, ecchymoses.  No gross deformity. TTP diffusely about shoulder including trapezius, pec. FROM with painful arc. Negative Hawkins, Neers. Negative Yergasons. Strength 5/5 with empty can and resisted internal/external rotation.  No pain with these motions. Negative apprehension. NV intact distally.   Assessment & Plan:  1. Right shoulder pain - 2/2 overuse strains of rotator cuff, trapezius, pec major.  Reassured patient.  Avoid reaching, overhead motions.  Meloxicam for 7-10 days then as needed.  Consider physical therapy.  F/u in 1 month or prn if doing well.

## 2023-05-06 NOTE — Patient Instructions (Signed)
You have strained the muscles around your shoulder (rotator cuff, trapezius pec). This should improve from this point though. Try to avoid painful activities (overhead activities, lifting with extended arm) as much as possible. Meloxicam 15mg  daily with food for pain and inflammation - take for 7-10 days then as needed. Can take tylenol in addition to this. Consider physical therapy with transition to home exercise program. Do home exercise program with theraband and scapular stabilization exercises daily 3 sets of 10 once a day. Follow up with me in 1 month or as needed if you're doing well.

## 2023-06-19 LAB — HM MAMMOGRAPHY

## 2023-06-22 ENCOUNTER — Encounter: Payer: Self-pay | Admitting: Family Medicine

## 2023-06-30 ENCOUNTER — Encounter: Payer: Self-pay | Admitting: Family Medicine

## 2024-07-01 LAB — HM MAMMOGRAPHY

## 2024-08-06 ENCOUNTER — Encounter (HOSPITAL_BASED_OUTPATIENT_CLINIC_OR_DEPARTMENT_OTHER): Payer: Self-pay

## 2024-08-06 ENCOUNTER — Emergency Department (HOSPITAL_BASED_OUTPATIENT_CLINIC_OR_DEPARTMENT_OTHER): Admission: EM | Admit: 2024-08-06 | Discharge: 2024-08-06 | Disposition: A | Discharge status: Home / Self Care

## 2024-08-06 DIAGNOSIS — Z7982 Long term (current) use of aspirin: Secondary | ICD-10-CM | POA: Diagnosis not present

## 2024-08-06 DIAGNOSIS — R030 Elevated blood-pressure reading, without diagnosis of hypertension: Secondary | ICD-10-CM | POA: Insufficient documentation

## 2024-08-06 DIAGNOSIS — I159 Secondary hypertension, unspecified: Secondary | ICD-10-CM

## 2024-08-06 NOTE — Discharge Instructions (Signed)
 As discussed please keep a log of blood pressures and follow-up with your primary doctor regarding initiation of antihypertensive medications.  Return if you develop sudden onset headache, vision loss, facial droop, unilateral weakness, chest pain, shortness of breath, abdominal pain, stop making urine or any new or worsening symptoms that are concerning to you.

## 2024-08-06 NOTE — ED Provider Notes (Signed)
 [place] [facility] Provider Note   CSN: [id number] Arrival date & time: 08/06/24  1044     Patient presents with: Hypertension   Jasmine [name] is a 48 y.o. female.   48 year old female presenting emergency department for evaluation of hypertension.  Was at work health fair and had high blood pressure 147/99.  Asymptomatic.  No headache, stroke symptoms, chest pain, shortness of breath or urinary symptoms.  No history of hypertension.   Hypertension       Prior to Admission medications   Medication Sig Start Date End Date Taking? Authorizing Provider  acetaminophen  (TYLENOL ) 325 MG tablet Take 2 tablets (650 mg total) by mouth every 6 (six) hours as needed for fever or mild pain. 03/11/21   Danton Reyes DASEN, MD  aspirin EC 81 MG tablet Take 81 mg by mouth daily. Swallow whole.    [provider]  buPROPion  (WELLBUTRIN  XL) 150 MG 24 hr tablet Take 300 mg by mouth daily.    [provider]  escitalopram  (LEXAPRO ) 10 MG tablet Take 10 mg by mouth at bedtime.    [provider]  meloxicam  (MOBIC ) 15 MG tablet Take 1 tablet (15 mg total) by mouth daily. 05/06/23   Cleatrice Ludie SAUNDERS, MD    Allergies: Patient has no known allergies.    Review of Systems  Updated Vital Signs BP (!) 136/90 (BP Location: Right Arm)   Pulse 84   Temp 98.4 F (36.9 C) (Oral)   Resp 18   Ht 5' 2 (1.575 m)   Wt 84.8 kg   LMP 06/24/2022 (Exact Date)   SpO2 100%   BMI 34.20 kg/m   Physical Exam Vitals and nursing note reviewed.  Constitutional:      General: She is not in acute distress.    Appearance: She is not toxic-appearing.  HENT:     Nose: Nose normal.  Cardiovascular:     Rate and Rhythm: Normal rate.  Pulmonary:     Effort: Pulmonary effort is normal.  Abdominal:     General: Abdomen is flat. There is no distension.     Palpations: Abdomen is soft.     Tenderness: There is no abdominal tenderness. There is no  guarding or rebound.  Skin:    General: Skin is warm and dry.     Capillary Refill: Capillary refill takes less than 2 seconds.  Neurological:     Mental Status: She is alert.  Psychiatric:        Behavior: Behavior normal.     (all labs ordered are listed, but only abnormal results are displayed) Labs Reviewed - No data to display  EKG: None  Radiology: No results found.   Procedures   Medications Ordered in the ED - No data to display                                  Medical Decision Making This is a 48 year old female presenting emergency department for elevated blood pressure.  She is afebrile nontachycardic, she is 136/90.  She is asymptomatic.  Per ACEs guidelines on asymptomatic hypertension no acute intervention needed.  Discussed follow-up with primary doctor and keeping a log of blood pressures.  Will discharge in stable condition.      Final diagnoses:  None    ED Discharge Orders     None  Neysa Caron PARAS, DO 08/06/24 1222

## 2024-08-06 NOTE — ED Triage Notes (Signed)
 Was at a resource fair and had her BP checked and it was  elevated at 147/99. Denies hx of hypertension. Denies symptoms, here to be evaluated.

## 2024-08-08 ENCOUNTER — Ambulatory Visit: Payer: Self-pay

## 2024-08-08 NOTE — Telephone Encounter (Signed)
Left VM to rtn call. Dm/cma       

## 2024-08-08 NOTE — Telephone Encounter (Signed)
 Copied from CRM #[id number]. Topic: Clinical - Red Word Triage >> Aug 08, 2024  9:37 AM Gennette ORN wrote: Red Word that prompted transfer to Nurse Triage: Patient went to do a medical screening and her blood pressure was higher than normal. She is scheduled for follow up appointment. But she wants to know what should she do until the meantime. Reason for Disposition  Healthy diet, questions about  Answer Assessment - Initial Assessment Questions Pt has hospital f/u on 9/3 and wanted home care tips to manage BP. This morning's reading was 136/84.       1. BLOOD PRESSURE: What is your blood pressure? Did you take at least two measurements 5 minutes apart?     136/84 2. ONSET: When did you take your blood pressure?     This morning 3. HOW: How did you take your blood pressure? (e.g., automatic home BP monitor, visiting nurse)     automatic 4. HISTORY: Do you have a history of high blood pressure?     no 5. MEDICINES: Are you taking any medicines for blood pressure? Have you missed any doses recently?     no 6. OTHER SYMPTOMS: Do you have any symptoms? (e.g., blurred vision, chest pain, difficulty breathing, headache, weakness)     denies  Protocols used: Blood Pressure - High-A-AH

## 2024-08-09 NOTE — Telephone Encounter (Signed)
 Left detailed VM to call us  back if she wants a sooner appointment then scheduled. Dm/cma

## 2024-08-17 ENCOUNTER — Ambulatory Visit: Admitting: Family Medicine

## 2024-08-17 ENCOUNTER — Encounter: Payer: Self-pay | Admitting: Family Medicine

## 2024-08-17 ENCOUNTER — Ambulatory Visit: Payer: Self-pay | Admitting: Family Medicine

## 2024-08-17 VITALS — BP 122/84 | HR 81 | Temp 97.8°F | Ht 62.0 in | Wt 191.8 lb

## 2024-08-17 DIAGNOSIS — F419 Anxiety disorder, unspecified: Secondary | ICD-10-CM

## 2024-08-17 DIAGNOSIS — R030 Elevated blood-pressure reading, without diagnosis of hypertension: Secondary | ICD-10-CM | POA: Insufficient documentation

## 2024-08-17 DIAGNOSIS — Z23 Encounter for immunization: Secondary | ICD-10-CM

## 2024-08-17 LAB — BASIC METABOLIC PANEL WITH GFR
BUN: 13 mg/dL (ref 6–23)
CO2: 28 meq/L (ref 19–32)
Calcium: 9.2 mg/dL (ref 8.4–10.5)
Chloride: 104 meq/L (ref 96–112)
Creatinine, Ser: 1.01 mg/dL (ref 0.40–1.20)
GFR: 64.87 mL/min (ref >60.00)
Glucose, Bld: 72 mg/dL (ref 70–99)
Potassium: 3.9 meq/L (ref 3.5–5.1)
Sodium: 140 meq/L (ref 135–145)

## 2024-08-17 LAB — CBC
HCT: 37.6 % (ref 36.0–46.0)
Hemoglobin: 12.6 g/dL (ref 12.0–15.0)
MCHC: 33.5 g/dL (ref 30.0–36.0)
MCV: 88.4 fl (ref 78.0–100.0)
Platelets: 279 K/uL (ref 150.0–400.0)
RBC: 4.25 Mil/uL (ref 3.87–5.11)
RDW: 14.4 % (ref 11.5–15.5)
WBC: 3.6 K/uL — ABNORMAL LOW (ref 4.0–10.5)

## 2024-08-17 LAB — TSH: TSH: 1.39 u[IU]/mL (ref 0.35–5.50)

## 2024-08-17 MED ORDER — ESCITALOPRAM OXALATE 10 MG PO TABS: 10.0000 mg | ORAL_TABLET | Freq: Every day | ORAL | 3 refills | Status: AC

## 2024-08-17 MED ORDER — BUPROPION HCL ER (XL) 150 MG PO TB24: 300.0000 mg | ORAL_TABLET | Freq: Every day | ORAL | 3 refills | Status: AC

## 2024-08-17 NOTE — Patient Instructions (Signed)

## 2024-08-17 NOTE — Assessment & Plan Note (Signed)
 I will renew her bupropion  and escitalopram  today. She will follow-up with behavioral health.

## 2024-08-17 NOTE — Assessment & Plan Note (Signed)
 This appears to have been an acute stress reaction rather than chronic hypertension. I will check labs to assess for other possible contributors. We discussed non-pharmacologic approaches to reducing blood pressure, including her reviewing a DASH diet. I will have her monitor this at home for now and return if her average BP is > 135/85.

## 2024-08-17 NOTE — Progress Notes (Signed)
 [facility] [address] [place] [address] Dept: [phone] Dept Fax: [phone]  Office Visit  Subjective:    Patient ID: Jasmine [name], female    DOB: 10/13/1976, 48 y.o..   MRN: [id number]  Chief Complaint  Patient presents with   Hospitalization Follow-up    Hospital f/u for elevated BP (08/06/24).    BP averaging 124/74 -138/84.    History of Present Illness:  Patient is in today for follow-up from a recent ED visit. Ms. Schul notes she had been under some undue family stressors lately. On 8/23, there was a health fair at her work. She had her blood pressure checked and it was 144/99. 5 minutes later, it remained elevated. She was seen at [facility]. She denies having had any symptoms of chest pain or headache. Her pressure was 136/90 at presentation. Since then, she has been monitoring this at home. She has seen this range from 124/74 up to 138/84.  Ms. Nachreiner has a history of anxiety and PTSD. She has been managed on bupropion  XL 150 mg daily and escitalopram  (Lexapro ) 10 mg daily. There has been a transition in her [facility] program. She will not be able to see her provider until alter this week, but is out of her meds.  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 early 26s; mets   Cancer Paternal Aunt        unknown type; d. 17s   Brain cancer Paternal Uncle        d. <60   Cancer Paternal Uncle        unknown type; dx unknown age   Lung cancer Maternal Grandmother        dx 19s; smoking hx   Arthritis Maternal Grandmother    Cancer Maternal Grandmother    Prostate cancer Maternal Grandfather        d. mid-late 63s; mets   Cancer Maternal Grandfather    Cancer Paternal Grandmother        unknown type; mets; d. late 60s-early 70s   Diabetes Paternal Grandfather    Obesity Paternal Grandfather    Alcohol abuse Maternal Uncle    Asthma Maternal Uncle    Drug abuse Maternal Uncle    Drug abuse Paternal Uncle    Colon polyps Neg Hx    Colon cancer Neg Hx  Esophageal cancer Neg Hx    Stomach cancer Neg Hx    Rectal cancer Neg Hx    Outpatient Medications Prior to Visit  Medication Sig Dispense Refill   acetaminophen  (TYLENOL ) 325 MG tablet Take 2 tablets (650 mg total) by mouth every 6 (six) hours as needed for fever or mild pain.     aspirin EC 81 MG tablet Take 81 mg by mouth daily. Swallow whole.     buPROPion  (WELLBUTRIN  XL) 150 MG 24 hr tablet Take 300 mg by mouth daily.     escitalopram  (LEXAPRO ) 10 MG tablet Take 10 mg by mouth at bedtime.     meloxicam  (MOBIC ) 15 MG tablet Take 1 tablet (15 mg total) by mouth daily. 30 tablet 1   No facility-administered medications prior to visit.   No Known Allergies   Objective:   Today's Vitals   08/17/24 1041  BP: 122/84  Pulse: 81  Temp: 97.8 F (36.6 C)  TempSrc: Temporal  SpO2: 99%  Weight: 191 lb 12.8 oz (87  kg)  Height: 5' 2 (1.575 m)   Body mass index is 35.08 kg/m.   General: Well developed, well nourished. No acute distress. Psych: Alert and oriented. Normal mood and affect.  Health Maintenance Due  Topic Date Due   Hepatitis C Screening  Never done   Hepatitis B Vaccines 19-59 Average Risk (1 of 3 - 19+ 3-dose series) Never done   Zoster Vaccines- Shingrix (1 of 2) Never done     Assessment & Plan:   Problem List Items Addressed This Visit       Other   Anxiety   I will renew her bupropion  and escitalopram  today. She will follow-up with behavioral health.      Relevant Medications   escitalopram  (LEXAPRO ) 10 MG tablet   buPROPion  (WELLBUTRIN  XL) 150 MG 24 hr tablet   Elevated blood-pressure reading, without diagnosis of hypertension - Primary   This appears to have been an acute stress reaction rather than chronic hypertension. I will check labs to assess for other possible contributors. We discussed non-pharmacologic approaches to reducing blood pressure, including her reviewing a DASH diet. I will have her monitor this at home for now and return if her average BP is > 135/85.      Relevant Orders   Basic metabolic panel with GFR   TSH   CBC   Other Visit Diagnoses       Need for immunization against influenza       Relevant Orders   Flu vaccine trivalent PF, 6mos and older(Flulaval,Afluria,Fluarix,Fluzone) (Completed)     Need for pneumococcal 20-valent conjugate vaccination       Relevant Orders   Pneumococcal conjugate vaccine 20-valent (Completed)       Return in about 6 months (around 02/14/2025) for Annual preventative care.   Garnette CHRISTELLA Simpler, MD

## 2024-09-28 LAB — AMB RESULTS CONSOLE CBG: Glucose: 102

## 2024-09-28 NOTE — Progress Notes (Signed)
 Pt has PCP. Pt has no SDOH needs at this time

## 2024-11-15 NOTE — Progress Notes (Unsigned)
 The patient attended a screening event on 09/28/2024, where her blood pressure was measured at 130/86 mmHg, and her blood glucose was 102 mg/dl. During the event, the patient reported that she does not smoke, has [employer] for her insurance, is/is'n't established with a primary care provider (Dr. Garnette Simpler), and she does not have any SDOH needs indicated at this time.   A chart review indicates Dr. Garnette Simpler - [facility]. The patient's most recent office visit was on 08/17/2024. Her PCP is aware of her bp history and noted that her readings have been stress related and not relating to having hypertension. He instructed her to consider the DASH diet where he provided her education and to monitor readings at home then return if they continue to be over 135/85 per visit notes. She does not have any upcoming appts indicated with her PCP at this time. She has [employer] for her insurance and no SDOH needs indicated at this time.   Call Attempt #1: CHW called pt to discuss screening results. CHW left vm for pt to return call.  Call Attempt #2: CHW called pt again to discuss screening results. CHW left vm for pt to return call due to pt not answering.  At this time, no additional support from the Health [facility] Team is indicated.
